# Patient Record
Sex: Female | Born: 1975 | Race: White | Hispanic: No | Marital: Married | Smoking: Never smoker
Health system: Southern US, Community
[De-identification: ages and names within clinical notes are randomized; demographics above are authoritative.]

## PROBLEM LIST (undated history)

## (undated) ENCOUNTER — Ambulatory Visit

## (undated) DIAGNOSIS — E538 Deficiency of other specified B group vitamins: Secondary | ICD-10-CM

## (undated) DIAGNOSIS — K219 Gastro-esophageal reflux disease without esophagitis: Secondary | ICD-10-CM

## (undated) DIAGNOSIS — I2585 Chronic coronary microvascular dysfunction: Secondary | ICD-10-CM

## (undated) DIAGNOSIS — G4730 Sleep apnea, unspecified: Secondary | ICD-10-CM

## (undated) DIAGNOSIS — I519 Heart disease, unspecified: Secondary | ICD-10-CM

## (undated) DIAGNOSIS — R011 Cardiac murmur, unspecified: Secondary | ICD-10-CM

## (undated) DIAGNOSIS — G43909 Migraine, unspecified, not intractable, without status migrainosus: Secondary | ICD-10-CM

## (undated) DIAGNOSIS — I509 Heart failure, unspecified: Secondary | ICD-10-CM

## (undated) DIAGNOSIS — J90 Pleural effusion, not elsewhere classified: Secondary | ICD-10-CM

## (undated) DIAGNOSIS — F32A Depression, unspecified: Secondary | ICD-10-CM

## (undated) DIAGNOSIS — G47419 Narcolepsy without cataplexy: Secondary | ICD-10-CM

## (undated) DIAGNOSIS — T7840XA Allergy, unspecified, initial encounter: Secondary | ICD-10-CM

## (undated) DIAGNOSIS — B019 Varicella without complication: Secondary | ICD-10-CM

## (undated) DIAGNOSIS — I10 Essential (primary) hypertension: Secondary | ICD-10-CM

## (undated) DIAGNOSIS — D649 Anemia, unspecified: Secondary | ICD-10-CM

## (undated) DIAGNOSIS — E079 Disorder of thyroid, unspecified: Secondary | ICD-10-CM

## (undated) DIAGNOSIS — R911 Solitary pulmonary nodule: Secondary | ICD-10-CM

## (undated) DIAGNOSIS — C801 Malignant (primary) neoplasm, unspecified: Secondary | ICD-10-CM

## (undated) DIAGNOSIS — G709 Myoneural disorder, unspecified: Secondary | ICD-10-CM

## (undated) DIAGNOSIS — Z8542 Personal history of malignant neoplasm of other parts of uterus: Secondary | ICD-10-CM

## (undated) DIAGNOSIS — I2589 Other forms of chronic ischemic heart disease: Secondary | ICD-10-CM

## (undated) DIAGNOSIS — G4733 Obstructive sleep apnea (adult) (pediatric): Secondary | ICD-10-CM

## (undated) DIAGNOSIS — F419 Anxiety disorder, unspecified: Secondary | ICD-10-CM

## (undated) DIAGNOSIS — F329 Major depressive disorder, single episode, unspecified: Secondary | ICD-10-CM

## (undated) HISTORY — DX: Pleural effusion, not elsewhere classified: J90

## (undated) HISTORY — PX: ABDOMINAL HYSTERECTOMY: SHX81

## (undated) HISTORY — DX: Solitary pulmonary nodule: R91.1

## (undated) HISTORY — DX: Migraine, unspecified, not intractable, without status migrainosus: G43.909

## (undated) HISTORY — DX: Heart disease, unspecified: I51.9

## (undated) HISTORY — DX: Chronic coronary microvascular dysfunction: I25.85

## (undated) HISTORY — DX: Depression, unspecified: F32.A

## (undated) HISTORY — DX: Major depressive disorder, single episode, unspecified: F32.9

## (undated) HISTORY — DX: Myoneural disorder, unspecified: G70.9

## (undated) HISTORY — DX: Anemia, unspecified: D64.9

## (undated) HISTORY — DX: Allergy, unspecified, initial encounter: T78.40XA

## (undated) HISTORY — DX: Essential (primary) hypertension: I10

## (undated) HISTORY — DX: Varicella without complication: B01.9

## (undated) HISTORY — DX: Heart failure, unspecified: I50.9

## (undated) HISTORY — DX: Cardiac murmur, unspecified: R01.1

## (undated) HISTORY — PX: CHOLECYSTECTOMY: SHX55

## (undated) HISTORY — DX: Obstructive sleep apnea (adult) (pediatric): G47.33

## (undated) HISTORY — DX: Other forms of chronic ischemic heart disease: I25.89

## (undated) HISTORY — DX: Narcolepsy without cataplexy: G47.419

## (undated) HISTORY — DX: Deficiency of other specified B group vitamins: E53.8

## (undated) HISTORY — DX: Malignant (primary) neoplasm, unspecified: C80.1

## (undated) HISTORY — DX: Sleep apnea, unspecified: G47.30

## (undated) HISTORY — DX: Gastro-esophageal reflux disease without esophagitis: K21.9

## (undated) HISTORY — DX: Personal history of malignant neoplasm of other parts of uterus: Z85.42

## (undated) HISTORY — DX: Disorder of thyroid, unspecified: E07.9

## (undated) HISTORY — DX: Anxiety disorder, unspecified: F41.9

---

## 2013-04-22 DIAGNOSIS — C541 Malignant neoplasm of endometrium: Secondary | ICD-10-CM

## 2013-04-22 HISTORY — DX: Malignant neoplasm of endometrium: C54.1

## 2013-04-22 HISTORY — PX: ABDOMINAL HYSTERECTOMY: SHX81

## 2015-02-01 ENCOUNTER — Ambulatory Visit (INDEPENDENT_AMBULATORY_CARE_PROVIDER_SITE_OTHER): Payer: 59 | Admitting: Family Medicine

## 2015-02-01 ENCOUNTER — Encounter: Payer: Self-pay | Admitting: Family Medicine

## 2015-02-01 VITALS — BP 122/78 | HR 76 | Ht 66.75 in | Wt 287.8 lb

## 2015-02-01 DIAGNOSIS — F329 Major depressive disorder, single episode, unspecified: Secondary | ICD-10-CM | POA: Diagnosis not present

## 2015-02-01 DIAGNOSIS — Z803 Family history of malignant neoplasm of breast: Secondary | ICD-10-CM

## 2015-02-01 DIAGNOSIS — Z9989 Dependence on other enabling machines and devices: Secondary | ICD-10-CM

## 2015-02-01 DIAGNOSIS — Z7689 Persons encountering health services in other specified circumstances: Secondary | ICD-10-CM

## 2015-02-01 DIAGNOSIS — F32A Depression, unspecified: Secondary | ICD-10-CM

## 2015-02-01 DIAGNOSIS — Z7189 Other specified counseling: Secondary | ICD-10-CM | POA: Diagnosis not present

## 2015-02-01 DIAGNOSIS — Z23 Encounter for immunization: Secondary | ICD-10-CM | POA: Diagnosis not present

## 2015-02-01 DIAGNOSIS — Z1239 Encounter for other screening for malignant neoplasm of breast: Secondary | ICD-10-CM | POA: Diagnosis not present

## 2015-02-01 DIAGNOSIS — G4733 Obstructive sleep apnea (adult) (pediatric): Secondary | ICD-10-CM | POA: Diagnosis not present

## 2015-02-01 MED ORDER — ARMODAFINIL 250 MG PO TABS: 250.0000 mg | ORAL_TABLET | Freq: Every day | ORAL | Status: DC

## 2015-02-01 NOTE — Progress Notes (Signed)
   Subjective:    Patient ID: Jennifer Mayo, female    DOB: [birth year], 39 y.o.   MRN: [id number]  HPI She is new to the practice and here to establish primary care. She moved here from [place]. She has multiple complaints. Her husband is with her today and he has concerns about her health as well.  She states she has been seeing cardiologist in [place] and has had several cardiac MRIs and a cardiac cath in past 2 years. 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 She states she is unable to walk long distances or she has chest pain and DOE, this is ongoing. Reports she has gained 75 lbs in past due to this since she cannot exercise. She was in a study in [facility] for this. States she would like to be referred to a cardiologist who knows about microvascular CAD.  History of endometrial cancer and hysterectomy. States she still has left ovary.  Reports a "spot on her lung" that was not followed up on. History of thyroid disease and she took synthroid for a while but has not needed it in past 6 months. Family history (mother) of breast cancer.  Reports sleep apnea and uses CPAP nightly. States she takes Nuvigil for either narcolepsy or OSA related daytime sleepiness. She states she does not know what her official diagnosis is.   Reviewed allergies, medications, past medical, surgical, family, and social history.    Review of Systems Pertinent positives and negatives in HPI.    Objective:   Physical Exam  Alert and oriented and in no distress. Not otherwise examined.       Assessment & Plan:  OSA on CPAP  Encounter to establish care  Depression  Screening for breast cancer - Plan: MM DIGITAL SCREENING BILATERAL  FH: breast cancer in first degree relative  Morbid obesity, unspecified obesity type ([place])  Need for prophylactic vaccination and inoculation against influenza - Plan: Flu Vaccine QUAD 36+ mos IM  Discussed that I need to receive documentation from her  previous providers in [place] before being able to continue prescribing Nuvigil. I gave her a prescription today for #10 and will not refill without documentation. Discussed that she has multiple requests to see specialists and a complicated history that I will need time to review once I receive her records. Order placed for a screening mammogram today and she will call the breast center to schedule. Flu shot given.  Depression- states she is ok with her medication for this for now. Will discuss more at next visit. Will also address weight loss at next visit.  Spent at least 45 minutes with patient and at least 50% was counseling and coordination of care.

## 2015-02-06 ENCOUNTER — Telehealth: Payer: Self-pay | Admitting: Family Medicine

## 2015-02-06 NOTE — Telephone Encounter (Signed)
Records recv'd from [facility]

## 2015-02-13 ENCOUNTER — Encounter: Payer: Self-pay | Admitting: Family Medicine

## 2015-02-13 DIAGNOSIS — E538 Deficiency of other specified B group vitamins: Secondary | ICD-10-CM | POA: Insufficient documentation

## 2015-02-13 DIAGNOSIS — G47419 Narcolepsy without cataplexy: Secondary | ICD-10-CM | POA: Insufficient documentation

## 2015-02-13 DIAGNOSIS — I519 Heart disease, unspecified: Secondary | ICD-10-CM | POA: Insufficient documentation

## 2015-02-13 DIAGNOSIS — R911 Solitary pulmonary nodule: Secondary | ICD-10-CM | POA: Insufficient documentation

## 2015-02-13 DIAGNOSIS — G4733 Obstructive sleep apnea (adult) (pediatric): Secondary | ICD-10-CM | POA: Insufficient documentation

## 2015-02-14 ENCOUNTER — Telehealth: Payer: Self-pay | Admitting: Internal Medicine

## 2015-02-14 DIAGNOSIS — G47419 Narcolepsy without cataplexy: Secondary | ICD-10-CM

## 2015-02-14 DIAGNOSIS — G4733 Obstructive sleep apnea (adult) (pediatric): Secondary | ICD-10-CM

## 2015-02-14 NOTE — Telephone Encounter (Signed)
Jennifer Mayo asked me to refer pt over to a Doctor that would control pt's narcolepsy and OSA with cpap and i called [facility] and they said they would that to send a referral over. I have put the refferal into epic and they will call patient. Called and left a message for pt to call me back. (just to let her know that we are sending her to specialist to handle her narcolepsy med and OSA.

## 2015-02-15 NOTE — Telephone Encounter (Signed)
Spoke with patient to let her know what we sent a referral over to neurology so they can control her narcolepsy and sleep apnea as she is on meds for narcolepsy. Patient states that was under control and that was not her main concern as the cardiologist appt was. Pt will call to make an appt with cardiologist Dr. Einar Gip (a friend told her about him) and once she got that going she would look into going to neurology.   I am leaving referral into workqueue for pt and if they call her then she can make that decision if she wants to be seen by them now or not but we will not handle her meds for narcolepsy.

## 2015-02-21 ENCOUNTER — Encounter: Payer: Self-pay | Admitting: Family Medicine

## 2015-03-15 ENCOUNTER — Ambulatory Visit
Admission: RE | Admit: 2015-03-15 | Discharge: 2015-03-15 | Disposition: A | Discharge status: Home / Self Care | Payer: 59 | Source: Ambulatory Visit | Attending: Family Medicine | Admitting: Family Medicine

## 2015-03-15 ENCOUNTER — Ambulatory Visit (INDEPENDENT_AMBULATORY_CARE_PROVIDER_SITE_OTHER): Payer: 59 | Admitting: Neurology

## 2015-03-15 ENCOUNTER — Telehealth: Payer: Self-pay | Admitting: Neurology

## 2015-03-15 ENCOUNTER — Encounter: Payer: Self-pay | Admitting: Neurology

## 2015-03-15 VITALS — BP 118/82 | HR 84 | Resp 20 | Ht 66.0 in | Wt 289.0 lb

## 2015-03-15 DIAGNOSIS — Z9989 Dependence on other enabling machines and devices: Secondary | ICD-10-CM

## 2015-03-15 DIAGNOSIS — Z1239 Encounter for other screening for malignant neoplasm of breast: Secondary | ICD-10-CM

## 2015-03-15 DIAGNOSIS — E662 Morbid (severe) obesity with alveolar hypoventilation: Secondary | ICD-10-CM | POA: Diagnosis not present

## 2015-03-15 DIAGNOSIS — G4719 Other hypersomnia: Secondary | ICD-10-CM

## 2015-03-15 DIAGNOSIS — I5040 Unspecified combined systolic (congestive) and diastolic (congestive) heart failure: Secondary | ICD-10-CM | POA: Diagnosis not present

## 2015-03-15 DIAGNOSIS — G4733 Obstructive sleep apnea (adult) (pediatric): Secondary | ICD-10-CM

## 2015-03-15 MED ORDER — ARMODAFINIL 250 MG PO TABS: 250.0000 mg | ORAL_TABLET | Freq: Every day | ORAL | Status: DC

## 2015-03-15 NOTE — Telephone Encounter (Signed)
Patient is calling. She was seen in our office today and was given a Rx for Armodafinil (NUVIGIL) 250 MG tablet. The patient states the pharmacy says the medication needs prior authorization.form her insurance company. Thank you.

## 2015-03-15 NOTE — Progress Notes (Signed)
SLEEP MEDICINE CLINIC   Provider:  Larey Seat, M D  Referring Provider: Girtha Rm, NP Primary Care Physician:  Bonnita Nasuti, MD  Chief Complaint  Patient presents with  . New Patient (Initial Visit)    osa on cpap, on nuvigil, rm 35, with husband    HPI:  Jennifer Mayo is a 39 y.o. female , seen here as a referral from NP [facility] for a  sleep consultation, Jennifer Mayo was diagnosed with obstructive sleep apnea about 12 years ago. She has been compliant with CPAP for over a decade.  She had been seeing a cardiologist in [place] and has had several cardiac MRIs and a cardiac cath in the past 2 years. She was diagnosed as congestive heart failure stage II microvascular coronary artery disease and stable angina. This has limited her exercise tolerance significantly and she has gained weight over the last 2 years. She is short of breath usually walking distances less than 100 yards. She has also a history of endometrial cancer followed by a hysterectomy she still has the left ovary remaining. She has a history of hypothyroidism but over the last 8 months had not needed to take Synthroid.  Her sleep evaluation 12 years ago was independent of any cardiac history since it wasn't known them. She has used her CPAP nightly but she also has taking Nuvigil for remaining daytime fatigue. She got her last supplies for CPAP from [place] but the head gear is no longer working for her. And because of this ill fitting interface she has been unable to use her CPAP as she usually would. She has an average AHI of 1.8 with a CPAP setting of 11 cm water. She has used the machine about 60% compliant which is unusual for her. I would like to prescribe her new supplies so that she can resume compliant use of CPAP. We were able to get a download from her machine here in the office today and discuss the numbers.  Sleep habits are as follows: The marital bedroom is cool, quiet and dark. The patient tries to get about  8 hours of sleep and usually goes to bed by 10 PM. She falls asleep promptly. She sleeps through the night, rarely has any nocturia. She may wake up once or twice but not because of the urge to urinate. There is also no pain or air hungriness that wakes her. She shares a bedroom with her husband who has not reported any periodic limb movements kicking thrashing acting out dreams etc. She sleeps with the head elevated on 2 pillows, prefer sleeping on her left side. There is breakthrough snoring if she resumes a supine position. She has to arise at about 6 AM but often sleeps through the alarm. She wakes up with headaches frequently these are throbbing dull headaches. There is a concern that these are hypoxemia related headaches or may be hypercapnia. She has never experienced nocturnal clusters or been woken by a headache attack.    Sleep medical history and family sleep history: diagnosed with OSA 2004, in [place]. CHF diagnosed in [place].   Social history: married, non smoker, non drinker, one cup of coffee twice a week.   Review of Systems: Out of a complete 14 system review, the patient complains of only the following symptoms, and all other reviewed systems are negative.  The patient endorsed the Epworth score at 15 points this has to be looked at in context with her CPAP use currently. She is using  Nuvigil in daytime and will need refills. She is using no other stimulant medications. Her fatigue severity score was 58 which can be related to her cardiac disease. Review of systems otherwise sleeping snoring, some memory loss, headaches, dizziness, syncope, depression, decreased level of energy, aching muscles, flushing, blurred vision, chest pain and swelling in the legs. Weight gain.    Social History   Social History  . Marital Status: Married    Spouse Name: N/A  . Number of Children: N/A  . Years of Education: N/A   Occupational History  . Not on file.   Social History Main Topics    . Smoking status: Never Smoker   . Smokeless tobacco: Not on file  . Alcohol Use: No  . Drug Use: No  . Sexual Activity: Not on file   Other Topics Concern  . Not on file   Social History Narrative   One cup of caffeine daily.    Family History  Problem Relation Age of Onset  . Cancer Mother   . Brain cancer Mother   . Seizures Mother   . Stroke Mother   . Diabetes Father     Past Medical History  Diagnosis Date  . History of endometrial cancer     had hysterectomy in August 2015  . Thyroid disease   . Depression   . Obstructive sleep apnea   . Mild diastolic dysfunction   . Anemia   . Cancer [facility])     endometrial  . Solitary pulmonary nodule on lung CT     follow up CT recommended per medical records  . Narcolepsy   . Vitamin B 12 deficiency   . Cardiac microvascular disease ([place])   . CHF (congestive heart failure) ([place])   . Pleural effusion, bilateral     Past Surgical History  Procedure Laterality Date  . Cholecystectomy    . Abdominal hysterectomy      Current Outpatient Prescriptions  Medication Sig Dispense Refill  . Armodafinil (NUVIGIL) 250 MG tablet Take 1 tablet (250 mg total) by mouth daily. 10 tablet 0  . aspirin 81 MG tablet Take 81 mg by mouth daily.    . carvedilol (COREG) 6.25 MG tablet Take 6.25 mg by mouth 2 (two) times daily with a meal.    . Cholecalciferol (VITAMIN D3) 5000 UNITS CAPS Take by mouth.    . citalopram (CELEXA) 20 MG tablet Take 40 mg by mouth daily.     . Magnesium 400 MG CAPS Take 1 tablet by mouth 2 (two) times daily.    . Probiotic Product (PROBIOTIC DAILY PO) Take 1 tablet by mouth daily.    Marland Kitchen Specialty Vitamins Products (MAGNESIUM, AMINO ACID CHELATE,) 133 MG tablet Take 1 tablet by mouth 2 (two) times daily.    Marland Kitchen spironolactone (ALDACTONE) 50 MG tablet Take 50 mg by mouth daily.    . TURMERIC PO Take 1 tablet by mouth 2 (two) times daily.    . vitamin B-12 (CYANOCOBALAMIN) 1000 MCG tablet Take 1,000 mcg by mouth  daily.     No current facility-administered medications for this visit.    Allergies as of 03/15/2015 - Review Complete 03/15/2015  Allergen Reaction Noted  . Ranexa [ranolazine]  03/15/2015  . Verapamil Other (See Comments) 02/01/2015    Vitals: There were no vitals taken for this visit. Last Weight:  Wt Readings from Last 1 Encounters:  02/01/15 287 lb 12.8 oz (130.545 kg)   [id number] is no weight on file to calculate  BMI.     Last Height:   Ht Readings from Last 1 Encounters:  02/01/15 5' 6.75" (1.695 m)    Physical exam:  General: The patient is awake, alert and appears not in acute distress. The patient is well groomed. Head: Normocephalic, atraumatic. Neck is supple. Mallampati 3,  neck circumference:17. Nasal airflow unrestricted, TMJ not evident . Retrognathia is not seen.  Cardiovascular:  Regular rate and rhythm, without distended neck veins. Respiratory: Lungs are clear to auscultation. Skin:  Without evidence of edema, or rash Trunk: BMI is elevated.    Neurologic exam : The patient is awake and alert, oriented to place and time.   Memory subjective described as intact.   Attention span & concentration ability appears normal. The described memory loss is poor fatigue related. Speech is fluent,  without dysarthria, dysphonia or aphasia.  Mood and affect are appropriate.  Cranial nerves: Pupils are equal and briskly reactive to light. Funduscopic exam without evidence of pallor or edema.  Extraocular movements  in vertical and horizontal planes intact and without nystagmus. Visual fields by finger perimetry are intact. Hearing to finger rub intact.   Facial sensation intact to fine touch.  Facial motor strength is symmetric and tongue and uvula move midline. Shoulder shrug was symmetrical.   Motor exam:   Normal tone, muscle bulk and symmetric strength in all extremities.  Sensory:  Fine touch, pinprick and vibration were tested in all extremities.  Proprioception tested in the upper extremities was normal.  Gait and station: Patient walks without assistive device and is able unassisted to climb up to the exam table. Strength within normal limits.  Stance is stable and normal.   Deep tendon reflexes: in the  upper and lower extremities are symmetric and intact. Babinski maneuver response is downgoing.  The patient was advised of the nature of the diagnosed sleep disorder , the treatment options and risks for general a health and wellness arising from not treating the condition.  I spent more than 40 minutes of face to face time with the patient. Greater than 50% of time was spent in counseling and coordination of care. We have discussed the diagnosis and differential and I answered the patient's questions.   When the patient was hospitalized it was observed that her oxygen levels at night were low in spite of using CPAP during that stay. I reviewed the patient's CPAP download which is not an accurate representation of her usual use. I would like for Mrs. Delafuente to return for a split night polysomnography. Her cardiac history was not even known by the time her last CPAP machine was issued, about 7 years ago. There has been no change in settings she is currently at 11 cm water which was the titration results of her last sleep study again 7 years ago. In addition her body mass index has increased. It may also be worthwhile to find a better fit fitting mask since many new models have been issued in the meantime. She is using the nasal whisp interface. In a hospitalization, a FFM was used, and she didn't like this.    Assessment:  After physical and neurologic examination, review of laboratory studies,  Personal review of imaging studies, reports of other /same  Imaging studies ,  Results of polysomnography/ neurophysiology testing and pre-existing records as far as provided in visit., my assessment is   1) obstructive sleep apnea diagnosed about 12  years ago currently using a CPAP machine that is about 40 years  old set at 11 cm water pressure. By the patient's residual AHI was still in good range at 1.8, her headgear and supplies have not been fitting her as well. I would like for her to be re-titrated for the simple reason that she has now a cardiac history with : A)congestive heart failure and microvascular ischemic heart disease, B) her elevated body mass index which was not present previously may require a different pressure setting for treatment.  I hope that we will find a comfortable setting for the patient and that in response to her excessive daytime sleepiness may be reduced as well.  C) hypoxemia and morning headaches are closely related.   D)The excessive daytime sleepiness can be related to her cardiac disease and may not be apnea dependent at all.  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   Plan:  Treatment plan and additional workup :  I will order a split night polysomnography, the patient has no primary pulmonary disease, but she wakes up with headaches which could be a sign of hypercapnia or hypoxemia for protracted periods at night.  Asencion Partridge Dennette Faulconer MD  03/15/2015   CC: Girtha Rm, [address] [place], [place] [id number]

## 2015-03-15 NOTE — Patient Instructions (Signed)
Armodafinil tablets What is this medicine? ARMODAFINIL (ar moe DAF i nil) is used to treat excessive sleepiness caused by certain sleep disorders. This includes narcolepsy, sleep apnea, and shift work sleep disorder. This medicine may be used for other purposes; ask your health care provider or pharmacist if you have questions. What should I tell my health care provider before I take this medicine? They need to know if you have any of these conditions: -kidney disease -liver disease -an unusual or allergic reaction to armodafinil, modafinil, medicines, foods, dyes, or preservatives -pregnant or trying to get pregnant -breast-feeding How should I use this medicine? Take this medicine by mouth with a glass of water. Follow the directions on the prescription label. Take your doses at regular intervals. Do not take your medicine more often than directed. Do not stop taking except on your doctor's advice. A special MedGuide will be given to you by the pharmacist with each prescription and refill. Be sure to read this information carefully each time. Talk to your pediatrician regarding the use of this medicine in children. While this drug may be prescribed for children as young as 42 years of age for selected conditions, precautions do apply. Overdosage: If you think you have taken too much of this medicine contact a poison control center or emergency room at once. NOTE: This medicine is only for you. Do not share this medicine with others. What if I miss a dose? If you miss a dose, take it as soon as you can. If it is almost time for your next dose, take only that dose. Do not take double or extra doses. What may interact with this medicine? Do not take this medicine with any of the following medications: -amphetamine or dextroamphetamine -dexmethylphenidate or methylphenidate -MAOIs like Carbex, Eldepryl, Marplan, Nardil, and Parnate -pemoline -procarbazine This medicine may also interact with  the following medications: -antifungal medicines like itraconazole or ketoconazole -barbiturates, like phenobarbital -birth control pills or other hormone-containing birth control devices or implants -carbamazepine -cyclosporine -diazepam -medicines for depression, anxiety, or psychotic disturbances -phenytoin -propranolol -triazolam -warfarin This list may not describe all possible interactions. Give your health care provider a list of all the medicines, herbs, non-prescription drugs, or dietary supplements you use. Also tell them if you smoke, drink alcohol, or use illegal drugs. Some items may interact with your medicine. What should I watch for while using this medicine? Visit your doctor or health care professional for regular checks on your progress. The full effect of this medicine may not be seen right away. This medicine may affect your concentration, function, or may hide signs that you are tired. You may get dizzy. Do not drive, use machinery, or do anything that needs mental alertness until you know how this drug affects you. Alcohol can make you more dizzy and may interfere with your response to this medicine or your alertness. Avoid alcoholic drinks. Birth control pills may not work properly while you are taking this medicine. Talk to your doctor about using an extra method of birth control. It is unknown if the effects of this medicine will be increased by the use of caffeine. Caffeine is found in many foods, beverages, and medications. Ask your doctor if you should limit or change your intake of caffeine-containing products while on this medicine. What side effects may I notice from receiving this medicine? Side effects that you should report to your doctor or health care professional as soon as possible: -allergic reactions like skin rash, itching or hives,  swelling of the face, lips, or tongue -breathing problems -chest pain -fast, irregular heartbeat -increased blood  pressure, particularly if you have high blood pressure -mental problems -sore throat, fever, or chills -tremors -vomiting Side effects that usually do not require medical attention (report to your doctor or health care professional if they continue or are bothersome): -headache -nausea, diarrhea, or stomach upset -nervousness -trouble sleeping This list may not describe all possible side effects. Call your doctor for medical advice about side effects. You may report side effects to FDA at 1-800-FDA-1088. Where should I keep my medicine? Keep out of the reach of children. Store at room temperature between 20 and 25 degrees C (68 and 77 degrees F). Throw away any unused medicine after the expiration date. NOTE: This sheet is a summary. It may not cover all possible information. If you have questions about this medicine, talk to your doctor, pharmacist, or health care provider.    2016, Elsevier/Gold Standard. (2013-12-28 14:57:07)

## 2015-03-15 NOTE — Telephone Encounter (Signed)
Ins has been contacted and provided with clinical info.  Request is currently under review Ref # [id number]

## 2015-03-21 NOTE — Telephone Encounter (Signed)
Patient's husband is calling to see if wife's Jennifer Mayo has been approved.  Thanks!

## 2015-03-21 NOTE — Telephone Encounter (Signed)
I called ins to follow up.  Spoke with Abigail Butts.  She said this request is still under review.    [employer] [facility]) has approved the request for coverage on Armodafinil (Nuvigil) effective until 03/14/2016 Ref # [id number]  I called the patient back to advise.  Got no answer.  Left message.

## 2015-04-11 ENCOUNTER — Ambulatory Visit (INDEPENDENT_AMBULATORY_CARE_PROVIDER_SITE_OTHER): Payer: 59 | Admitting: Neurology

## 2015-04-11 DIAGNOSIS — G4719 Other hypersomnia: Secondary | ICD-10-CM

## 2015-04-11 DIAGNOSIS — Z9989 Dependence on other enabling machines and devices: Secondary | ICD-10-CM

## 2015-04-11 DIAGNOSIS — G4733 Obstructive sleep apnea (adult) (pediatric): Secondary | ICD-10-CM

## 2015-04-11 DIAGNOSIS — E662 Morbid (severe) obesity with alveolar hypoventilation: Secondary | ICD-10-CM

## 2015-04-11 DIAGNOSIS — I5040 Unspecified combined systolic (congestive) and diastolic (congestive) heart failure: Secondary | ICD-10-CM

## 2015-04-12 NOTE — Sleep Study (Signed)
Please see the scanned sleep study interpretation located in the Procedure tab within the Chart Review section. 

## 2015-04-19 ENCOUNTER — Telehealth: Payer: Self-pay

## 2015-04-19 DIAGNOSIS — G4733 Obstructive sleep apnea (adult) (pediatric): Secondary | ICD-10-CM

## 2015-04-19 NOTE — Telephone Encounter (Signed)
Spoke to pt regarding her sleep study results. I advised pt that her sleep study revealed osa with supine accentuation and without hypoxemia. Dr. Brett Fairy recommends starting an auto PAP between 5 and 10 cm H2O with 1 cm H2O EPR. Pt has a CPAP at home, but it is very old. Pt is agreeable to starting new cpap. Will send to [facility]. I advised pt to use the cpap at least four or more hours per night. I advised her to lose weight, diet, and exercise if not contraindicated by her other physicians. I advised pt to avoid driving or operating hazardous machinery while sleepy. Pt verbalized understanding. A follow up appt was made for 3/1 at 2:30 for insurance purposes.

## 2015-06-21 ENCOUNTER — Ambulatory Visit: Payer: Self-pay | Admitting: Neurology

## 2015-06-21 ENCOUNTER — Telehealth: Payer: Self-pay

## 2015-06-21 NOTE — Telephone Encounter (Signed)
Pt did not show for their appt with Dr. Brett Fairy today due to a death in the family.

## 2015-06-22 ENCOUNTER — Encounter: Payer: Self-pay | Admitting: Neurology

## 2015-07-25 ENCOUNTER — Ambulatory Visit (INDEPENDENT_AMBULATORY_CARE_PROVIDER_SITE_OTHER): Payer: 59 | Admitting: Neurology

## 2015-07-25 ENCOUNTER — Telehealth: Payer: Self-pay

## 2015-07-25 ENCOUNTER — Encounter: Payer: Self-pay | Admitting: Neurology

## 2015-07-25 VITALS — BP 130/84 | HR 78 | Resp 20 | Ht 66.0 in | Wt 299.0 lb

## 2015-07-25 DIAGNOSIS — I5032 Chronic diastolic (congestive) heart failure: Secondary | ICD-10-CM | POA: Diagnosis not present

## 2015-07-25 DIAGNOSIS — G4733 Obstructive sleep apnea (adult) (pediatric): Secondary | ICD-10-CM | POA: Diagnosis not present

## 2015-07-25 DIAGNOSIS — G44019 Episodic cluster headache, not intractable: Secondary | ICD-10-CM | POA: Diagnosis not present

## 2015-07-25 DIAGNOSIS — Z9989 Dependence on other enabling machines and devices: Principal | ICD-10-CM

## 2015-07-25 MED ORDER — ARMODAFINIL 250 MG PO TABS: 250.0000 mg | ORAL_TABLET | Freq: Every day | ORAL | Status: DC

## 2015-07-25 NOTE — Telephone Encounter (Signed)
Pt arrived 1 hour late for her appt. She thought that the appt was at 3:30.

## 2015-07-25 NOTE — Progress Notes (Signed)
SLEEP MEDICINE CLINIC   Provider:  Larey Seat, M D  Referring Provider: Raelyn Number, MD Primary Care Physician:  Bonnita Nasuti, MD  Chief Complaint  Patient presents with  . Follow-up    cpap going well, rm 10, alone    HPI:  Jennifer Mayo is a 40 y.o. female , seen here as a referral from [facility] for a sleep consultation, Mrs.Puff was diagnosed with obstructive sleep apnea about 12 years ago. She has been compliant with CPAP for over a decade.  She had been seeing a cardiologist in [place] and has had several cardiac MRIs and a cardiac cath in the past 2 years. She was diagnosed as congestive heart failure stage II microvascular coronary artery disease and stable angina. This has limited her exercise tolerance significantly and she has gained weight over the last 2 years. She is short of breath usually walking distances less than 100 yards. She has also a history of endometrial cancer followed by a hysterectomy she still has the left ovary remaining. She has a history of hypothyroidism but over the last 8 months had not needed to take Synthroid.  Her sleep evaluation 12 years ago was independent of any cardiac history since it wasn't known them. She has used her CPAP nightly but she also has taking Nuvigil for remaining daytime fatigue. She got her last supplies for CPAP from [place] but the head gear is no longer working for her. And because of this ill fitting interface she has been unable to use her CPAP as she usually would. She has an average AHI of 1.8 with a CPAP setting of 11 cm water. She has used the machine about 60% compliant which is unusual for her. I would like to prescribe her new supplies so that she can resume compliant use of CPAP. We were able to get a download from her machine here in the office today and discuss the numbers.  Sleep habits are as follows: The marital bedroom is cool, quiet and dark. The patient tries to get about 8 hours of sleep and usually goes to  bed by 10 PM. She falls asleep promptly. She sleeps through the night, rarely has any nocturia. She may wake up once or twice but not because of the urge to urinate. There is also no pain or air hungriness that wakes her. She shares a bedroom with her husband who has not reported any periodic limb movements kicking thrashing acting out dreams etc. She sleeps with the head elevated on 2 pillows, prefer sleeping on her left side. There is breakthrough snoring if she resumes a supine position. She has to arise at about 6 AM but often sleeps through the alarm. She wakes up with headaches frequently these are throbbing dull headaches. There is a concern that these are hypoxemia related headaches or may be hypercapnia. She has never experienced nocturnal clusters or been woken by a headache attack. When the patient was hospitalized it was observed that her oxygen levels at night were low in spite of using CPAP during that stay. I reviewed the patient's CPAP download which is not an accurate representation of her usual use. I would like for [name] to return for a split night polysomnography. Her cardiac history was not even known by the time her last CPAP machine was issued, about 7 years ago. There has been no change in settings she is currently at 11 cm water which was the titration results of her last sleep  study again 7 years ago. In addition her body mass index has increased. It may also be worthwhile to find a better fit fitting mask since many new models have been issued in the meantime. She is using the nasal whisp interface. In a hospitalization, a FFM was used, and she didn't like this.    07-25-15 I see [name] here today following her sleep study from 04/11/2015. Unfortunately we couldn't meet earlier as her grandfather passed away at the time of her first possible follow-up appointment. As described above Mrs. Pop has a list of comorbidities that make her more prone to have obstructive sleep apnea.  Her sleep study showed an apnea hypopnea index of 19.0 without REM sleep. Interestingly nonsupine AHI was 44.6 which remains poorly explained. The patient was titrated beginning at 5 cm water pressure on CPAP and a pressure of 10 cm was finally explored,  under which the AHI was reduced to 0.0 . She had rebounded into REM sleep. Her sleep efficiency was now 97% and 12.8% was REM sleep.  Several pressure settings between 9, 10 and even 7 appear to be effective her oxygen nadir rose to 90% occasionally there was still breakthrough snoring noted but no arousals were caused.   I ordered an auto titrate her with the with nasal mask. The patient confirms that the mask is comfortable and we have also her first download available today she has used the machine 100% of all days and 97% of those days was over 4 hours of use, average user time is 8 hours and 18 minutes the minimum pressure is 5 cm water and maximum pressure at 10 cm water with 3 cm EPR her residual AHI is 1.5. The 95th percentile pressure is 10 cm water, the very upper limit of her window.    Sleep medical history and family sleep history: diagnosed with OSA 2004, in [place]. CHF diagnosed in [place].  Social history: married, non smoker, non drinker, one cup of coffee twice a week.   Review of Systems: Out of a complete 14 system review, the patient complains of only the following symptoms, and all other reviewed systems are negative.  The patient endorsed the Epworth score at 15 points this has to be looked at in context with her CPAP use currently. She is using Nuvigil in daytime and will need refills. She is using no other stimulant medications. Review of systems otherwise sleeping snoring, some memory loss, headaches, dizziness, syncope, depression, decreased level of energy, aching muscles, flushing, blurred vision, chest pain and swelling in the legs. Weight gain. Nasal congestion. Had woken with cluster headaches, new.  FSS 59 and Epworth  score is 12 , waking with headaches and sometimes with a  dry mouth .   Social History   Social History  . Marital Status: Married    Spouse Name: N/A  . Number of Children: N/A  . Years of Education: N/A   Occupational History  . Not on file.   Social History Main Topics  . Smoking status: Never Smoker   . Smokeless tobacco: Not on file  . Alcohol Use: No  . Drug Use: No  . Sexual Activity: Not on file   Other Topics Concern  . Not on file   Social History Narrative   One cup of caffeine daily.    Family History  Problem Relation Age of Onset  . Cancer Mother   . Brain cancer Mother   . Seizures Mother   . Stroke  Mother   . Diabetes Father     Past Medical History  Diagnosis Date  . History of endometrial cancer     had hysterectomy in August 2015  . Thyroid disease   . Depression   . Obstructive sleep apnea   . Mild diastolic dysfunction   . Anemia   . Cancer [facility])     endometrial  . Solitary pulmonary nodule on lung CT     follow up CT recommended per medical records  . Narcolepsy   . Vitamin B 12 deficiency   . Cardiac microvascular disease ([place])   . CHF (congestive heart failure) ([place])   . Pleural effusion, bilateral     Past Surgical History  Procedure Laterality Date  . Cholecystectomy    . Abdominal hysterectomy      Current Outpatient Prescriptions  Medication Sig Dispense Refill  . Armodafinil (NUVIGIL) 250 MG tablet Take 1 tablet (250 mg total) by mouth daily. 30 tablet 5  . aspirin 81 MG tablet Take 81 mg by mouth daily.    . carvedilol (COREG) 6.25 MG tablet Take 6.25 mg by mouth 2 (two) times daily with a meal.    . Cholecalciferol (VITAMIN D3) 5000 UNITS CAPS Take by mouth.    . citalopram (CELEXA) 40 MG tablet Take 40 mg by mouth daily.    . isosorbide mononitrate (IMDUR) 30 MG 24 hr tablet Take 30 mg by mouth daily.  0  . Magnesium 400 MG CAPS Take 1 tablet by mouth 2 (two) times daily.    . Probiotic Product (PROBIOTIC DAILY PO)  Take 1 tablet by mouth daily.    Marland Kitchen Specialty Vitamins Products (MAGNESIUM, AMINO ACID CHELATE,) 133 MG tablet Take 1 tablet by mouth 2 (two) times daily.    Marland Kitchen spironolactone (ALDACTONE) 50 MG tablet Take 50 mg by mouth daily.    . TURMERIC PO Take 1 tablet by mouth 2 (two) times daily.    . vitamin B-12 (CYANOCOBALAMIN) 1000 MCG tablet Take 1,000 mcg by mouth daily.     No current facility-administered medications for this visit.    Allergies as of 07/25/2015 - Review Complete 07/25/2015  Allergen Reaction Noted  . Ranexa [ranolazine]  03/15/2015  . Verapamil Other (See Comments) 02/01/2015    Vitals: BP 130/84 mmHg  Pulse 78  Resp 20  Ht 5\' 6"  (1.676 m)  Wt 299 lb (135.626 kg)  BMI 48.28 kg/m2 Last Weight:  Wt Readings from Last 1 Encounters:  07/25/15 299 lb (135.626 kg)   [id number] mass index is 48.28 kg/(m^2).     Last Height:   Ht Readings from Last 1 Encounters:  07/25/15 5\' 6"  (1.676 m)    Physical exam:  General: The patient is awake, alert and appears not in acute distress. The patient is well groomed. Head: Normocephalic, atraumatic. Neck is supple. Mallampati 3,  neck circumference:17. Nasal airflow unrestricted, TMJ not evident . Retrognathia is not seen.  Cardiovascular:  Regular rate and rhythm, without distended neck veins. Respiratory: Lungs are clear to auscultation. Skin:  Without evidence of edema, or rash Trunk: BMI is elevated.    Cranial nerves: sense of taste and smell is intact.  Pupils are equal and briskly reactive to light. Funduscopic exam without evidence of pallor or edema.  Extraocular movements  in vertical and horizontal planes intact and without nystagmus. Visual fields by finger perimetry are intact. Hearing to finger rub intact.   Facial sensation intact to fine touch.  Facial motor strength is  symmetric and tongue and uvula move midline. Shoulder shrug was symmetrical.    The patient was advised of the nature of the diagnosed sleep  disorder , the treatment options and risks for general a health and wellness arising from not treating the condition.  I spent more than  20 minutes of face to face time with the patient. Greater than 50% of time was spent in counseling and coordination of care. We have discussed the diagnosis and differential and I answered the patient's questions.     Assessment:  After physical and neurologic examination, review of laboratory studies,  Personal review of imaging studies, reports of other /same  Imaging studies ,  Results of polysomnography/ neurophysiology testing and pre-existing records as far as provided in visit., my assessment is   1) obstructive sleep apnea re-diagnosed , known for about 12 years, AHI was 19,    A)congestive heart failure and microvascular ischemic heart disease, B) her elevated body mass index which  Related better to auto PAP, i will increase the upper pressure to 12 cm water. I hope that we will find a comfortable setting for the patient and that in response to her excessive daytime sleepiness may be reduced as well.   C) hypoxemia and morning headaches are closely related. ONO on CPAP ordered.   D)The excessive daytime sleepiness can be related to her cardiac disease and may not be apnea dependent at all.  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   Asencion Partridge Melenie Minniear MD  07/25/2015   CC: Raelyn Number, Md [address] [place], [place] [address]

## 2015-07-25 NOTE — Addendum Note (Signed)
Addended by: Larey Seat on: 07/25/2015 04:56 PM   Modules accepted: Orders

## 2015-07-25 NOTE — Telephone Encounter (Signed)
Pt did not show for their appt with Dr. Dohmeier today.  

## 2015-07-28 ENCOUNTER — Telehealth: Payer: Self-pay

## 2015-07-28 NOTE — Telephone Encounter (Signed)
Jennifer Mayo, please disregard previous msg on this patient.  I found Jennifer Mayo with [employer].

## 2015-07-28 NOTE — Telephone Encounter (Signed)
Dolan Amen with [facility] is calling and asking for [employer]  They were given Dawn's number and said Jobe Igo is requesting authorization for medical supplies for this patient.  [place]'s number is [phone]

## 2015-08-24 ENCOUNTER — Telehealth: Payer: Self-pay

## 2015-08-24 NOTE — Telephone Encounter (Signed)
Received this notice from [employer]: "We received an order for ONO on this patient. We have been unable to get in touch with her. Also, her insurance has ran out and we have tried to make contact with her in order to continue to provide Cpap as it is still a rental. If you have new insurance for her please fax it to me and also if you contact her will you please have her call me in refrence to her ONO."  I called pt to discuss. No answer, left a message asking her to call me back.

## 2015-10-06 NOTE — Telephone Encounter (Signed)
Pt's husband returned Jennifer Mayo's call. 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 Operator read message to him. He said he will relay msg to pt and call [employer].

## 2015-11-21 NOTE — Telephone Encounter (Signed)
I contacted [employer]. Pt still has not called [employer] back to schedule her ONO.

## 2015-11-23 ENCOUNTER — Ambulatory Visit: Payer: 59 | Admitting: Neurology

## 2015-11-24 ENCOUNTER — Encounter: Payer: Self-pay | Admitting: Neurology

## 2016-01-24 ENCOUNTER — Other Ambulatory Visit: Payer: Self-pay | Admitting: Neurology

## 2016-01-24 DIAGNOSIS — Z9989 Dependence on other enabling machines and devices: Principal | ICD-10-CM

## 2016-01-24 DIAGNOSIS — G4733 Obstructive sleep apnea (adult) (pediatric): Secondary | ICD-10-CM

## 2016-01-24 DIAGNOSIS — G44019 Episodic cluster headache, not intractable: Secondary | ICD-10-CM

## 2016-01-24 DIAGNOSIS — I5032 Chronic diastolic (congestive) heart failure: Secondary | ICD-10-CM

## 2016-01-25 NOTE — Telephone Encounter (Signed)
RX for nuvigil faxed to [facility]. Received a receipt of confirmation.

## 2016-02-07 ENCOUNTER — Other Ambulatory Visit (HOSPITAL_COMMUNITY): Payer: Self-pay | Admitting: General Surgery

## 2016-02-13 ENCOUNTER — Ambulatory Visit (HOSPITAL_COMMUNITY)
Admission: RE | Admit: 2016-02-13 | Discharge: 2016-02-13 | Disposition: A | Discharge status: Home / Self Care | Payer: 59 | Source: Ambulatory Visit | Attending: General Surgery | Admitting: General Surgery

## 2016-02-13 ENCOUNTER — Other Ambulatory Visit (HOSPITAL_COMMUNITY): Payer: Self-pay | Admitting: General Surgery

## 2016-02-13 ENCOUNTER — Other Ambulatory Visit: Payer: Self-pay

## 2016-02-13 DIAGNOSIS — K219 Gastro-esophageal reflux disease without esophagitis: Secondary | ICD-10-CM | POA: Insufficient documentation

## 2016-02-13 DIAGNOSIS — R918 Other nonspecific abnormal finding of lung field: Secondary | ICD-10-CM | POA: Insufficient documentation

## 2016-02-13 DIAGNOSIS — K449 Diaphragmatic hernia without obstruction or gangrene: Secondary | ICD-10-CM | POA: Diagnosis not present

## 2016-02-22 ENCOUNTER — Encounter: Payer: 59 | Attending: General Surgery | Admitting: Dietician

## 2016-02-22 DIAGNOSIS — Z713 Dietary counseling and surveillance: Secondary | ICD-10-CM | POA: Insufficient documentation

## 2016-02-22 NOTE — Progress Notes (Signed)
  Pre-Op Assessment Visit:  Pre-Operative RYGB Surgery  Medical Nutrition Therapy:  Appt start time: 410   End time:  440.  Patient was seen on 02/22/2016 for Pre-Operative Nutrition Assessment. Assessment and letter of approval faxed to [facility] Surgery Bariatric Surgery Program coordinator on 02/22/2016.   Preferred Learning Style:   No preference indicated   Learning Readiness:   Ready  Handouts given during visit include:  Pre-Op Goals Bariatric Surgery Protein Shakes   During the appointment today the following Pre-Op Goals were reviewed with the patient: Maintain or lose weight as instructed by your surgeon Make healthy food choices Begin to limit portion sizes Limited concentrated sugars and fried foods Keep fat/sugar in the single digits per serving on   food labels Practice CHEWING your food  (aim for 30 chews per bite or until applesauce consistency) Practice not drinking 15 minutes before, during, and 30 minutes after each meal/snack Avoid all carbonated beverages  Avoid/limit caffeinated beverages  Avoid all sugar-sweetened beverages Consume 3 meals per day; eat every 3-5 hours Make a list of non-food related activities Aim for 64-100 ounces of FLUID daily  Aim for at least 60-80 grams of PROTEIN daily Look for a liquid protein source that contain ?15 g protein and ?5 g carbohydrate  (ex: shakes, drinks, shots)  Patient-Centered Goals: Be able to start exercise, improving chest pain  10 level confidence  Demonstrated degree of understanding via:  Teach Back  Teaching Method Utilized:  Visual Auditory Hands on  Barriers to learning/adherence to lifestyle change: none  Patient to call the [facility] to enroll in Pre-Op and Post-Op Nutrition Education when surgery date is scheduled.

## 2016-02-22 NOTE — Patient Instructions (Signed)
Follow Pre-Op Goals Try Protein Shakes Call [facility] at [phone] when surgery is scheduled to enroll in Pre-Op Class  Things to remember:  Please always be honest with us. We want to support you!  If you have any questions or concerns in between appointments, please call or email Liz, Leslie, or Laurie.  The diet after surgery will be high protein and low in carbohydrate.  Vitamins and calcium need to be taken for the rest of your life.  Feel free to include support people in any classes or appointments.   Supplement recommendations:  Before Surgery   1 Complete Multivitamin with Iron  3000 IU Vitamin D3  After Surgery   2 Chewable Multivitamins  **Best Choice - Bariatric Advantage Advanced Multi EA      3 Chewable Calcium (500 mg each, total 1200-1500 mg per day)  **Best Choice - Celebrate, Bariatric Advantage, or Wellesse  Other Options:    2 Flinstones Complete + up to 100 mg Thiamin + 2000-3000 IU Vitamin D3 + 350-500 mcg Vitamin B12 + 30-45 mg Iron (with history of deficiency)  2 Celebrate MultiComplete with 18 mg Iron (this provides 6000 IU of  Vitamin D3)  4 Celebrate Essential Multi 2 in 1 (has calcium) + 18-60 mg separate  iron  Vitamins and Calcium are available at:   [facility]   [address], , [place] [address]   [email or web address]  [email or web address]  [email or web address]   

## 2016-05-27 ENCOUNTER — Ambulatory Visit: Payer: 59

## 2016-06-03 ENCOUNTER — Encounter: Payer: 59 | Attending: General Surgery | Admitting: Dietician

## 2016-06-03 DIAGNOSIS — Z713 Dietary counseling and surveillance: Secondary | ICD-10-CM | POA: Insufficient documentation

## 2016-06-05 ENCOUNTER — Encounter: Payer: Self-pay | Admitting: Dietician

## 2016-06-05 NOTE — Progress Notes (Signed)
  Pre-Operative Nutrition Class:  Appt start time: 3010   End time:  1830.  Patient was seen on 06/03/2016 for Pre-Operative Bariatric Surgery Education at the [facility].   Surgery date: 07/01/2016 Surgery type: RYGB Start weight at [facility]: 314 lbs on 02/22/2016 Weight today: 312.2 lbs  TANITA  BODY COMP RESULTS  06/03/16   BMI (kg/m^2) 52.8   Fat Mass (lbs) 179.6   Fat Free Mass (lbs) 132.6   Total Body Water (lbs) 100   Samples given per MNT protocol. Patient educated on appropriate usage: Premier protein shake (vanilla - qty 1) Lot #: [id number] Exp: 03/2017  Bariatric Advantage Calcium Citrate chew (lemon - qty 1) Lot #: [id number] Exp: 09/2016  Renee Pain Protein Powder (unflavored - qty 1) Lot #: [id number] Exp: 09/2017  The following the learning objectives were met by the patient during this course:  Identify Pre-Op Dietary Goals and will begin 2 weeks pre-operatively  Identify appropriate sources of fluids and proteins   State protein recommendations and appropriate sources pre and post-operatively  Identify Post-Operative Dietary Goals and will follow for 2 weeks post-operatively  Identify appropriate multivitamin and calcium sources  Describe the need for physical activity post-operatively and will follow MD recommendations  State when to call healthcare provider regarding medication questions or post-operative complications  Handouts given during class include:  Pre-Op Bariatric Surgery Diet Handout  Protein Shake Handout  Post-Op Bariatric Surgery Nutrition Handout  [facility] Program Information Flyer  Support Group Information Flyer  [facility] Bariatric Supplements Price List  Follow-Up Plan: Patient will follow-up at [facility] 2 weeks post operatively for diet advancement per MD.

## 2016-06-12 ENCOUNTER — Ambulatory Visit: Payer: Self-pay | Admitting: General Surgery

## 2016-06-19 NOTE — Patient Instructions (Addendum)
Jennifer Mayo  06/19/2016   Your procedure is scheduled on: 07-01-16  Report to [facility]  Entrance take [facility]  elevators to 3rd floor to  [place] at 515 AM.  Call this number if you have problems the morning of surgery [phone]   Remember: ONLY 1 PERSON MAY GO WITH YOU TO SHORT STAY TO GET  READY MORNING OF [place].  Do not eat food or drink liquids :After Midnight.    Call Dr Kinsinger's office to verify if you have to complete a bowel prep prior to surgery.     Take these medicines the morning of surgery with A SIP OF WATER: LEVOTHYROXINE (SYNTHROID)              You may not have any metal on your body including hair pins and              piercings  Do not wear jewelry, make-up, lotions, powders or perfumes, deodorant             Do not wear nail polish.  Do not shave  48 hours prior to surgery.              Men may shave face and neck.   Do not bring valuables to the hospital. [place].  Contacts, dentures or bridgework may not be worn into surgery.  Leave suitcase in the car. After surgery it may be brought to your room.      Please read over the following fact sheets you were given: _____________________________________________________________________             [facility] - Preparing for Surgery Before surgery, you can play an important role.  Because skin is not sterile, your skin needs to be as free of germs as possible.  You can reduce the number of germs on your skin by washing with CHG (chlorahexidine gluconate) soap before surgery.  CHG is an antiseptic cleaner which kills germs and bonds with the skin to continue killing germs even after washing. Please DO NOT use if you have an allergy to CHG or antibacterial soaps.  If your skin becomes reddened/irritated stop using the CHG and inform your nurse when you arrive at Short Stay. Do not shave (including legs and underarms)  for at least 48 hours prior to the first CHG shower.  You may shave your face/neck. Please follow these instructions carefully:  1.  Shower with CHG Soap the night before surgery and the  morning of Surgery.  2.  If you choose to wash your hair, wash your hair first as usual with your  normal  shampoo.  3.  After you shampoo, rinse your hair and body thoroughly to remove the  shampoo.                           4.  Use CHG as you would any other liquid soap.  You can apply chg directly  to the skin and wash                       Gently with a scrungie or clean washcloth.  5.  Apply the CHG Soap to your body ONLY FROM THE NECK DOWN.  Do not use on face/ open                           Wound or open sores. Avoid contact with eyes, ears mouth and genitals (private parts).                       Wash face,  Genitals (private parts) with your normal soap.             6.  Wash thoroughly, paying special attention to the area where your surgery  will be performed.  7.  Thoroughly rinse your body with warm water from the neck down.  8.  DO NOT shower/wash with your normal soap after using and rinsing off  the CHG Soap.                9.  Pat yourself dry with a clean towel.            10.  Wear clean pajamas.            11.  Place clean sheets on your bed the night of your first shower and do not  sleep with pets. Day of Surgery : Do not apply any lotions/deodorants the morning of surgery.  Please wear clean clothes to the hospital/surgery center.  FAILURE TO FOLLOW THESE INSTRUCTIONS MAY RESULT IN THE CANCELLATION OF YOUR SURGERY PATIENT SIGNATURE_________________________________  NURSE SIGNATURE__________________________________  ________________________________________________________________________   Adam Phenix  An incentive spirometer is a tool that can help keep your lungs clear and active. This tool measures how well you are filling your lungs with each breath. Taking long deep  breaths may help reverse or decrease the chance of developing breathing (pulmonary) problems (especially infection) following:  A long period of time when you are unable to move or be active. BEFORE THE PROCEDURE   If the spirometer includes an indicator to show your best effort, your nurse or respiratory therapist will set it to a desired goal.  If possible, sit up straight or lean slightly forward. Try not to slouch.  Hold the incentive spirometer in an upright position. INSTRUCTIONS FOR USE  1. Sit on the edge of your bed if possible, or sit up as far as you can in bed or on a chair. 2. Hold the incentive spirometer in an upright position. 3. Breathe out normally. 4. Place the mouthpiece in your mouth and seal your lips tightly around it. 5. Breathe in slowly and as deeply as possible, raising the piston or the ball toward the top of the column. 6. Hold your breath for 3-5 seconds or for as long as possible. Allow the piston or ball to fall to the bottom of the column. 7. Remove the mouthpiece from your mouth and breathe out normally. 8. Rest for a few seconds and repeat Steps 1 through 7 at least 10 times every 1-2 hours when you are awake. Take your time and take a few normal breaths between deep breaths. 9. The spirometer may include an indicator to show your best effort. Use the indicator as a goal to work toward during each repetition. 10. After each set of 10 deep breaths, practice coughing to be sure your lungs are clear. If you have an incision (the cut made at the time of surgery), support your incision when coughing by placing a pillow or rolled up towels firmly against it. Once you are able to get out of  bed, walk around indoors and cough well. You may stop using the incentive spirometer when instructed by your caregiver.  RISKS AND COMPLICATIONS  Take your time so you do not get dizzy or light-headed.  If you are in pain, you may need to take or ask for pain medication before  doing incentive spirometry. It is harder to take a deep breath if you are having pain. AFTER USE  Rest and breathe slowly and easily.  It can be helpful to keep track of a log of your progress. Your caregiver can provide you with a simple table to help with this. If you are using the spirometer at home, follow these instructions: [place] IF:   You are having difficultly using the spirometer.  You have trouble using the spirometer as often as instructed.  Your pain medication is not giving enough relief while using the spirometer.  You develop fever of 100.5 F (38.1 C) or higher. SEEK IMMEDIATE MEDICAL CARE IF:   You cough up bloody sputum that had not been present before.  You develop fever of 102 F (38.9 C) or greater.  You develop worsening pain at or near the incision site. MAKE SURE YOU:   Understand these instructions.  Will watch your condition.  Will get help right away if you are not doing well or get worse. Document Released: 08/19/2006 Document Revised: 07/01/2011 Document Reviewed: 10/20/2006 [facility] Patient Information 2014 [place], [place].   ________________________________________________________________________

## 2016-06-19 NOTE — Progress Notes (Signed)
CARDIAC CLEARANCE DR Einar Gip 06-13-16 ON CHART LOV DR Einar Gip 10-13-15 ON CHART  EKG 02-13-16 EPIC  CHEST XRAY 02-13-16 EPIC

## 2016-06-20 ENCOUNTER — Encounter (HOSPITAL_COMMUNITY): Payer: Self-pay

## 2016-06-20 ENCOUNTER — Encounter (HOSPITAL_COMMUNITY)
Admission: RE | Admit: 2016-06-20 | Discharge: 2016-06-20 | Disposition: A | Discharge status: Home / Self Care | Payer: 59 | Source: Ambulatory Visit | Attending: General Surgery | Admitting: General Surgery

## 2016-06-20 DIAGNOSIS — Z01812 Encounter for preprocedural laboratory examination: Secondary | ICD-10-CM | POA: Insufficient documentation

## 2016-06-20 DIAGNOSIS — Z6841 Body Mass Index (BMI) 40.0 and over, adult: Secondary | ICD-10-CM | POA: Diagnosis not present

## 2016-06-20 DIAGNOSIS — E669 Obesity, unspecified: Secondary | ICD-10-CM | POA: Insufficient documentation

## 2016-06-20 LAB — COMPREHENSIVE METABOLIC PANEL
ALBUMIN: 3.8 g/dL (ref 3.5–5.0)
ALT: 44 U/L (ref 14–54)
AST: 27 U/L (ref 15–41)
Alkaline Phosphatase: 94 U/L (ref 38–126)
Anion gap: 9 (ref 5–15)
BUN: 18 mg/dL (ref 6–20)
CALCIUM: 9.2 mg/dL (ref 8.9–10.3)
CO2: 28 mmol/L (ref 22–32)
CREATININE: 0.95 mg/dL (ref 0.44–1.00)
Chloride: 101 mmol/L (ref 101–111)
GFR calc Af Amer: >60 mL/min (ref >60)
GFR calc non Af Amer: >60 mL/min (ref >60)
GLUCOSE: 87 mg/dL (ref 65–99)
Potassium: 4 mmol/L (ref 3.5–5.1)
SODIUM: 138 mmol/L (ref 135–145)
Total Bilirubin: 0.6 mg/dL (ref 0.3–1.2)
Total Protein: 7.2 g/dL (ref 6.5–8.1)

## 2016-06-20 LAB — CBC WITH DIFFERENTIAL/PLATELET
Basophils Absolute: 0 10*3/uL (ref 0.0–0.1)
Basophils Relative: 0 %
EOS ABS: 0.1 10*3/uL (ref 0.0–0.7)
Eosinophils Relative: 1 %
HCT: 40.8 % (ref 36.0–46.0)
HEMOGLOBIN: 13.2 g/dL (ref 12.0–15.0)
LYMPHS ABS: 2.5 10*3/uL (ref 0.7–4.0)
Lymphocytes Relative: 24 %
MCH: 26.3 pg (ref 26.0–34.0)
MCHC: 32.4 g/dL (ref 30.0–36.0)
MCV: 81.4 fL (ref 78.0–100.0)
Monocytes Absolute: 0.7 10*3/uL (ref 0.1–1.0)
Monocytes Relative: 7 %
NEUTROS PCT: 68 %
Neutro Abs: 7.1 10*3/uL (ref 1.7–7.7)
Platelets: 228 10*3/uL (ref 150–400)
RBC: 5.01 MIL/uL (ref 3.87–5.11)
RDW: 15.5 % (ref 11.5–15.5)
WBC: 10.4 10*3/uL (ref 4.0–10.5)

## 2016-07-01 ENCOUNTER — Encounter (HOSPITAL_COMMUNITY)
Admission: RE | Disposition: A | Discharge status: Home / Self Care | Payer: Self-pay | Source: Ambulatory Visit | Attending: General Surgery

## 2016-07-01 ENCOUNTER — Inpatient Hospital Stay (HOSPITAL_COMMUNITY): Payer: 59 | Admitting: Certified Registered Nurse Anesthetist

## 2016-07-01 ENCOUNTER — Encounter (HOSPITAL_COMMUNITY): Payer: Self-pay | Admitting: *Deleted

## 2016-07-01 ENCOUNTER — Inpatient Hospital Stay (HOSPITAL_COMMUNITY)
Admission: RE | Admit: 2016-07-01 | Discharge: 2016-07-04 | DRG: 621 | Disposition: A | Discharge status: Home / Self Care | Payer: 59 | Source: Ambulatory Visit | Attending: General Surgery | Admitting: General Surgery

## 2016-07-01 DIAGNOSIS — Z7982 Long term (current) use of aspirin: Secondary | ICD-10-CM | POA: Diagnosis not present

## 2016-07-01 DIAGNOSIS — G47419 Narcolepsy without cataplexy: Secondary | ICD-10-CM | POA: Diagnosis present

## 2016-07-01 DIAGNOSIS — Z6841 Body Mass Index (BMI) 40.0 and over, adult: Secondary | ICD-10-CM

## 2016-07-01 DIAGNOSIS — R0600 Dyspnea, unspecified: Secondary | ICD-10-CM

## 2016-07-01 DIAGNOSIS — R911 Solitary pulmonary nodule: Secondary | ICD-10-CM | POA: Diagnosis present

## 2016-07-01 DIAGNOSIS — E538 Deficiency of other specified B group vitamins: Secondary | ICD-10-CM | POA: Diagnosis present

## 2016-07-01 DIAGNOSIS — E039 Hypothyroidism, unspecified: Secondary | ICD-10-CM | POA: Diagnosis present

## 2016-07-01 DIAGNOSIS — F329 Major depressive disorder, single episode, unspecified: Secondary | ICD-10-CM | POA: Diagnosis present

## 2016-07-01 DIAGNOSIS — Z79899 Other long term (current) drug therapy: Secondary | ICD-10-CM | POA: Diagnosis not present

## 2016-07-01 DIAGNOSIS — Z808 Family history of malignant neoplasm of other organs or systems: Secondary | ICD-10-CM | POA: Diagnosis not present

## 2016-07-01 DIAGNOSIS — I509 Heart failure, unspecified: Secondary | ICD-10-CM | POA: Diagnosis present

## 2016-07-01 DIAGNOSIS — I110 Hypertensive heart disease with heart failure: Secondary | ICD-10-CM | POA: Diagnosis present

## 2016-07-01 DIAGNOSIS — R42 Dizziness and giddiness: Secondary | ICD-10-CM | POA: Diagnosis not present

## 2016-07-01 DIAGNOSIS — Z8542 Personal history of malignant neoplasm of other parts of uterus: Secondary | ICD-10-CM

## 2016-07-01 DIAGNOSIS — R110 Nausea: Secondary | ICD-10-CM | POA: Diagnosis not present

## 2016-07-01 DIAGNOSIS — Z9049 Acquired absence of other specified parts of digestive tract: Secondary | ICD-10-CM | POA: Diagnosis not present

## 2016-07-01 DIAGNOSIS — Z90710 Acquired absence of both cervix and uterus: Secondary | ICD-10-CM

## 2016-07-01 DIAGNOSIS — G4733 Obstructive sleep apnea (adult) (pediatric): Secondary | ICD-10-CM | POA: Diagnosis present

## 2016-07-01 DIAGNOSIS — K449 Diaphragmatic hernia without obstruction or gangrene: Secondary | ICD-10-CM | POA: Diagnosis present

## 2016-07-01 DIAGNOSIS — Z888 Allergy status to other drugs, medicaments and biological substances status: Secondary | ICD-10-CM

## 2016-07-01 DIAGNOSIS — K219 Gastro-esophageal reflux disease without esophagitis: Secondary | ICD-10-CM | POA: Diagnosis present

## 2016-07-01 HISTORY — PX: LAPAROSCOPIC ROUX-EN-Y GASTRIC BYPASS WITH HIATAL HERNIA REPAIR: SHX6513

## 2016-07-01 HISTORY — PX: UPPER GI ENDOSCOPY: SHX6162

## 2016-07-01 LAB — CREATININE, SERUM
Creatinine, Ser: 1.07 mg/dL — ABNORMAL HIGH (ref 0.44–1.00)
GFR calc Af Amer: >60 mL/min (ref >60)
GFR calc non Af Amer: >60 mL/min (ref >60)

## 2016-07-01 LAB — CBC
HCT: 40.8 % (ref 36.0–46.0)
Hemoglobin: 13.2 g/dL (ref 12.0–15.0)
MCH: 26.8 pg (ref 26.0–34.0)
MCHC: 32.4 g/dL (ref 30.0–36.0)
MCV: 82.8 fL (ref 78.0–100.0)
Platelets: 184 10*3/uL (ref 150–400)
RBC: 4.93 MIL/uL (ref 3.87–5.11)
RDW: 16 % — ABNORMAL HIGH (ref 11.5–15.5)
WBC: 13.1 10*3/uL — ABNORMAL HIGH (ref 4.0–10.5)

## 2016-07-01 LAB — HEMOGLOBIN AND HEMATOCRIT, BLOOD
HCT: 41.8 % (ref 36.0–46.0)
Hemoglobin: 13.4 g/dL (ref 12.0–15.0)

## 2016-07-01 SURGERY — CREATION, GASTRIC BYPASS, LAPAROSCOPIC, USING ROUX-EN-Y GASTROENTEROSTOMY, WITH HIATAL HERNIA REPAIR
Anesthesia: General | Site: Abdomen

## 2016-07-01 MED ORDER — BUPIVACAINE HCL 0.25 % IJ SOLN
INTRAMUSCULAR | Status: DC | PRN
  Administered 2016-07-01: 30 mL

## 2016-07-01 MED ORDER — PROPOFOL 10 MG/ML IV BOLUS
INTRAVENOUS | Status: AC
  Filled 2016-07-01: qty 20

## 2016-07-01 MED ORDER — ACETAMINOPHEN 500 MG PO TABS
1000.0000 mg | ORAL_TABLET | ORAL | Status: AC
  Administered 2016-07-01: 1000 mg via ORAL
  Filled 2016-07-01 (×2): qty 2

## 2016-07-01 MED ORDER — LIDOCAINE 2% (20 MG/ML) 5 ML SYRINGE
INTRAMUSCULAR | Status: DC | PRN
  Administered 2016-07-01: 1.5 mg/kg/h via INTRAVENOUS

## 2016-07-01 MED ORDER — GABAPENTIN 300 MG PO CAPS
300.0000 mg | ORAL_CAPSULE | ORAL | Status: AC
  Administered 2016-07-01: 300 mg via ORAL
  Filled 2016-07-01: qty 1

## 2016-07-01 MED ORDER — ROCURONIUM BROMIDE 50 MG/5ML IV SOSY
PREFILLED_SYRINGE | INTRAVENOUS | Status: DC | PRN
  Administered 2016-07-01: 10 mg via INTRAVENOUS
  Administered 2016-07-01: 50 mg via INTRAVENOUS
  Administered 2016-07-01 (×2): 20 mg via INTRAVENOUS

## 2016-07-01 MED ORDER — SUGAMMADEX SODIUM 200 MG/2ML IV SOLN
INTRAVENOUS | Status: DC | PRN
  Administered 2016-07-01: 300 mg via INTRAVENOUS
  Administered 2016-07-01: 200 mg via INTRAVENOUS

## 2016-07-01 MED ORDER — METOCLOPRAMIDE HCL 5 MG/ML IJ SOLN
INTRAMUSCULAR | Status: DC | PRN
  Administered 2016-07-01: 10 mg via INTRAVENOUS

## 2016-07-01 MED ORDER — SODIUM CHLORIDE 0.9 % IV SOLN
INTRAVENOUS | Status: DC
  Administered 2016-07-01 – 2016-07-04 (×4): via INTRAVENOUS

## 2016-07-01 MED ORDER — HEPARIN SODIUM (PORCINE) 5000 UNIT/ML IJ SOLN
5000.0000 [IU] | INTRAMUSCULAR | Status: AC
  Administered 2016-07-01: 5000 [IU] via SUBCUTANEOUS
  Filled 2016-07-01: qty 1

## 2016-07-01 MED ORDER — MORPHINE SULFATE (PF) 4 MG/ML IV SOLN
1.0000 mg | INTRAVENOUS | Status: DC | PRN
  Administered 2016-07-01 (×2): 3 mg via INTRAVENOUS
  Filled 2016-07-01 (×2): qty 1

## 2016-07-01 MED ORDER — CELECOXIB 200 MG PO CAPS
400.0000 mg | ORAL_CAPSULE | ORAL | Status: AC
  Administered 2016-07-01: 400 mg via ORAL
  Filled 2016-07-01: qty 2

## 2016-07-01 MED ORDER — MIDAZOLAM HCL 5 MG/5ML IJ SOLN
INTRAMUSCULAR | Status: DC | PRN
  Administered 2016-07-01 (×2): 1 mg via INTRAVENOUS

## 2016-07-01 MED ORDER — BUPIVACAINE HCL (PF) 0.25 % IJ SOLN
INTRAMUSCULAR | Status: AC
  Filled 2016-07-01: qty 30

## 2016-07-01 MED ORDER — ONDANSETRON HCL 4 MG/2ML IJ SOLN
INTRAMUSCULAR | Status: DC | PRN
  Administered 2016-07-01: 4 mg via INTRAVENOUS

## 2016-07-01 MED ORDER — SUGAMMADEX SODIUM 500 MG/5ML IV SOLN
INTRAVENOUS | Status: AC
  Filled 2016-07-01: qty 5

## 2016-07-01 MED ORDER — HYDROMORPHONE HCL 1 MG/ML IJ SOLN
INTRAMUSCULAR | Status: AC
  Filled 2016-07-01: qty 1

## 2016-07-01 MED ORDER — LIDOCAINE 2% (20 MG/ML) 5 ML SYRINGE
INTRAMUSCULAR | Status: AC
  Filled 2016-07-01: qty 15

## 2016-07-01 MED ORDER — CHLORHEXIDINE GLUCONATE 4 % EX LIQD: 60.0000 mL | Freq: Once | CUTANEOUS | Status: DC

## 2016-07-01 MED ORDER — PROMETHAZINE HCL 25 MG/ML IJ SOLN: 6.2500 mg | INTRAMUSCULAR | Status: DC | PRN

## 2016-07-01 MED ORDER — OXYCODONE HCL 5 MG/5ML PO SOLN
5.0000 mg | ORAL | Status: DC | PRN
  Administered 2016-07-01 – 2016-07-04 (×8): 5 mg via ORAL
  Filled 2016-07-01 (×8): qty 5

## 2016-07-01 MED ORDER — KETAMINE HCL 10 MG/ML IJ SOLN
INTRAMUSCULAR | Status: AC
  Filled 2016-07-01: qty 1

## 2016-07-01 MED ORDER — FENTANYL CITRATE (PF) 100 MCG/2ML IJ SOLN
25.0000 ug | INTRAMUSCULAR | Status: DC | PRN
  Administered 2016-07-01 (×2): 50 ug via INTRAVENOUS

## 2016-07-01 MED ORDER — FENTANYL CITRATE (PF) 100 MCG/2ML IJ SOLN
INTRAMUSCULAR | Status: DC | PRN
  Administered 2016-07-01: 100 ug via INTRAVENOUS
  Administered 2016-07-01 (×4): 50 ug via INTRAVENOUS

## 2016-07-01 MED ORDER — ACETAMINOPHEN 160 MG/5ML PO SOLN
325.0000 mg | ORAL | Status: DC | PRN
  Administered 2016-07-03 (×2): 650 mg via ORAL
  Filled 2016-07-01 (×3): qty 20.3

## 2016-07-01 MED ORDER — FENTANYL CITRATE (PF) 100 MCG/2ML IJ SOLN
INTRAMUSCULAR | Status: AC
  Filled 2016-07-01: qty 2

## 2016-07-01 MED ORDER — PROPOFOL 10 MG/ML IV BOLUS
INTRAVENOUS | Status: DC | PRN
  Administered 2016-07-01: 150 mg via INTRAVENOUS

## 2016-07-01 MED ORDER — CEFOTETAN DISODIUM-DEXTROSE 2-2.08 GM-% IV SOLR
2.0000 g | INTRAVENOUS | Status: AC
  Administered 2016-07-01: 2 g via INTRAVENOUS

## 2016-07-01 MED ORDER — BUPIVACAINE LIPOSOME 1.3 % IJ SUSP
INTRAMUSCULAR | Status: DC | PRN
  Administered 2016-07-01: 20 mL

## 2016-07-01 MED ORDER — 0.9 % SODIUM CHLORIDE (POUR BTL) OPTIME
TOPICAL | Status: DC | PRN
  Administered 2016-07-01: 1000 mL

## 2016-07-01 MED ORDER — DEXAMETHASONE SODIUM PHOSPHATE 10 MG/ML IJ SOLN
INTRAMUSCULAR | Status: DC | PRN
  Administered 2016-07-01: 10 mg via INTRAVENOUS

## 2016-07-01 MED ORDER — HYDROMORPHONE HCL 1 MG/ML IJ SOLN
0.2500 mg | INTRAMUSCULAR | Status: DC | PRN
  Administered 2016-07-01 (×3): 0.5 mg via INTRAVENOUS

## 2016-07-01 MED ORDER — LACTATED RINGERS IR SOLN
Status: DC | PRN
  Administered 2016-07-01: 1000 mL

## 2016-07-01 MED ORDER — PANTOPRAZOLE SODIUM 40 MG IV SOLR
40.0000 mg | Freq: Every day | INTRAVENOUS | Status: DC
  Administered 2016-07-01 – 2016-07-03 (×3): 40 mg via INTRAVENOUS
  Filled 2016-07-01 (×3): qty 40

## 2016-07-01 MED ORDER — PREMIER PROTEIN SHAKE
2.0000 [oz_av] | ORAL | Status: DC
  Administered 2016-07-03 – 2016-07-04 (×14): 2 [oz_av] via ORAL

## 2016-07-01 MED ORDER — ENOXAPARIN SODIUM 30 MG/0.3ML ~~LOC~~ SOLN
30.0000 mg | Freq: Two times a day (BID) | SUBCUTANEOUS | Status: DC
  Administered 2016-07-02 – 2016-07-04 (×5): 30 mg via SUBCUTANEOUS
  Filled 2016-07-01 (×5): qty 0.3

## 2016-07-01 MED ORDER — APREPITANT 40 MG PO CAPS
40.0000 mg | ORAL_CAPSULE | ORAL | Status: AC
  Administered 2016-07-01: 40 mg via ORAL
  Filled 2016-07-01: qty 1

## 2016-07-01 MED ORDER — SCOPOLAMINE 1 MG/3DAYS TD PT72
MEDICATED_PATCH | TRANSDERMAL | Status: AC
  Filled 2016-07-01: qty 1

## 2016-07-01 MED ORDER — BUPIVACAINE LIPOSOME 1.3 % IJ SUSP
20.0000 mL | Freq: Once | INTRAMUSCULAR | Status: DC
  Filled 2016-07-01: qty 20

## 2016-07-01 MED ORDER — DEXAMETHASONE SODIUM PHOSPHATE 10 MG/ML IJ SOLN
INTRAMUSCULAR | Status: AC
  Filled 2016-07-01: qty 1

## 2016-07-01 MED ORDER — ONDANSETRON HCL 4 MG/2ML IJ SOLN
INTRAMUSCULAR | Status: AC
  Filled 2016-07-01: qty 2

## 2016-07-01 MED ORDER — CEFOTETAN DISODIUM-DEXTROSE 2-2.08 GM-% IV SOLR
INTRAVENOUS | Status: AC
  Filled 2016-07-01: qty 50

## 2016-07-01 MED ORDER — METOCLOPRAMIDE HCL 5 MG/ML IJ SOLN
INTRAMUSCULAR | Status: AC
  Filled 2016-07-01: qty 2

## 2016-07-01 MED ORDER — LACTATED RINGERS IV SOLN
INTRAVENOUS | Status: DC | PRN
  Administered 2016-07-01 (×2): via INTRAVENOUS

## 2016-07-01 MED ORDER — ACETAMINOPHEN 325 MG PO TABS: 650.0000 mg | ORAL_TABLET | ORAL | Status: DC | PRN

## 2016-07-01 MED ORDER — EPHEDRINE SULFATE-NACL 50-0.9 MG/10ML-% IV SOSY
PREFILLED_SYRINGE | INTRAVENOUS | Status: DC | PRN
  Administered 2016-07-01: 10 mg via INTRAVENOUS
  Administered 2016-07-01: 5 mg via INTRAVENOUS
  Administered 2016-07-01: 10 mg via INTRAVENOUS
  Administered 2016-07-01: 5 mg via INTRAVENOUS

## 2016-07-01 MED ORDER — DIPHENHYDRAMINE HCL 50 MG/ML IJ SOLN
INTRAMUSCULAR | Status: AC
  Filled 2016-07-01: qty 1

## 2016-07-01 MED ORDER — DIPHENHYDRAMINE HCL 50 MG/ML IJ SOLN
INTRAMUSCULAR | Status: DC | PRN
  Administered 2016-07-01: 12.5 mg via INTRAVENOUS

## 2016-07-01 MED ORDER — ROCURONIUM BROMIDE 50 MG/5ML IV SOSY
PREFILLED_SYRINGE | INTRAVENOUS | Status: AC
  Filled 2016-07-01: qty 10

## 2016-07-01 MED ORDER — MIDAZOLAM HCL 2 MG/2ML IJ SOLN
INTRAMUSCULAR | Status: AC
  Filled 2016-07-01: qty 2

## 2016-07-01 MED ORDER — SCOPOLAMINE 1 MG/3DAYS TD PT72
1.0000 | MEDICATED_PATCH | TRANSDERMAL | Status: DC
  Administered 2016-07-01: 1.5 mg via TRANSDERMAL
  Filled 2016-07-01: qty 1

## 2016-07-01 MED ORDER — LIDOCAINE 2% (20 MG/ML) 5 ML SYRINGE
INTRAMUSCULAR | Status: DC | PRN
  Administered 2016-07-01: 50 mg via INTRAVENOUS

## 2016-07-01 MED ORDER — EPHEDRINE 5 MG/ML INJ
INTRAVENOUS | Status: AC
  Filled 2016-07-01: qty 10

## 2016-07-01 MED ORDER — ONDANSETRON HCL 4 MG/2ML IJ SOLN
4.0000 mg | INTRAMUSCULAR | Status: DC | PRN
  Administered 2016-07-01 – 2016-07-04 (×6): 4 mg via INTRAVENOUS
  Filled 2016-07-01 (×6): qty 2

## 2016-07-01 MED ORDER — LIDOCAINE 2% (20 MG/ML) 5 ML SYRINGE
INTRAMUSCULAR | Status: AC
  Filled 2016-07-01: qty 5

## 2016-07-01 MED ORDER — KETAMINE HCL 10 MG/ML IJ SOLN
INTRAMUSCULAR | Status: DC | PRN
  Administered 2016-07-01: 30 mg via INTRAVENOUS

## 2016-07-01 SURGICAL SUPPLY — 68 items
APPLIER CLIP 5 13 M/L LIGAMAX5 (MISCELLANEOUS)
APPLIER CLIP ROT 10 11.4 M/L (STAPLE)
BANDAGE ADH SHEER 1  50/CT (GAUZE/BANDAGES/DRESSINGS) IMPLANT
BENZOIN TINCTURE PRP APPL 2/3 (GAUZE/BANDAGES/DRESSINGS) ×4 IMPLANT
BLADE SURG SZ11 CARB STEEL (BLADE) ×4 IMPLANT
CABLE HIGH FREQUENCY MONO STRZ (ELECTRODE) ×4 IMPLANT
CHLORAPREP W/TINT 26ML (MISCELLANEOUS) ×8 IMPLANT
CLIP APPLIE 5 13 M/L LIGAMAX5 (MISCELLANEOUS) IMPLANT
CLIP APPLIE ROT 10 11.4 M/L (STAPLE) IMPLANT
CLOSURE WOUND 1/2 X4 (GAUZE/BANDAGES/DRESSINGS) ×1
COVER SURGICAL LIGHT HANDLE (MISCELLANEOUS) IMPLANT
DECANTER SPIKE VIAL GLASS SM (MISCELLANEOUS) IMPLANT
DEVICE PMI PUNCTURE CLOSURE (MISCELLANEOUS) ×4 IMPLANT
DEVICE SUTURE ENDOST 10MM (ENDOMECHANICALS) ×4 IMPLANT
DRAIN CHANNEL 19F RND (DRAIN) IMPLANT
DRAIN PENROSE 18X1/4 LTX STRL (WOUND CARE) ×4 IMPLANT
ELECT L-HOOK LAP 45CM DISP (ELECTROSURGICAL) ×4
ELECT PENCIL ROCKER SW 15FT (MISCELLANEOUS) ×4 IMPLANT
ELECT REM PT RETURN 9FT ADLT (ELECTROSURGICAL) ×4
ELECTRODE L-HOOK LAP 45CM DISP (ELECTROSURGICAL) ×2 IMPLANT
ELECTRODE REM PT RTRN 9FT ADLT (ELECTROSURGICAL) ×2 IMPLANT
EVACUATOR SILICONE 100CC (DRAIN) IMPLANT
GAUZE SPONGE 4X4 16PLY XRAY LF (GAUZE/BANDAGES/DRESSINGS) IMPLANT
GLOVE BIOGEL PI IND STRL 7.0 (GLOVE) ×2 IMPLANT
GLOVE BIOGEL PI INDICATOR 7.0 (GLOVE) ×2
GLOVE SURG SS PI 7.0 STRL IVOR (GLOVE) ×4 IMPLANT
GOWN STRL REUS W/ TWL LRG LVL3 (GOWN DISPOSABLE) ×2 IMPLANT
GOWN STRL REUS W/TWL LRG LVL3 (GOWN DISPOSABLE) ×2
GOWN STRL REUS W/TWL XL LVL3 (GOWN DISPOSABLE) ×12 IMPLANT
HANDLE STAPLE EGIA 4 XL (STAPLE) ×4 IMPLANT
HOVERMATT SINGLE USE (MISCELLANEOUS) ×4 IMPLANT
IRRIG SUCT STRYKERFLOW 2 WTIP (MISCELLANEOUS)
IRRIGATION SUCT STRKRFLW 2 WTP (MISCELLANEOUS) IMPLANT
KIT BASIN OR (CUSTOM PROCEDURE TRAY) ×4 IMPLANT
KIT GASTRIC LAVAGE 34FR ADT (SET/KITS/TRAYS/PACK) ×4 IMPLANT
MARKER SKIN DUAL TIP RULER LAB (MISCELLANEOUS) ×4 IMPLANT
NEEDLE SPNL 22GX3.5 QUINCKE BK (NEEDLE) ×4 IMPLANT
PACK CARDIOVASCULAR III (CUSTOM PROCEDURE TRAY) ×4 IMPLANT
RELOAD EGIA 45 MED/THCK PURPLE (STAPLE) IMPLANT
RELOAD EGIA 45 TAN VASC (STAPLE) IMPLANT
RELOAD EGIA 60 MED/THCK PURPLE (STAPLE) ×16 IMPLANT
RELOAD EGIA 60 TAN VASC (STAPLE) ×12 IMPLANT
RELOAD ENDO STITCH 2.0 (ENDOMECHANICALS) ×24
SCISSORS LAP 5X45 EPIX DISP (ENDOMECHANICALS) ×4 IMPLANT
SET IRRIG TUBING LAPAROSCOPIC (IRRIGATION / IRRIGATOR) ×4 IMPLANT
SHEARS HARMONIC ACE PLUS 45CM (MISCELLANEOUS) ×4 IMPLANT
SLEEVE XCEL OPT CAN 5 100 (ENDOMECHANICALS) ×12 IMPLANT
SOLUTION ANTI FOG 6CC (MISCELLANEOUS) ×4 IMPLANT
SPONGE LAP 18X18 X RAY DECT (DISPOSABLE) ×4 IMPLANT
STRIP CLOSURE SKIN 1/2X4 (GAUZE/BANDAGES/DRESSINGS) ×3 IMPLANT
SUT ETHIBOND 0 36 GRN (SUTURE) ×12 IMPLANT
SUT ETHILON 2 0 PS N (SUTURE) IMPLANT
SUT MNCRL AB 4-0 PS2 18 (SUTURE) ×4 IMPLANT
SUT RELOAD ENDO STITCH 2 48X1 (ENDOMECHANICALS) ×12
SUT RELOAD ENDO STITCH 2.0 (ENDOMECHANICALS) ×12
SUT VICRYL 0 TIES 12 18 (SUTURE) ×4 IMPLANT
SUTURE RELOAD END STTCH 2 48X1 (ENDOMECHANICALS) ×12 IMPLANT
SUTURE RELOAD ENDO STITCH 2.0 (ENDOMECHANICALS) ×12 IMPLANT
SYR 20CC LL (SYRINGE) ×4 IMPLANT
TOWEL OR 17X26 10 PK STRL BLUE (TOWEL DISPOSABLE) ×4 IMPLANT
TOWEL OR NON WOVEN STRL DISP B (DISPOSABLE) IMPLANT
TRAY FOLEY CATH 14FRSI W/METER (CATHETERS) ×4 IMPLANT
TRAY FOLEY CATH 16FRSI W/METER (SET/KITS/TRAYS/PACK) IMPLANT
TROCAR BLADELESS OPT 5 100 (ENDOMECHANICALS) ×4 IMPLANT
TROCAR XCEL 12X100 BLDLESS (ENDOMECHANICALS) IMPLANT
TUBE CALIBRATION LAPBAND (TUBING) ×4 IMPLANT
TUBING ENDO SMARTCAP PENTAX (MISCELLANEOUS) ×4 IMPLANT
TUBING INSUF HEATED (TUBING) ×4 IMPLANT

## 2016-07-01 NOTE — H&P (Signed)
Jennifer Mayo is an 41 y.o. female.   Chief Complaint: obesity HPI: 41 yo female with morbid obesity, narcolepsy, CHF presents for gastric bypass today.  Past Medical History:  Diagnosis Date  . Anemia   . Cancer [facility])    endometrial  . 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 CHF (congestive heart failure) ([place])   . Depression   . History of endometrial cancer    had hysterectomy in August 2015  . Mild diastolic dysfunction   . Narcolepsy   . Obstructive sleep apnea   . Pleural effusion, bilateral    3-4 years   . Solitary pulmonary nodule on lung CT    follow up CT recommended per medical records  . Thyroid disease   . Vitamin B 12 deficiency     Past Surgical History:  Procedure Laterality Date  . ABDOMINAL HYSTERECTOMY    . CHOLECYSTECTOMY      Family History  Problem Relation Age of Onset  . Cancer Mother   . Brain cancer Mother   . Seizures Mother   . Stroke Mother   . Diabetes Father    Social History:  reports that she has never smoked. She has never used smokeless tobacco. She reports that she drinks alcohol. She reports that she does not use drugs.  Allergies:  Allergies  Allergen Reactions  . Ranexa [Ranolazine] Shortness Of Breath  . Verapamil Other (See Comments)    hypotension    Medications Prior to Admission  Medication Sig Dispense Refill  . aspirin 81 MG tablet Take 81 mg by mouth daily.    Marland Kitchen levothyroxine (SYNTHROID, LEVOTHROID) 25 MCG tablet Take 25 mcg by mouth daily before breakfast.    . modafinil (PROVIGIL) 200 MG tablet Take 20 mg by mouth daily.    Marland Kitchen spironolactone (ALDACTONE) 50 MG tablet Take 50 mg by mouth daily.    . TURMERIC PO Take 1 tablet by mouth daily.     . Vilazodone HCl (VIIBRYD) 20 MG TABS Take 20 mg by mouth daily.    . vitamin B-12 (CYANOCOBALAMIN) 1000 MCG tablet Take 1,000 mcg by mouth daily.    . Vitamin D, Ergocalciferol, (DRISDOL) 50000 units CAPS capsule Take 50,000 Units by mouth See admin instructions. Takes on  Mondays and Thursdays    . ondansetron (ZOFRAN-ODT) 4 MG disintegrating tablet Take 4 mg by mouth every 6 (six) hours as needed.  0  . pantoprazole (PROTONIX) 40 MG tablet Take 40 mg by mouth daily.  3    No results found for this or any previous visit (from the past 48 hour(s)). No results found.  Review of Systems  Constitutional: Negative for chills and fever.  HENT: Negative for hearing loss.   Eyes: Negative for blurred vision and double vision.  Respiratory: Negative for cough and hemoptysis.   Cardiovascular: Negative for chest pain and palpitations.  Gastrointestinal: Negative for abdominal pain, nausea and vomiting.  Genitourinary: Negative for dysuria and urgency.  Musculoskeletal: Negative for myalgias and neck pain.  Skin: Negative for itching and rash.  Neurological: Negative for dizziness, tingling and headaches.  Endo/Heme/Allergies: Does not bruise/bleed easily.  Psychiatric/Behavioral: Negative for depression and suicidal ideas.    Blood pressure (!) 163/98, pulse 81, temperature 98.6 F (37 C), resp. rate 16, height 5\' 6"  (1.676 m), weight (!) 138.9 kg (306 lb 4 oz), SpO2 98 %. Physical Exam  Vitals reviewed. Constitutional: She is oriented to person, place, and time. She appears well-developed and well-nourished.  HENT:  Head: Normocephalic and atraumatic.  Eyes: Conjunctivae and EOM are normal. Pupils are equal, round, and reactive to light.  Neck: Normal range of motion. Neck supple.  Cardiovascular: Normal rate and regular rhythm.   Respiratory: Effort normal and breath sounds normal.  GI: Soft. Bowel sounds are normal. She exhibits no distension. There is no tenderness.  Musculoskeletal: Normal range of motion.  Neurological: She is alert and oriented to person, place, and time.  Skin: Skin is warm and dry.  Psychiatric: She has a normal mood and affect. Her behavior is normal.     Assessment/Plan 41 yo female with morbid obesity -lap RNY gastric  bypass -post op bariatric protocol  Mickeal Skinner, MD 07/01/2016, 7:05 AM

## 2016-07-01 NOTE — Transfer of Care (Signed)
Immediate Anesthesia Transfer of Care Note  Patient: Jennifer Mayo  Procedure(s) Performed: Procedure(s): LAPAROSCOPIC ROUX-EN-Y GASTRIC BYPASS WITH HIATAL HERNIA REPAIR, UPPER ENDO (N/A) UPPER GI ENDOSCOPY  Patient Location: PACU  Anesthesia Type:General  Level of Consciousness: Patient easily awoken, sedated, comfortable, cooperative, following commands, responds to stimulation.   Airway & Oxygen Therapy: Patient spontaneously breathing, ventilating well, oxygen via simple oxygen mask.  Post-op Assessment: Report given to PACU RN, vital signs reviewed and stable, moving all extremities.   Post vital signs: Reviewed and stable.  Complications: No apparent anesthesia complications  Last Vitals:  Vitals:   07/01/16 0547  BP: (!) 163/98  Pulse: 81  Resp: 16  Temp: 37 C    Last Pain:  Vitals:   07/01/16 0600  PainSc: 0-No pain      Patients Stated Pain Goal: 3 (85/90/93 1121)  Complications: No apparent anesthesia complications

## 2016-07-01 NOTE — Anesthesia Procedure Notes (Signed)
Procedure Name: Intubation Date/Time: 07/01/2016 7:35 AM Performed by: Deliah Boston Pre-anesthesia Checklist: Patient identified, Emergency Drugs available, Suction available and Patient being monitored Patient Re-evaluated:Patient Re-evaluated prior to inductionOxygen Delivery Method: Circle system utilized Preoxygenation: Pre-oxygenation with 100% oxygen Intubation Type: IV induction Ventilation: Mask ventilation without difficulty Laryngoscope Size: Mac and 3 Grade View: Grade I Tube type: Oral Tube size: 7.0 mm Number of attempts: 1 Airway Equipment and Method: Stylet and Oral airway Placement Confirmation: ETT inserted through vocal cords under direct vision,  positive ETCO2 and breath sounds checked- equal and bilateral Secured at: 22 cm Tube secured with: Tape Dental Injury: Teeth and Oropharynx as per pre-operative assessment

## 2016-07-01 NOTE — Progress Notes (Signed)
Pt unable to produce urine. In and out cath performed with 600cc yellow urine removed from bladder. Reported to oncoming shift to continue to monitor.

## 2016-07-01 NOTE — Op Note (Signed)
Preop Diagnosis: Obesity Class III  Postop Diagnosis: same  Procedure performed: laparoscopic Roux en Y gastric bypass  Assitant: Greer Pickerel  Indications:  The patient is a 41 y.o. year-old morbidly obese female who has been followed in the Bariatric Clinic as an outpatient. This patient was diagnosed with morbid obesity with a BMI of Body mass index is 49.43 kg/m. and significant co-morbidities including hypertension, chronic back problems and coronary heart disease.  The patient was counseled extensively in the Bariatric Outpatient Clinic and after a thorough explanation of the risks and benefits of surgery (including death from complications, bowel leak, infection such as peritonitis and/or sepsis, internal hernia, bleeding, need for blood transfusion, bowel obstruction, organ failure, pulmonary embolus, deep venous thrombosis, wound infection, incisional hernia, skin breakdown, and others entailed on the consent form) and after a compliant diet and exercise program, the patient was scheduled for an elective laparoscopic sleeve gastrectomy.  Description of Operation:  Following informed consent, the patient was taken to the operating room and placed on the operating table in the supine position.  She had previously received prophylactic antibiotics and subcutaneous heparin for DVT prophylaxis in the pre-op holding area.  After induction of general endotracheal anesthesia by the anesthesiologist, the patient underwent placement of sequential compression devices, Foley catheter and an oro-gastric tube.  A timeout was confirmed by the surgery and anesthesia teams.  The patient was adequately padded at all pressure points and placed on a footboard to prevent slippage from the OR table during extremes of position during surgery.  She underwent a routine sterile prep and drape of her entire abdomen.    Next, A transverse incision was made under the left subcostal area and a 27mm optical viewing trocar  was introduced into the peritoneal cavity. Pneumoperitoneum was applied with a high flow and low pressure. A laparoscope was inserted to confirm placement. A extraperitoneal block was then placed at the lateral abdominal wall using exparel diluted with marcaine . 5 additional trocars were placed: 1 35mm trocar to the left of the midline. 1 additional 4mm trocar in the left lateral area, 1 36mm trocar in the right mid abdomen, and 1 13mm trocar in the right subcostal area.   The greater omentum was flipped over the transverse colon and under the left lobe of the liver. The ligament of trietz was identified. 30cm of jejunum was measured starting from the ligament of Trietz. The mesentery was checked to ensure mobility. Next, a 26mm 2-71mm tristapler was used to divide the jejunum at this location. The harmonic scalpel was used to divide the mesentery down to the origin. A 1/2" penrose was sutured to the distal side. 100cm of jejunum was measured starting at the division. 2-0 silk was used to appose the biliary limb to the 100cm mark of jejunum in 2 places. Enterotomies were made in the biliary and common channels and a 13mm 2-3 tristapler was used to create the J-J anastomosis. A 2-0 silk was used to appose the enterotomy edges and a 634mm 2-3 tristapler was used to close the enterotomy. An anti-obstruction 2-0 silk suture was placed. Next, the mesenteric defect was closed with a 2-0 silk in running fashion.The J-J appeared patent and in neutral position.  Next, the omentum was divided using the Harmonic scalpel. The patient was placed in steep Reverse Trendelenberg position. A Nathanson retracted was placed through a subxiphoid incision and used to retract the liver.   The UGI showed a small hiatal hernia. Therefore, the pars flaccida  was incised with harmonic scalpel. The stomach was reduced but on dissection of the posterior crus there was a visible hernia with small sac. The sac was dissected free and 2 0  ethibond sutures placed in interrupted fashion. A calibration tube was passed to ensure appropriate size of the hiatus.   The fat pad over the fundus was incised to free the fundus. Next, a position along the lesser curve 6cm from GE junction was identified. The pars flaccida was entered and the fat over the lesser curve divided to enter the lesser sac. Multiple 5mm 3-29mm tristaple firings were peformed to create a 6cm pouch. The Roux limb was identified using the placed penrose and brought up to the stomach in antecolic fashion. The limb was inspected to ensure a neutral position. A 2-0 vicryl suture was then used to create a posterior layer connecting the stomach to the Roux limb jejunum in running fashion. Next cautery was used to create an enterotomy along the medial aspect of this suture line and Harmonic scalpel used to create gastotomy. A 40mm 3-27mm tristapler was then used to create a 25-71mm anastomosis. 2 2-0 vicryl sutures were used in running fashion to close the gastrotomy. Finally, a 2-0 vicryl suture was used to close an anterior layer of stomach and jejunum over the anastomosis in running fashion. The penrose was removed from the Roux limb.  The assistant then went and performed an upper endoscopy and leak test. No bubbles were seen and the pouch and limb distended appropriately. The limb and pouch were deflated, the endoscope was removed. Hemostasis was ensured. Pneumoperitoneum was evacuated, all ports were removed and all incisions closed with 4-0 monocryl suture in subcuticular fashion. Glue was put in place for dressing. The patient awoke from anesthesia and was brought to pacu in stable condition. All counts were correct with the exception of one extra staple load not being documented as opened earlier in the case.Marland Kitchen  Specimens:  None  Local Anesthesia: 50 ml Exparel: 0.5% Marcaine Mix  Post-Op Plan:       Pain Management: PO, prn      Antibiotics: Prophylactic       Anticoagulation: Prophylactic, Starting now      Post Op Studies/Consults: Not applicable      Intended Discharge: within 48h      Intended Outpatient Follow-Up: Two Week      Intended Outpatient Studies: Not Applicable      Other: Not Applicable   Jennifer Mayo

## 2016-07-01 NOTE — Anesthesia Preprocedure Evaluation (Addendum)
Anesthesia Evaluation  Patient identified by MRN, date of birth, ID band Patient awake    Reviewed: Allergy & Precautions, NPO status , Patient's Chart, lab work & pertinent test results  Airway Mallampati: III  TM Distance: >3 FB Neck ROM: Full    Dental  (+) Teeth Intact, Dental Advisory Given   Pulmonary sleep apnea and Continuous Positive Airway Pressure Ventilation ,    Pulmonary exam normal breath sounds clear to auscultation       Cardiovascular + angina +CHF  Normal cardiovascular exam Rhythm:Regular Rate:Normal     Neuro/Psych PSYCHIATRIC DISORDERS Depression negative neurological ROS     GI/Hepatic Neg liver ROS, GERD  Medicated,  Endo/Other  Hypothyroidism Morbid obesity  Renal/GU negative Renal ROS     Musculoskeletal negative musculoskeletal ROS (+)   Abdominal   Peds  Hematology negative hematology ROS (+)   Anesthesia Other Findings Day of surgery medications reviewed with the patient.  Reproductive/Obstetrics                            Anesthesia Physical Anesthesia Plan  ASA: III  Anesthesia Plan: General   Post-op Pain Management:    Induction: Intravenous  Airway Management Planned: Oral ETT  Additional Equipment:   Intra-op Plan:   Post-operative Plan: Extubation in OR  Informed Consent: I have reviewed the patients History and Physical, chart, labs and discussed the procedure including the risks, benefits and alternatives for the proposed anesthesia with the patient or authorized representative who has indicated his/her understanding and acceptance.   Dental advisory given  Plan Discussed with: CRNA  Anesthesia Plan Comments: (Risks/benefits of general anesthesia discussed with patient including risk of damage to teeth, lips, gum, and tongue, nausea/vomiting, allergic reactions to medications, and the possibility of heart attack, stroke and death.  All  patient questions answered.  Patient wishes to proceed.)        Anesthesia Quick Evaluation

## 2016-07-01 NOTE — Anesthesia Postprocedure Evaluation (Signed)
Anesthesia Post Note  Patient: Jennifer Mayo  Procedure(s) Performed: Procedure(s) (LRB): LAPAROSCOPIC ROUX-EN-Y GASTRIC BYPASS WITH HIATAL HERNIA REPAIR, UPPER ENDO (N/A) UPPER GI ENDOSCOPY  Patient location during evaluation: PACU Anesthesia Type: General Level of consciousness: awake and alert Pain management: pain level controlled Vital Signs Assessment: post-procedure vital signs reviewed and stable Respiratory status: spontaneous breathing, nonlabored ventilation, respiratory function stable and patient connected to nasal cannula oxygen Cardiovascular status: blood pressure returned to baseline and stable Postop Assessment: no signs of nausea or vomiting Anesthetic complications: no       Last Vitals:  Vitals:   07/01/16 1320 07/01/16 1423  BP:  107/78  Pulse: (!) 103 97  Resp: 16 16  Temp:  36.8 C    Last Pain:  Vitals:   07/01/16 1423  TempSrc: Oral  PainSc:                  Catalina Gravel

## 2016-07-01 NOTE — Op Note (Signed)
Jennifer Mayo [id number] 15-Jul-1975 07/01/2016  Preoperative diagnosis: morbid obesity  Postoperative diagnosis: Same   Procedure: Upper endoscopy   Surgeon: Gayland Curry M.D., FACS   Anesthesia: Gen.   Indications for procedure: 41 y.o. yo female undergoing a laparoscopic roux en y gastric bypass and an upper endoscopy was requested to evaluate the anastomosis.  Description of procedure: After we have completed the new gastrojejunostomy, I scrubbed out and obtained the Olympus endoscope. I gently placed endoscope in the patient's oropharynx and gently glided it down the esophagus without any difficulty under direct visualization. Once I was in the gastric pouch, I insufflated the pouch was air. The pouch was approximately 5cm in size. I was able to cannulate and advanced the scope through the gastrojejunostomy. Dr. Kieth Brightly had placed saline in the upper abdomen. Upon further insufflation of the gastric pouch there was no evidence of bubbles. Upon further inspection of the gastric pouch, the mucosa appeared normal. There is no evidence of any mucosal abnormality. The gastric pouch and Roux limb were decompressed. The width of the gastrojejunal anastomosis was at least 2 cm. The scope was withdrawn. The patient tolerated this portion of the procedure well. Please see Dr Amie Portland operative note for details regarding the laparoscopic roux-en-y gastric bypass.  Leighton Ruff. Redmond Pulling, MD, FACS General, Bariatric, & Minimally Invasive Surgery [facility], [place]

## 2016-07-02 ENCOUNTER — Encounter (HOSPITAL_COMMUNITY): Payer: Self-pay | Admitting: General Surgery

## 2016-07-02 LAB — COMPREHENSIVE METABOLIC PANEL
ALT: 136 U/L — ABNORMAL HIGH (ref 14–54)
ANION GAP: 5 (ref 5–15)
AST: 111 U/L — ABNORMAL HIGH (ref 15–41)
Albumin: 3.3 g/dL — ABNORMAL LOW (ref 3.5–5.0)
Alkaline Phosphatase: 66 U/L (ref 38–126)
BILIRUBIN TOTAL: 0.6 mg/dL (ref 0.3–1.2)
BUN: 10 mg/dL (ref 6–20)
CHLORIDE: 105 mmol/L (ref 101–111)
CO2: 27 mmol/L (ref 22–32)
Calcium: 8.8 mg/dL — ABNORMAL LOW (ref 8.9–10.3)
Creatinine, Ser: 0.68 mg/dL (ref 0.44–1.00)
Glucose, Bld: 105 mg/dL — ABNORMAL HIGH (ref 65–99)
POTASSIUM: 3.9 mmol/L (ref 3.5–5.1)
Sodium: 137 mmol/L (ref 135–145)
TOTAL PROTEIN: 6.3 g/dL — AB (ref 6.5–8.1)

## 2016-07-02 LAB — CBC WITH DIFFERENTIAL/PLATELET
Basophils Absolute: 0 10*3/uL (ref 0.0–0.1)
Basophils Relative: 0 %
Eosinophils Absolute: 0 10*3/uL (ref 0.0–0.7)
Eosinophils Relative: 0 %
HEMATOCRIT: 38.7 % (ref 36.0–46.0)
Hemoglobin: 12.7 g/dL (ref 12.0–15.0)
LYMPHS ABS: 1.7 10*3/uL (ref 0.7–4.0)
LYMPHS PCT: 13 %
MCH: 26.9 pg (ref 26.0–34.0)
MCHC: 32.8 g/dL (ref 30.0–36.0)
MCV: 82 fL (ref 78.0–100.0)
MONO ABS: 0.9 10*3/uL (ref 0.1–1.0)
MONOS PCT: 7 %
Neutro Abs: 10 10*3/uL — ABNORMAL HIGH (ref 1.7–7.7)
Neutrophils Relative %: 80 %
PLATELETS: 202 10*3/uL (ref 150–400)
RBC: 4.72 MIL/uL (ref 3.87–5.11)
RDW: 15.8 % — AB (ref 11.5–15.5)
WBC: 12.6 10*3/uL — ABNORMAL HIGH (ref 4.0–10.5)

## 2016-07-02 NOTE — Progress Notes (Signed)
Patient alert and oriented, Post op day 1.  Provided support and encouragement.  Encouraged pulmonary toilet, ambulation and small sips of liquids.  All questions answered.  Will continue to monitor. 

## 2016-07-02 NOTE — Progress Notes (Signed)
  Progress Note: Metabolic and Bariatric Surgery Service   Subjective: Pain controlled, some soreness with swallowing  Objective: Vital signs in last 24 hours: Temp:  [97.4 F (36.3 C)-99.3 F (37.4 C)] 98.2 F (36.8 C) (03/13 0629) Pulse Rate:  [82-103] 82 (03/13 0629) Resp:  [15-16] 16 (03/13 0629) BP: (97-150)/(60-84) 97/70 (03/13 0629) SpO2:  [91 %-100 %] 95 % (03/13 0629) Weight:  [140.5 kg (309 lb 11.2 oz)] 140.5 kg (309 lb 11.2 oz) (03/13 0629) Last BM Date: 06/30/16  Intake/Output from previous day: 03/12 0701 - 03/13 0700 In: 2000 [I.V.:2000] Out: 2170 [Urine:2150; Blood:20] Intake/Output this shift: No intake/output data recorded.  Lungs: CTAB  Cardiovascular: RRR  Abd: soft, ATTP, ND  Extremities: no edema  Neuro: AOx4  Lab Results: CBC   Recent Labs  07/01/16 1254 07/02/16 0522  WBC 13.1* 12.6*  HGB 13.2 12.7  HCT 40.8 38.7  PLT 184 202   BMET  Recent Labs  07/01/16 1254 07/02/16 0522  NA  --  137  K  --  3.9  CL  --  105  CO2  --  27  GLUCOSE  --  105*  BUN  --  10  CREATININE 1.07* 0.68  CALCIUM  --  8.8*   PT/INR No results for input(s): LABPROT, INR in the last 72 hours. ABG No results for input(s): PHART, HCO3 in the last 72 hours.  Invalid input(s): PCO2, PO2  Studies/Results:  Anti-infectives: Anti-infectives    Start     Dose/Rate Route Frequency Ordered Stop   07/01/16 0545  cefoTEtan in Dextrose 5% (CEFOTAN) IVPB 2 g     2 g Intravenous On call to O.R. 07/01/16 0545 07/01/16 0743      Medications: Scheduled Meds: . enoxaparin (LOVENOX) injection  30 mg Subcutaneous Q12H  . pantoprazole (PROTONIX) IV  40 mg Intravenous QHS  . [START ON 07/03/2016] protein supplement shake  2 oz Oral Q2H   Continuous Infusions: . sodium chloride 100 mL/hr at 07/01/16 2123   PRN Meds:.oxyCODONE **AND** acetaminophen, acetaminophen, morphine injection, ondansetron (ZOFRAN) IV  Assessment/Plan: Patient Active Problem List   Diagnosis Date Noted  . Morbid obesity due to excess calories ([place]) 07/01/2016  . Solitary pulmonary nodule on lung CT   . Narcolepsy   . Vitamin B 12 deficiency   . Mild diastolic dysfunction   . Obstructive sleep apnea    s/p Procedure(s): LAPAROSCOPIC ROUX-EN-Y GASTRIC BYPASS WITH HIATAL HERNIA REPAIR, UPPER ENDO UPPER GI ENDOSCOPY 07/01/2016 -POD 1 diet -likely will stay until tomorrow  Disposition:  LOS: 1 day  The patient will be in the hospital for normal postop protocol  Mickeal Skinner, MD [phone] [address] [facility]

## 2016-07-02 NOTE — Procedures (Signed)
BIPAP bedside and ready for use.  Patient places herself on when ready for bed.

## 2016-07-03 LAB — CBC WITH DIFFERENTIAL/PLATELET
Basophils Absolute: 0 10*3/uL (ref 0.0–0.1)
Basophils Relative: 0 %
Eosinophils Absolute: 0.1 10*3/uL (ref 0.0–0.7)
Eosinophils Relative: 1 %
HCT: 38.7 % (ref 36.0–46.0)
Hemoglobin: 12.2 g/dL (ref 12.0–15.0)
Lymphocytes Relative: 21 %
Lymphs Abs: 2.2 10*3/uL (ref 0.7–4.0)
MCH: 26.5 pg (ref 26.0–34.0)
MCHC: 31.5 g/dL (ref 30.0–36.0)
MCV: 83.9 fL (ref 78.0–100.0)
Monocytes Absolute: 0.7 10*3/uL (ref 0.1–1.0)
Monocytes Relative: 7 %
Neutro Abs: 7.2 10*3/uL (ref 1.7–7.7)
Neutrophils Relative %: 71 %
Platelets: 197 10*3/uL (ref 150–400)
RBC: 4.61 MIL/uL (ref 3.87–5.11)
RDW: 15.9 % — ABNORMAL HIGH (ref 11.5–15.5)
WBC: 10.1 10*3/uL (ref 4.0–10.5)

## 2016-07-03 MED ORDER — LIP MEDEX EX OINT
TOPICAL_OINTMENT | CUTANEOUS | Status: AC
  Administered 2016-07-03: 10:00:00
  Filled 2016-07-03: qty 7

## 2016-07-03 MED ORDER — SODIUM CHLORIDE 0.9 % IV BOLUS (SEPSIS)
1000.0000 mL | Freq: Once | INTRAVENOUS | Status: AC
  Administered 2016-07-03: 1000 mL via INTRAVENOUS

## 2016-07-03 NOTE — Progress Notes (Signed)
Patient alert and oriented, Post op day 2.  Provided support and encouragement.  Encouraged pulmonary toilet, ambulation and small sips of liquids.  All questions answered.  Will continue to monitor. 

## 2016-07-03 NOTE — Progress Notes (Addendum)
MD made aware via text of patient status post fluid bolus with improved blood pressure. Still c/o dizziness with ambulation but no shortness of breath.  Oxygen 1 liter nasal cannula  in place 92% per continuous pulse oximeter.  Incentive spirometer at bedside.  Resting in bed at this time tolerating both clear and full liquids.

## 2016-07-03 NOTE — Procedures (Signed)
BIPAP bedside and ready for use.  Patient said she will place on herself when ready for bed and does not need assistance.

## 2016-07-03 NOTE — Progress Notes (Signed)
Pt had O2 sats checked while ambulating. Patient O2 levels came down to 85 and became dizzy. Patient wheeled back to room in chair. Pat's O2 levels came back 90 once back in bed on RA. MD paged. Will continue to monitor.

## 2016-07-03 NOTE — Progress Notes (Signed)
MD made aware of patient dizziness with ambulation and decreased oxygen levels when ambulating.  Orthostatic BP provided. Orders received.

## 2016-07-03 NOTE — Discharge Instructions (Signed)

## 2016-07-03 NOTE — Discharge Summary (Addendum)
Physician Discharge Summary  Jennifer Mayo QIO:[id number] DOB: 04/27/1975 DOA: 07/01/2016  PCP: Bonnita Nasuti, MD  Admit date: 07/01/2016 Discharge date: 07/03/2016  Recommendations for Outpatient Follow-up:  1.  (include homehealth, outpatient follow-up instructions, specific recommendations for PCP to follow-up on, etc.)   Discharge Diagnoses:  Active Problems:   Morbid obesity due to excess calories [facility])   Surgical Procedure: Laparoscopic Sleeve Gastrectomy, upper endoscopy  Discharge Condition: Good Disposition: Home  Diet recommendation: Postoperative sleeve gastrectomy diet (liquids only)  Filed Weights   07/01/16 0547 07/02/16 0629 07/03/16 0147  Weight: (!) 138.9 kg (306 lb 4 oz) (!) 140.5 kg (309 lb 11.2 oz) (!) 141.2 kg (311 lb 4.6 oz)     Hospital Course:  The patient was admitted after undergoing Roux-en-Y gastric bypass. POD 0 she ambulated well. POD 1 she was started on the water diet protocol and tolerated 400 ml in the first shift. Once meeting the water amount she was advanced to bariatric protein shakes which they tolerated and were discharged home POD 3. She had a delay in discharge due to desaturation on room air requiring additional monitoring. Due to its persistence, CT scan was obtained POD 3 showing no PE or pneumonia but did show atelectasis of lower lobes and small effusion.  Treatments: surgery: Roux-en-Y gastric bypass  Discharge Instructions  Discharge Instructions    Ambulate hourly while awake    Complete by:  As directed    Call MD for:  difficulty breathing, headache or visual disturbances    Complete by:  As directed    Call MD for:  persistant dizziness or light-headedness    Complete by:  As directed    Call MD for:  persistant nausea and vomiting    Complete by:  As directed    Call MD for:  redness, tenderness, or signs of infection (pain, swelling, redness, odor or green/yellow discharge around incision site)    Complete by:  As  directed    Call MD for:  severe uncontrolled pain    Complete by:  As directed    Call MD for:  temperature >101 F    Complete by:  As directed    Diet bariatric full liquid    Complete by:  As directed    Discharge wound care:    Complete by:  As directed    Remove Bandaids tomorrow, ok to shower tomorrow. Steristrips may fall off in 1-3 weeks.   Incentive spirometry    Complete by:  As directed    Perform hourly while awake     Allergies as of 07/03/2016      Reactions   Ranexa [ranolazine] Shortness Of Breath   Verapamil Other (See Comments)   hypotension      Medication List    TAKE these medications   aspirin 81 MG tablet Take 81 mg by mouth daily.   levothyroxine 25 MCG tablet Commonly known as:  SYNTHROID, LEVOTHROID Take 25 mcg by mouth daily before breakfast.   modafinil 200 MG tablet Commonly known as:  PROVIGIL Take 20 mg by mouth daily.   ondansetron 4 MG disintegrating tablet Commonly known as:  ZOFRAN-ODT Take 4 mg by mouth every 6 (six) hours as needed.   pantoprazole 40 MG tablet Commonly known as:  PROTONIX Take 40 mg by mouth daily.   spironolactone 50 MG tablet Commonly known as:  ALDACTONE Take 50 mg by mouth daily.   TURMERIC PO Take 1 tablet by mouth daily.   VIIBRYD  20 MG Tabs Generic drug:  Vilazodone HCl Take 20 mg by mouth daily.   vitamin B-12 1000 MCG tablet Commonly known as:  CYANOCOBALAMIN Take 1,000 mcg by mouth daily.   Vitamin D (Ergocalciferol) 50000 units Caps capsule Commonly known as:  DRISDOL Take 50,000 Units by mouth See admin instructions. Takes on Mondays and Thursdays         The results of significant diagnostics from this hospitalization (including imaging, microbiology, ancillary and laboratory) are listed below for reference.    Significant Diagnostic Studies: No results found.  Labs: Basic Metabolic Panel:  Recent Labs Lab 07/01/16 1254 07/02/16 0522  NA  --  137  K  --  3.9  CL  --   105  CO2  --  27  GLUCOSE  --  105*  BUN  --  10  CREATININE 1.07* 0.68  CALCIUM  --  8.8*   Liver Function Tests:  Recent Labs Lab 07/02/16 0522  AST 111*  ALT 136*  ALKPHOS 66  BILITOT 0.6  PROT 6.3*  ALBUMIN 3.3*    CBC:  Recent Labs Lab 07/01/16 1110 07/01/16 1254 07/02/16 0522 07/03/16 0500  WBC  --  13.1* 12.6* 10.1  NEUTROABS  --   --  10.0* 7.2  HGB 13.4 13.2 12.7 12.2  HCT 41.8 40.8 38.7 38.7  MCV  --  82.8 82.0 83.9  PLT  --  184 202 197    CBG: No results for input(s): GLUCAP in the last 168 hours.  Active Problems:   Morbid obesity due to excess calories [facility])   Time coordinating discharge: 26min

## 2016-07-04 ENCOUNTER — Inpatient Hospital Stay (HOSPITAL_COMMUNITY): Payer: 59

## 2016-07-04 MED ORDER — LEVOTHYROXINE SODIUM 25 MCG PO TABS
25.0000 ug | ORAL_TABLET | Freq: Every day | ORAL | Status: DC
  Administered 2016-07-04: 25 ug via ORAL
  Filled 2016-07-04: qty 1

## 2016-07-04 MED ORDER — IOPAMIDOL (ISOVUE-370) INJECTION 76%
INTRAVENOUS | Status: AC
  Administered 2016-07-04: 100 mL
  Filled 2016-07-04: qty 100

## 2016-07-04 NOTE — Progress Notes (Signed)
Patient alert and oriented, pain is controlled. Patient is tolerating fluids, advanced to protein shake today, patient is tolerating well. Reviewed Gastric Bypass discharge instructions with patient and patient is able to articulate understanding. Provided information on BELT program, Support Group and [facility] outpatient pharmacy. All questions answered, will continue to monitor.    

## 2016-07-04 NOTE — Progress Notes (Signed)
Patient alert and oriented.  Clear and Full liquid intake without difficulty.  Still states some dizziness when ambulating, but better than yesterday.  Oxygen saturation 92% on room air per continuous pulse ox.  Pt to have CT angio.  No questions or concerns at this time. Will continue to monitor patient progress.

## 2016-07-04 NOTE — Progress Notes (Signed)
  Progress Note: Metabolic and Bariatric Surgery Service   Subjective: Lightheadedness decreased in severity and frequency, slept well, one episode of nausea when coming back from CT. Ambulating well  Objective: Vital signs in last 24 hours: Temp:  [98 F (36.7 C)-99 F (37.2 C)] 98 F (36.7 C) (03/15 0545) Pulse Rate:  [80-81] 81 (03/15 0545) Resp:  [16] 16 (03/15 0545) BP: (116-123)/(65-72) 123/66 (03/15 0545) SpO2:  [90 %-97 %] 92 % (03/15 0850) Weight:  [141.1 kg (311 lb 1.1 oz)] 141.1 kg (311 lb 1.1 oz) (03/15 0135) Last BM Date: 06/30/16  Intake/Output from previous day: 03/14 0701 - 03/15 0700 In: 3580 [P.O.:1280; I.V.:2300] Out: 5050 [Urine:5050] Intake/Output this shift: Total I/O In: 503.3 [I.V.:503.3] Out: 1400 [Urine:1400]  Lungs: CTAB  Cardiovascular: RRR  Abd: soft, ATTP, ND  Extremities: no edema  Neuro: AOx4  Lab Results: CBC   Recent Labs  07/02/16 0522 07/03/16 0500  WBC 12.6* 10.1  HGB 12.7 12.2  HCT 38.7 38.7  PLT 202 197   BMET  Recent Labs  07/01/16 1254 07/02/16 0522  NA  --  137  K  --  3.9  CL  --  105  CO2  --  27  GLUCOSE  --  105*  BUN  --  10  CREATININE 1.07* 0.68  CALCIUM  --  8.8*   PT/INR No results for input(s): LABPROT, INR in the last 72 hours. ABG No results for input(s): PHART, HCO3 in the last 72 hours.  Invalid input(s): PCO2, PO2  Studies/Results:  Anti-infectives: Anti-infectives    Start     Dose/Rate Route Frequency Ordered Stop   07/01/16 0545  cefoTEtan in Dextrose 5% (CEFOTAN) IVPB 2 g     2 g Intravenous On call to O.R. 07/01/16 0545 07/01/16 0743      Medications: Scheduled Meds: . enoxaparin (LOVENOX) injection  30 mg Subcutaneous Q12H  . levothyroxine  25 mcg Oral QAC breakfast  . pantoprazole (PROTONIX) IV  40 mg Intravenous QHS  . protein supplement shake  2 oz Oral Q2H   Continuous Infusions: . sodium chloride 100 mL/hr at 07/04/16 1102   PRN Meds:.oxyCODONE **AND**  acetaminophen, acetaminophen, morphine injection, ondansetron (ZOFRAN) IV  Assessment/Plan: Patient Active Problem List   Diagnosis Date Noted  . Morbid obesity due to excess calories ([place]) 07/01/2016  . Solitary pulmonary nodule on lung CT   . Narcolepsy   . Vitamin B 12 deficiency   . Mild diastolic dysfunction   . Obstructive sleep apnea    s/p Procedure(s): LAPAROSCOPIC ROUX-EN-Y GASTRIC BYPASS WITH HIATAL HERNIA REPAIR, UPPER ENDO UPPER GI ENDOSCOPY 07/01/2016 -CT obtained due to dyspnea and desaturations shows no PE or pneumonia -continue monitoring -likely home today  Disposition:  LOS: 3 days  The patient should be discharged from the hospital today  Mickeal Skinner, MD [phone] [facility]

## 2016-07-04 NOTE — Progress Notes (Signed)
  Progress Note: Metabolic and Bariatric Surgery Service   Subjective: Some shortness of breath  Objective: Lungs: CTAB  Cardiovascular: RRR  Abd: soft, ATTP, incisions c/d/i  Extremities: no edema  Neuro: AOx4  Lab Results: CBC   Recent Labs  07/02/16 0522 07/03/16 0500  WBC 12.6* 10.1  HGB 12.7 12.2  HCT 38.7 38.7  PLT 202 197   BMET  Recent Labs  07/01/16 1254 07/02/16 0522  NA  --  137  K  --  3.9  CL  --  105  CO2  --  27  GLUCOSE  --  105*  BUN  --  10  CREATININE 1.07* 0.68  CALCIUM  --  8.8*   PT/INR No results for input(s): LABPROT, INR in the last 72 hours. ABG No results for input(s): PHART, HCO3 in the last 72 hours.  Invalid input(s): PCO2, PO2  Studies/Results:  Anti-infectives: Anti-infectives    Start     Dose/Rate Route Frequency Ordered Stop   07/01/16 0545  cefoTEtan in Dextrose 5% (CEFOTAN) IVPB 2 g     2 g Intravenous On call to O.R. 07/01/16 0545 07/01/16 0743      Medications: Scheduled Meds: . enoxaparin (LOVENOX) injection  30 mg Subcutaneous Q12H  . levothyroxine  25 mcg Oral QAC breakfast  . pantoprazole (PROTONIX) IV  40 mg Intravenous QHS  . protein supplement shake  2 oz Oral Q2H   Continuous Infusions: . sodium chloride 100 mL/hr at 07/04/16 1102   PRN Meds:.oxyCODONE **AND** acetaminophen, acetaminophen, morphine injection, ondansetron (ZOFRAN) IV  Assessment/Plan: Patient Active Problem List   Diagnosis Date Noted  . Morbid obesity due to excess calories ([place]) 07/01/2016  . Solitary pulmonary nodule on lung CT   . Narcolepsy   . Vitamin B 12 deficiency   . Mild diastolic dysfunction   . Obstructive sleep apnea    s/p Procedure(s): LAPAROSCOPIC ROUX-EN-Y GASTRIC BYPASS WITH HIATAL HERNIA REPAIR, UPPER ENDO UPPER GI ENDOSCOPY 07/01/2016 -some desaturations will continue to monitor and attempt to wean O2 -reaching goal on oral intake  Disposition:  LOS: 3 days  The patient does not meet criteria for  discharge because She has desaturations and lightheadedness with ambulation and requires further monitoring.  Mickeal Skinner, MD [phone] [facility]

## 2016-07-05 ENCOUNTER — Inpatient Hospital Stay (HOSPITAL_COMMUNITY)
Admission: EM | Admit: 2016-07-05 | Discharge: 2016-07-08 | DRG: 315 | Disposition: A | Discharge status: Home / Self Care | Payer: 59 | Attending: General Surgery | Admitting: General Surgery

## 2016-07-05 ENCOUNTER — Encounter (HOSPITAL_COMMUNITY): Payer: Self-pay | Admitting: Emergency Medicine

## 2016-07-05 DIAGNOSIS — Z9049 Acquired absence of other specified parts of digestive tract: Secondary | ICD-10-CM

## 2016-07-05 DIAGNOSIS — Z823 Family history of stroke: Secondary | ICD-10-CM | POA: Diagnosis not present

## 2016-07-05 DIAGNOSIS — Z79899 Other long term (current) drug therapy: Secondary | ICD-10-CM | POA: Diagnosis not present

## 2016-07-05 DIAGNOSIS — R0602 Shortness of breath: Secondary | ICD-10-CM

## 2016-07-05 DIAGNOSIS — Z886 Allergy status to analgesic agent status: Secondary | ICD-10-CM | POA: Diagnosis not present

## 2016-07-05 DIAGNOSIS — Z90710 Acquired absence of both cervix and uterus: Secondary | ICD-10-CM

## 2016-07-05 DIAGNOSIS — Z833 Family history of diabetes mellitus: Secondary | ICD-10-CM

## 2016-07-05 DIAGNOSIS — J9811 Atelectasis: Secondary | ICD-10-CM | POA: Diagnosis present

## 2016-07-05 DIAGNOSIS — R9431 Abnormal electrocardiogram [ECG] [EKG]: Secondary | ICD-10-CM | POA: Diagnosis not present

## 2016-07-05 DIAGNOSIS — G4733 Obstructive sleep apnea (adult) (pediatric): Secondary | ICD-10-CM | POA: Diagnosis present

## 2016-07-05 DIAGNOSIS — E538 Deficiency of other specified B group vitamins: Secondary | ICD-10-CM | POA: Diagnosis present

## 2016-07-05 DIAGNOSIS — R55 Syncope and collapse: Secondary | ICD-10-CM | POA: Diagnosis present

## 2016-07-05 DIAGNOSIS — I950 Idiopathic hypotension: Secondary | ICD-10-CM | POA: Diagnosis not present

## 2016-07-05 DIAGNOSIS — Z8542 Personal history of malignant neoplasm of other parts of uterus: Secondary | ICD-10-CM

## 2016-07-05 DIAGNOSIS — E861 Hypovolemia: Secondary | ICD-10-CM | POA: Diagnosis present

## 2016-07-05 DIAGNOSIS — Z888 Allergy status to other drugs, medicaments and biological substances status: Secondary | ICD-10-CM

## 2016-07-05 DIAGNOSIS — R42 Dizziness and giddiness: Secondary | ICD-10-CM | POA: Diagnosis present

## 2016-07-05 DIAGNOSIS — I5032 Chronic diastolic (congestive) heart failure: Secondary | ICD-10-CM | POA: Diagnosis present

## 2016-07-05 DIAGNOSIS — G47419 Narcolepsy without cataplexy: Secondary | ICD-10-CM | POA: Diagnosis present

## 2016-07-05 DIAGNOSIS — I959 Hypotension, unspecified: Principal | ICD-10-CM | POA: Diagnosis present

## 2016-07-05 DIAGNOSIS — Z9884 Bariatric surgery status: Secondary | ICD-10-CM

## 2016-07-05 DIAGNOSIS — E860 Dehydration: Secondary | ICD-10-CM | POA: Diagnosis present

## 2016-07-05 DIAGNOSIS — I519 Heart disease, unspecified: Secondary | ICD-10-CM | POA: Diagnosis present

## 2016-07-05 LAB — COMPREHENSIVE METABOLIC PANEL
ALBUMIN: 3.2 g/dL — AB (ref 3.5–5.0)
ALK PHOS: 89 U/L (ref 38–126)
ALT: 84 U/L — AB (ref 14–54)
AST: 32 U/L (ref 15–41)
Anion gap: 5 (ref 5–15)
BUN: 22 mg/dL — ABNORMAL HIGH (ref 6–20)
CALCIUM: 9 mg/dL (ref 8.9–10.3)
CHLORIDE: 105 mmol/L (ref 101–111)
CO2: 30 mmol/L (ref 22–32)
CREATININE: 1.03 mg/dL — AB (ref 0.44–1.00)
GFR calc Af Amer: >60 mL/min (ref >60)
GFR calc non Af Amer: >60 mL/min (ref >60)
GLUCOSE: 115 mg/dL — AB (ref 65–99)
Potassium: 3.6 mmol/L (ref 3.5–5.1)
SODIUM: 140 mmol/L (ref 135–145)
Total Bilirubin: 0.5 mg/dL (ref 0.3–1.2)
Total Protein: 6.3 g/dL — ABNORMAL LOW (ref 6.5–8.1)

## 2016-07-05 LAB — URINALYSIS, ROUTINE W REFLEX MICROSCOPIC
Bilirubin Urine: NEGATIVE
Glucose, UA: NEGATIVE mg/dL
HGB URINE DIPSTICK: NEGATIVE
Ketones, ur: 5 mg/dL — AB
LEUKOCYTES UA: NEGATIVE
NITRITE: NEGATIVE
PH: 5 (ref 5.0–8.0)
PROTEIN: 30 mg/dL — AB
Specific Gravity, Urine: 1.027 (ref 1.005–1.030)

## 2016-07-05 LAB — CORTISOL: Cortisol, Plasma: 17.1 ug/dL

## 2016-07-05 LAB — CBC
HCT: 37.8 % (ref 36.0–46.0)
HEMOGLOBIN: 12.2 g/dL (ref 12.0–15.0)
MCH: 26.5 pg (ref 26.0–34.0)
MCHC: 32.3 g/dL (ref 30.0–36.0)
MCV: 82 fL (ref 78.0–100.0)
PLATELETS: 235 10*3/uL (ref 150–400)
RBC: 4.61 MIL/uL (ref 3.87–5.11)
RDW: 15.3 % (ref 11.5–15.5)
WBC: 10.9 10*3/uL — ABNORMAL HIGH (ref 4.0–10.5)

## 2016-07-05 LAB — BRAIN NATRIURETIC PEPTIDE: B Natriuretic Peptide: 61.9 pg/mL (ref 0.0–100.0)

## 2016-07-05 LAB — TSH: TSH: 2.631 u[IU]/mL (ref 0.350–4.500)

## 2016-07-05 LAB — LIPASE, BLOOD: Lipase: 21 U/L (ref 11–51)

## 2016-07-05 LAB — I-STAT CG4 LACTIC ACID, ED: Lactic Acid, Venous: 1.19 mmol/L (ref 0.5–1.9)

## 2016-07-05 MED ORDER — PANTOPRAZOLE SODIUM 40 MG PO TBEC
40.0000 mg | DELAYED_RELEASE_TABLET | Freq: Every day | ORAL | Status: DC
  Administered 2016-07-06 – 2016-07-08 (×3): 40 mg via ORAL
  Filled 2016-07-05 (×3): qty 1

## 2016-07-05 MED ORDER — SODIUM CHLORIDE 0.9 % IV SOLN
Freq: Once | INTRAVENOUS | Status: AC
  Administered 2016-07-05: 1000 mL/h via INTRAVENOUS

## 2016-07-05 MED ORDER — POLYETHYLENE GLYCOL 3350 17 G PO PACK
17.0000 g | PACK | Freq: Every day | ORAL | Status: DC
  Filled 2016-07-05 (×2): qty 1

## 2016-07-05 MED ORDER — ACETAMINOPHEN 650 MG RE SUPP: 650.0000 mg | Freq: Four times a day (QID) | RECTAL | Status: DC | PRN

## 2016-07-05 MED ORDER — FENTANYL CITRATE (PF) 100 MCG/2ML IJ SOLN: 25.0000 ug | INTRAMUSCULAR | Status: DC | PRN

## 2016-07-05 MED ORDER — VITAMIN B-12 1000 MCG PO TABS
1000.0000 ug | ORAL_TABLET | Freq: Every day | ORAL | Status: DC
  Administered 2016-07-05 – 2016-07-08 (×4): 1000 ug via ORAL
  Filled 2016-07-05 (×4): qty 1

## 2016-07-05 MED ORDER — MODAFINIL 200 MG PO TABS
200.0000 mg | ORAL_TABLET | Freq: Every day | ORAL | Status: DC
  Administered 2016-07-06 – 2016-07-08 (×3): 200 mg via ORAL
  Filled 2016-07-05 (×3): qty 1

## 2016-07-05 MED ORDER — OXYCODONE HCL 5 MG/5ML PO SOLN: 5.0000 mg | ORAL | Status: DC | PRN

## 2016-07-05 MED ORDER — ONDANSETRON 4 MG PO TBDP
4.0000 mg | ORAL_TABLET | Freq: Four times a day (QID) | ORAL | Status: DC | PRN
  Administered 2016-07-06 – 2016-07-08 (×2): 4 mg via ORAL
  Filled 2016-07-05: qty 1

## 2016-07-05 MED ORDER — ACETAMINOPHEN 325 MG PO TABS
650.0000 mg | ORAL_TABLET | Freq: Four times a day (QID) | ORAL | Status: DC | PRN
  Administered 2016-07-06 (×2): 650 mg via ORAL
  Filled 2016-07-05 (×2): qty 2

## 2016-07-05 MED ORDER — LACTATED RINGERS IV BOLUS (SEPSIS)
1000.0000 mL | Freq: Three times a day (TID) | INTRAVENOUS | Status: DC | PRN
  Administered 2016-07-05: 1000 mL via INTRAVENOUS
  Filled 2016-07-05: qty 1000

## 2016-07-05 MED ORDER — ONDANSETRON 4 MG PO TBDP
4.0000 mg | ORAL_TABLET | Freq: Four times a day (QID) | ORAL | Status: DC | PRN
  Filled 2016-07-05: qty 1

## 2016-07-05 MED ORDER — LACTATED RINGERS IV SOLN
INTRAVENOUS | Status: DC
  Administered 2016-07-06 – 2016-07-07 (×3): via INTRAVENOUS

## 2016-07-05 MED ORDER — LEVOTHYROXINE SODIUM 25 MCG PO TABS: 25.0000 ug | ORAL_TABLET | Freq: Every day | ORAL | Status: DC

## 2016-07-05 MED ORDER — SIMETHICONE 80 MG PO CHEW
40.0000 mg | CHEWABLE_TABLET | Freq: Four times a day (QID) | ORAL | Status: DC | PRN
  Filled 2016-07-05: qty 1

## 2016-07-05 MED ORDER — VILAZODONE HCL 20 MG PO TABS
20.0000 mg | ORAL_TABLET | Freq: Every day | ORAL | Status: DC
  Administered 2016-07-06 – 2016-07-07 (×2): 20 mg via ORAL
  Filled 2016-07-05 (×4): qty 1

## 2016-07-05 MED ORDER — ONDANSETRON HCL 4 MG/2ML IJ SOLN
4.0000 mg | Freq: Four times a day (QID) | INTRAMUSCULAR | Status: DC | PRN
  Administered 2016-07-06: 4 mg via INTRAVENOUS
  Filled 2016-07-05 (×2): qty 2

## 2016-07-05 MED ORDER — LIP MEDEX EX OINT
1.0000 "application " | TOPICAL_OINTMENT | Freq: Two times a day (BID) | CUTANEOUS | Status: DC
  Administered 2016-07-05 – 2016-07-07 (×4): 1 via TOPICAL
  Filled 2016-07-05: qty 7

## 2016-07-05 MED ORDER — PIPERACILLIN-TAZOBACTAM 3.375 G IVPB
3.3750 g | Freq: Once | INTRAVENOUS | Status: AC
  Administered 2016-07-05: 3.375 g via INTRAVENOUS
  Filled 2016-07-05: qty 50

## 2016-07-05 MED ORDER — ENOXAPARIN SODIUM 40 MG/0.4ML ~~LOC~~ SOLN
40.0000 mg | SUBCUTANEOUS | Status: DC
  Administered 2016-07-06 – 2016-07-07 (×3): 40 mg via SUBCUTANEOUS
  Filled 2016-07-05 (×3): qty 0.4

## 2016-07-05 MED ORDER — DIPHENHYDRAMINE HCL 50 MG/ML IJ SOLN: 12.5000 mg | Freq: Four times a day (QID) | INTRAMUSCULAR | Status: DC | PRN

## 2016-07-05 MED ORDER — METHOCARBAMOL 1000 MG/10ML IJ SOLN
1000.0000 mg | Freq: Four times a day (QID) | INTRAVENOUS | Status: DC | PRN
  Filled 2016-07-05: qty 10

## 2016-07-05 MED ORDER — BISACODYL 10 MG RE SUPP: 10.0000 mg | Freq: Two times a day (BID) | RECTAL | Status: DC | PRN

## 2016-07-05 MED ORDER — HYDRALAZINE HCL 20 MG/ML IJ SOLN: 10.0000 mg | INTRAMUSCULAR | Status: DC | PRN

## 2016-07-05 MED ORDER — MENTHOL 3 MG MT LOZG: 1.0000 | LOZENGE | OROMUCOSAL | Status: DC | PRN

## 2016-07-05 MED ORDER — SODIUM CHLORIDE 0.9 % IV SOLN
Freq: Once | INTRAVENOUS | Status: AC
  Administered 2016-07-05: 500 mL/h via INTRAVENOUS

## 2016-07-05 MED ORDER — PHENOL 1.4 % MT LIQD: 2.0000 | OROMUCOSAL | Status: DC | PRN

## 2016-07-05 MED ORDER — PROCHLORPERAZINE EDISYLATE 5 MG/ML IJ SOLN
5.0000 mg | INTRAMUSCULAR | Status: DC | PRN
  Administered 2016-07-06: 10 mg via INTRAVENOUS
  Filled 2016-07-05: qty 2

## 2016-07-05 MED ORDER — MAGIC MOUTHWASH
15.0000 mL | Freq: Four times a day (QID) | ORAL | Status: DC | PRN
  Filled 2016-07-05: qty 15

## 2016-07-05 MED ORDER — LACTATED RINGERS IV BOLUS (SEPSIS): 2000.0000 mL | Freq: Once | INTRAVENOUS | Status: DC

## 2016-07-05 MED ORDER — DIPHENHYDRAMINE HCL 12.5 MG/5ML PO ELIX: 12.5000 mg | ORAL_SOLUTION | Freq: Four times a day (QID) | ORAL | Status: DC | PRN

## 2016-07-05 NOTE — ED Notes (Signed)
Pt's o2 sats were 88 upon taking vital signs, 2 liters of oxygen via nasal cannula are now administered.

## 2016-07-05 NOTE — ED Triage Notes (Signed)
Patient comes in via [facility] patient reports that after she woke up and got up she got dizzy. Patient was discharged yesterday after having gastric bypass with hernia repair. Patient took BP on dad's cuff at home and was reading low.  Patient attempted to get into car to go to Dr office.  Patient got to car and was too weak and called EMS. Patient vitals with EMS 82/46, 60hr, 18r, CBG 126.  Patient has 22g in left hand and received 672ml NS in route as well as Zofran 4mg .

## 2016-07-05 NOTE — ED Provider Notes (Signed)
[facility] DEPT Provider Note   CSN: [id number] Arrival date & time: 07/05/16  1539     History   Chief Complaint Chief Complaint  Patient presents with  . Dizziness    HPI Jennifer Mayo is a 41 y.o. female.  She presents today via EMS for evaluation of dizziness that started today. Patient was released yesterday from the hospital and is status post Roux-en-Y bypass.  Patient states that she attempted to walk to her car to go to the surgeon's office however became too dizzy and had to call EMS. her dizziness is present when lying down, however it is worse with standing up.  She reports minor shortness of breath  when moving but chest pain/pressure. Patient reports that her abdomen is tender however that her pain has been getting better since surgery. She currently reports no nausea or vomiting. She reports compliance with her surgeons oral intake instructions. She reports that she has had a bowel movement since surgery and is not having difficulty urinating.  She reports that while admitted she had a CT with contrast for evaluation of potential PE as she was consistently having low oxygen sats. Patient has a history of sleep apnea.  The bariatric nurse coordinator was present and added that while admitted patient had one episode of hypotension that resolved with a fluid bolus.        Past Medical History:  Diagnosis Date  . Anemia   . Cancer [facility])    endometrial  . 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 CHF (congestive heart failure) ([place])   . Depression   . History of endometrial cancer    had hysterectomy in August 2015  . Mild diastolic dysfunction   . Narcolepsy   . Obstructive sleep apnea   . Pleural effusion, bilateral    3-4 years   . Solitary pulmonary nodule on lung CT    follow up CT recommended per medical records  . Thyroid disease   . Vitamin B 12 deficiency     Patient Active Problem List   Diagnosis Date Noted  . Hypotension 07/05/2016  . Morbid obesity  due to excess calories ([place]) 07/01/2016  . Solitary pulmonary nodule on lung CT   . Narcolepsy   . Vitamin B 12 deficiency   . Mild diastolic dysfunction   . Obstructive sleep apnea     Past Surgical History:  Procedure Laterality Date  . ABDOMINAL HYSTERECTOMY    . CHOLECYSTECTOMY    . 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 UPPER GI ENDOSCOPY  07/01/2016   Procedure: UPPER GI ENDOSCOPY;  Surgeon: Arta Bruce Kinsinger, MD;  Location: [facility] ORS;  Service: General;;    OB History    No data available       Home Medications    Prior to Admission medications   Medication Sig Start Date End Date Taking? Authorizing Provider  Calcium Carb-Cholecalciferol (CALCIUM 1000 + D PO) Take 1 tablet by mouth 3 (three) times daily.   Yes Historical Provider, MD  modafinil (PROVIGIL) 200 MG tablet Take 200 mg by mouth daily.  05/16/16  Yes Historical Provider, MD  ondansetron (ZOFRAN-ODT) 4 MG disintegrating tablet Take 4 mg by mouth every 6 (six) hours as needed. 06/12/16  Yes Historical Provider, MD  oxyCODONE (ROXICODONE) 5 MG/5ML solution  Take 5-10 mLs by mouth every 4 (four) hours as needed for pain. 06/14/16  Yes Historical Provider, MD  pantoprazole (PROTONIX) 40 MG tablet Take 40 mg by mouth daily. 06/12/16  Yes Historical Provider, MD  PRESCRIPTION MEDICATION Take 1 tablet by mouth 2 (two) times daily. BARIATRIC ADVANTAGE VITAMIN   Yes Historical Provider, MD  spironolactone (ALDACTONE) 50 MG tablet Take 50 mg by mouth daily.   Yes Historical Provider, MD  TURMERIC PO Take 1 tablet by mouth daily.    Yes Historical Provider, MD  Vilazodone HCl (VIIBRYD) 20 MG TABS Take 20 mg by mouth daily.   Yes Historical Provider, MD  vitamin B-12 (CYANOCOBALAMIN) 1000 MCG tablet Take 1,000  mcg by mouth daily.   Yes Historical Provider, MD  levothyroxine (SYNTHROID, LEVOTHROID) 25 MCG tablet Take 25 mcg by mouth daily before breakfast.    Historical Provider, MD    Family History Family History  Problem Relation Age of Onset  . Cancer Mother   . Brain cancer Mother   . Seizures Mother   . Stroke Mother   . Diabetes Father     Social History Social History  Substance Use Topics  . Smoking status: Never Smoker  . Smokeless tobacco: Never Used  . Alcohol use 0.0 oz/week     Comment: very seldom     Allergies   Ranexa [ranolazine]; Nsaids; and Verapamil   Review of Systems Review of Systems  Constitutional: Positive for activity change, appetite change and fatigue. Negative for chills, diaphoresis and fever.  HENT: Negative for ear pain and sore throat.   Eyes: Negative for visual disturbance.  Respiratory: Negative for cough and shortness of breath.   Cardiovascular: Negative for chest pain, palpitations and leg swelling.  Gastrointestinal: Positive for abdominal distention and abdominal pain. Negative for constipation, diarrhea, nausea and vomiting.  Genitourinary: Negative for dysuria and hematuria.  Musculoskeletal: Negative for arthralgias and back pain.  Skin: Negative for color change and rash.  Neurological: Positive for dizziness and light-headedness. Negative for seizures and syncope.  All other systems reviewed and are negative.    Physical Exam Updated Vital Signs BP (!) 75/47 (BP Location: Right Arm)   Pulse 61   Temp 97.9 F (36.6 C) (Oral)   Resp 18   SpO2 (!) 88%   Physical Exam  Constitutional: She is oriented to person, place, and time. She appears well-developed and well-nourished. She is cooperative. No distress.  Patient was alert, able to give accurate history and was not confused.    HENT:  Head: Normocephalic and atraumatic.  Eyes: Conjunctivae are normal.  Neck: Neck supple.  Cardiovascular: Normal rate, regular rhythm, S1  normal, S2 normal, normal heart sounds and intact distal pulses.   No murmur heard. Evaluation of heart sounds limited 2/2 pt body habitus.  Pulmonary/Chest: Effort normal and breath sounds normal. No accessory muscle usage or stridor. No respiratory distress.  Abdominal: Soft. Bowel sounds are normal. She exhibits no distension and no mass. There is generalized tenderness and tenderness in the right upper quadrant. There is no rigidity and no guarding.    Laparoscopic incision sites appear clean, dry and intact.    Musculoskeletal: She exhibits no edema.  Neurological: She is alert and oriented to person, place, and time.  Skin: Skin is warm and dry. She is not diaphoretic. There is pallor.  Psychiatric: She has a normal mood and affect. Her behavior is normal.  Nursing note and vitals reviewed.    ED Treatments /  Results  Labs (all labs ordered are listed, but only abnormal results are displayed) Labs Reviewed  CBC - Abnormal; Notable for the following:       Result Value   WBC 10.9 (*)    All other components within normal limits  COMPREHENSIVE METABOLIC PANEL - Abnormal; Notable for the following:    Glucose, Bld 115 (*)    BUN 22 (*)    Creatinine, Ser 1.03 (*)    Total Protein 6.3 (*)    Albumin 3.2 (*)    ALT 84 (*)    All other components within normal limits  URINALYSIS, ROUTINE W REFLEX MICROSCOPIC - Abnormal; Notable for the following:    Color, Urine AMBER (*)    APPearance HAZY (*)    Ketones, ur 5 (*)    Protein, ur 30 (*)    Bacteria, UA RARE (*)    Squamous Epithelial / LPF 0-5 (*)    All other components within normal limits  CULTURE, BLOOD (ROUTINE X 2)  CULTURE, BLOOD (ROUTINE X 2)  LIPASE, BLOOD  TSH  CORTISOL  HIV ANTIBODY (ROUTINE TESTING)  BASIC METABOLIC PANEL  CBC  I-STAT TROPOININ, ED  I-STAT CG4 LACTIC ACID, ED  I-STAT CG4 LACTIC ACID, ED    EKG  EKG Interpretation  Date/Time:  Friday July 05 2016 16:53:24 EDT Ventricular Rate:   55 PR Interval:    QRS Duration: 105 QT Interval:  447 QTC Calculation: 428 R Axis:   163 Text Interpretation:  Right and left arm electrode reversal, interpretation assumes no reversal Sinus rhythm Right axis deviation Low voltage, precordial leads No significant change since last tracing Confirmed by ISAACS MD, Lysbeth Galas [phone]) on 07/05/2016 4:59:54 PM       Radiology Ct Angio Chest Pe W Or Wo Contrast  Result Date: 07/04/2016 CLINICAL DATA:  Shortness of Breath EXAM: CT ANGIOGRAPHY CHEST WITH CONTRAST TECHNIQUE: Multidetector CT imaging of the chest was performed using the standard protocol during bolus administration of intravenous contrast. Multiplanar CT image reconstructions and MIPs were obtained to evaluate the vascular anatomy. CONTRAST:  100 mL Isovue 370. COMPARISON:  None. FINDINGS: Cardiovascular: Thoracic aorta shows no aneurysmal dilatation or dissection. The pulmonary artery demonstrates a normal branching pattern without evidence of intraluminal filling defect to suggest pulmonary embolism. Mediastinum/Nodes: No significant hilar or mediastinal adenopathy is noted. The thoracic inlet is within normal limits. Lungs/Pleura: Small left pleural effusion is noted as well as mild bibasilar atelectatic changes. No focal nodules or pneumothorax are seen. Upper Abdomen: Within normal limits. Musculoskeletal: No acute abnormality noted. Review of the MIP images confirms the above findings. IMPRESSION: No evidence of pulmonary emboli. Small left pleural effusion and bibasilar atelectatic changes. Electronically Signed   By: Inez Catalina M.D.   On: 07/04/2016 10:59    Procedures Procedures (including critical care time)  Medications Ordered in ED Medications  piperacillin-tazobactam (ZOSYN) IVPB 3.375 g (3.375 g Intravenous New Bag/Given 07/05/16 1806)  pantoprazole (PROTONIX) EC tablet 40 mg (not administered)  oxyCODONE (ROXICODONE) 5 MG/5ML solution 5-10 mg (not administered)   ondansetron (ZOFRAN-ODT) disintegrating tablet 4 mg (not administered)  levothyroxine (SYNTHROID, LEVOTHROID) tablet 25 mcg (not administered)  vitamin B-12 (CYANOCOBALAMIN) tablet 1,000 mcg (not administered)  lactated ringers bolus 2,000 mL (not administered)  lactated ringers infusion (not administered)  acetaminophen (TYLENOL) tablet 650 mg (not administered)    Or  acetaminophen (TYLENOL) suppository 650 mg (not administered)  diphenhydrAMINE (BENADRYL) 12.5 MG/5ML elixir 12.5 mg (not administered)    Or  diphenhydrAMINE (BENADRYL) injection 12.5 mg (not administered)  ondansetron (ZOFRAN-ODT) disintegrating tablet 4 mg (not administered)    Or  ondansetron (ZOFRAN) injection 4 mg (not administered)  simethicone (MYLICON) chewable tablet 40 mg (not administered)  hydrALAZINE (APRESOLINE) injection 10 mg (not administered)  enoxaparin (LOVENOX) injection 40 mg (not administered)  lip balm (CARMEX) ointment 1 application (not administered)  phenol (CHLORASEPTIC) mouth spray 2 spray (not administered)  menthol-cetylpyridinium (CEPACOL) lozenge 3 mg (not administered)  magic mouthwash (not administered)  polyethylene glycol (MIRALAX / GLYCOLAX) packet 17 g (not administered)  bisacodyl (DULCOLAX) suppository 10 mg (not administered)  lactated ringers bolus 1,000 mL (not administered)  prochlorperazine (COMPAZINE) injection 5-10 mg (not administered)  fentaNYL (SUBLIMAZE) injection 25-50 mcg (not administered)  methocarbamol (ROBAXIN) 1,000 mg in dextrose 5 % 50 mL IVPB (not administered)  0.9 %  sodium chloride infusion ( Intravenous Paused 07/05/16 1730)  0.9 %  sodium chloride infusion (1,000 mL/hr Intravenous New Bag/Given 07/05/16 1919)     Initial Impression / Assessment and Plan / ED Course  I have reviewed the triage vital signs and the nursing notes.  Pertinent labs & imaging results that were available during my care of the patient were reviewed by me and considered in my  medical decision making (see chart for details).   16:55- Patient husband in room, Lung sounds unchanged, clear to auscultation bilaterally.  Patient reports no changes in symptoms.     17:57: Recheck of patient.  Lungs clear to auscultation bilaterally.  No symptom change.  One liter bolus ordered.   MDM: Upon arrival patient is hypotensive s/p bariatric surgery.  Fluid bolus of 568ml was ordered, lung sounds reassessed and additional fluid was ordered as patient remained hypotensive.  Despite being hypotensive patient was alert, appropriate conversations, not confused with good peripheral pulses.  Patient is well perfused despite hypotension.  She is afebrile, but given recent surgery ordered a work up for potential infection.  Labs did not show obvious signs of infection.   PA-C Kirichenko spoke with Dr. Johney Maine who agreed to admit the patient .     Final Clinical Impressions(s) / ED Diagnoses   Final diagnoses:  Hypotension, unspecified hypotension type  Near syncope  Dehydration  Status post bariatric surgery    New Prescriptions New Prescriptions   No medications on file     Lorin Glass, [place] 07/05/16 1926    Duffy Bruce, MD 07/06/16 1315

## 2016-07-05 NOTE — Progress Notes (Addendum)
MD came to floor to speak with nurse. Reported pt BP. Standing orders to give LR bolus over 1 hour. MD reminded of pt CHF and pt concerns of fluid overload. Dr. Johney Maine wants to proceed. MD placed new orders and spoke with patient. Will continue to monitor and assess pt status.

## 2016-07-05 NOTE — Consult Note (Addendum)
Medical Consultation   Jennifer Mayo  TKZ:[id number]  DOB: 1976/01/26  DOA: 07/05/2016  PCP: Bonnita Nasuti, MD   Outpatient Specialists:   Requesting physician: Durwin Reges, MD  Reason for consultation: Hypotension.  History of Present Illness: Jennifer Mayo is an 41 y.o. female with a PMH of anemia, endometrial cancer, cardiac microvascular disease, mild diastolic CHF, depression, narcolepsy, obstructive sleep apnea on CPAP, thyroid disease, vitamin B12 deficiency who underwent laparoscopic Roux-en Y gastric bypass on 07/01/2016 and is coming to the ED for evaluation of hypotension and dizziness.  Per patient, will she was hospitalized she felt some dizziness after the procedure. However, she was able to go home yesterday. She states though that once at home her dizziness has worsened. She woke up this morning got up from bed and felt lightheaded. She used her father low pressure cuff at home and noticed that she had a low blood pressure reading. She subsequently got into her vehicle to go to her doctor's office, but felt very weak and had to call EMS.   When EMS arrived she had a blood pressure of 82/46 mmHg with a heart rate of 60. She received 600 mL of normal saline and Zofran 4 mg IVP in route to the hospital. She received 1500 mL of NS in the emergency department and 2000 mL of lactated Ringer on the floor. She states that she feels better, but still lightheaded. She denies fever, chills, productive cough, chest pain, dyspnea, palpitations, diaphoresis, PND, orthopnea or pitting edema of the lower extremities. She has some incisional abdominal pain and mild nausea, but denies emesis, diarrhea, melena or hematochezia. She denies dysuria, frequency or hematuria.  Review of Systems:  ROS As per HPI otherwise 10 point review of systems negative.    Past Medical History: Past Medical History:  Diagnosis Date  . Anemia   . Cancer [facility])    endometrial  . Cardiac  microvascular disease ([place])   . CHF (congestive heart failure) ([place])   . Depression   . History of endometrial cancer    had hysterectomy in August 2015  . Mild diastolic dysfunction   . Narcolepsy   . Obstructive sleep apnea   . Pleural effusion, bilateral    3-4 years   . Solitary pulmonary nodule on lung CT    follow up CT recommended per medical records  . Thyroid disease   . Vitamin B 12 deficiency     Past Surgical History: Past Surgical History:  Procedure Laterality Date  . ABDOMINAL HYSTERECTOMY    . CHOLECYSTECTOMY    . 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 UPPER GI ENDOSCOPY  07/01/2016   Procedure: UPPER GI ENDOSCOPY;  Surgeon: Arta Bruce Kinsinger, MD;  Location: [facility];  Service: General;;     Allergies:   Allergies  Allergen Reactions  . Ranexa [Ranolazine] Shortness Of Breath  . Nsaids     Gastric bypass - ulcer risk  . Verapamil Other (See Comments)    hypotension     Social History:  reports that she has never smoked. She has never used smokeless tobacco. She reports that she drinks alcohol. She reports that she does not use drugs.   Family History: Family History  Problem Relation Age of Onset  . Cancer Mother   . Brain cancer Mother   . Seizures Mother   . Stroke Mother   . Diabetes Father     Physical Exam: Vitals:   07/05/16 1614 07/05/16 1623 07/05/16 1630 07/05/16 1837  BP:  (!) 94/55 (!) 81/51 (!) 75/47  Pulse:   64 61  Resp:    18  Temp:      TempSrc:      SpO2: 91%  (!) 89% (!) 88%    Constitutional: Alert and awake, oriented x3, not in any acute distress. Eyes: PERLA, EOMI, irises appear normal, anicteric sclera,  ENMT: external ears and nose appear normal, normal hearing.             Lips appears dry,  oropharynx mucosa, tongue, posterior pharynx appear normal  Neck: neck appears normal, no masses, normal ROM, no thyromegaly, no JVD  CVS: S1-S2 clear, no murmur rubs or gallops, no LE edema, normal pedal pulses  Respiratory:  clear to auscultation bilaterally, no wheezing, rales or rhonchi. Respiratory effort normal. No accessory muscle use.  Abdomen: Incisions and dressings from laparoscopic procedures without surrounding erythema or discharge, soft, mild epigastric tenderness, no guarding or rebound, nondistended, normal bowel sounds, no hepatosplenomegaly, no hernias  Musculoskeletal: : no cyanosis, clubbing or edema noted bilaterally. Normal muscle tone. Neuro: Cranial nerves II-XII intact, strength, sensation, reflexes Psych: judgement and insight appear normal, stable mood and affect, mental status Skin: no rashes or lesions or ulcers, no induration or nodules    Data reviewed:  I have personally reviewed following labs and imaging studies Labs:  CBC:  Recent Labs Lab 07/01/16 1110 07/01/16 1254 07/02/16 0522 07/03/16 0500 07/05/16 1640  WBC  --  13.1* 12.6* 10.1 10.9*  NEUTROABS  --   --  10.0* 7.2  --   HGB 13.4 13.2 12.7 12.2 12.2  HCT 41.8 40.8 38.7 38.7 37.8  MCV  --  82.8 82.0 83.9 82.0  PLT  --  184 202 197 762    Basic Metabolic Panel:  Recent Labs Lab 07/01/16 1254 07/02/16 0522 07/05/16 1640  NA  --  137 140  K  --  3.9 3.6  CL  --  105 105  CO2  --  27 30  GLUCOSE  --  105* 115*  BUN  --  10 22*  CREATININE 1.07* 0.68 1.03*  CALCIUM  --  8.8* 9.0   GFR Estimated Creatinine Clearance: 104.4 mL/min (A) (by C-G formula based on SCr of 1.03 mg/dL (H)). Liver Function Tests:  Recent Labs Lab 07/02/16 0522 07/05/16 1640  AST 111* 32  ALT 136* 84*  ALKPHOS 66 89  BILITOT 0.6 0.5  PROT 6.3* 6.3*  ALBUMIN 3.3* 3.2*    Recent Labs Lab 07/05/16 1640  LIPASE 21   No results for input(s): AMMONIA in the last 168 hours. Coagulation profile No  results for input(s): INR, PROTIME in the last 168 hours.  Cardiac Enzymes: No results for input(s): CKTOTAL, CKMB, CKMBINDEX, TROPONINI in the last 168 hours. BNP: Invalid input(s): POCBNP CBG: No results for input(s): GLUCAP in the last 168 hours. D-Dimer No results for input(s): DDIMER in the last 72 hours. Hgb A1c No results for input(s): HGBA1C in the last 72 hours. Lipid Profile No results for input(s): CHOL, HDL, LDLCALC, TRIG, CHOLHDL, LDLDIRECT in the last 72 hours. Thyroid function studies  Recent Labs  07/05/16 1724  TSH 2.631   Anemia work up No results  for input(s): VITAMINB12, FOLATE, FERRITIN, TIBC, IRON, RETICCTPCT in the last 72 hours. Urinalysis    Component Value Date/Time   COLORURINE AMBER (A) 07/05/2016 1819   APPEARANCEUR HAZY (A) 07/05/2016 1819   LABSPEC 1.027 07/05/2016 1819   PHURINE 5.0 07/05/2016 1819   GLUCOSEU NEGATIVE 07/05/2016 1819   HGBUR NEGATIVE 07/05/2016 1819   BILIRUBINUR NEGATIVE 07/05/2016 1819   KETONESUR 5 (A) 07/05/2016 1819   PROTEINUR 30 (A) 07/05/2016 1819   NITRITE NEGATIVE 07/05/2016 1819   LEUKOCYTESUR NEGATIVE 07/05/2016 1819     Microbiology No results found for this or any previous visit (from the past 240 hour(s)).     Inpatient Medications:   Scheduled Meds: . [START ON 07/06/2016] enoxaparin (LOVENOX) injection  40 mg Subcutaneous Q24H  . [START ON 07/06/2016] levothyroxine  25 mcg Oral QAC breakfast  . lip balm  1 application Topical BID  . [START ON 07/06/2016] pantoprazole  40 mg Oral Daily  . polyethylene glycol  17 g Oral Daily  . vitamin B-12  1,000 mcg Oral Daily   Continuous Infusions: . sodium chloride    . lactated ringers    . lactated ringers    . lactated ringers    . methocarbamol (ROBAXIN)  IV    . piperacillin-tazobactam (ZOSYN)  IV 3.375 g (07/05/16 1806)     Radiological Exams on Admission: Ct Angio Chest Pe W Or Wo Contrast  Result Date: 07/04/2016 CLINICAL DATA:  Shortness of  Breath EXAM: CT ANGIOGRAPHY CHEST WITH CONTRAST TECHNIQUE: Multidetector CT imaging of the chest was performed using the standard protocol during bolus administration of intravenous contrast. Multiplanar CT image reconstructions and MIPs were obtained to evaluate the vascular anatomy. CONTRAST:  100 mL Isovue 370. COMPARISON:  None. FINDINGS: Cardiovascular: Thoracic aorta shows no aneurysmal dilatation or dissection. The pulmonary artery demonstrates a normal branching pattern without evidence of intraluminal filling defect to suggest pulmonary embolism. Mediastinum/Nodes: No significant hilar or mediastinal adenopathy is noted. The thoracic inlet is within normal limits. Lungs/Pleura: Small left pleural effusion is noted as well as mild bibasilar atelectatic changes. No focal nodules or pneumothorax are seen. Upper Abdomen: Within normal limits. Musculoskeletal: No acute abnormality noted. Review of the MIP images confirms the above findings. IMPRESSION: No evidence of pulmonary emboli. Small left pleural effusion and bibasilar atelectatic changes. Electronically Signed   By: Inez Catalina M.D.   On: 07/04/2016 10:59    Impression/Recommendations Principal Problem:   Hypotension Likely due to volume depletion and maybe narcotics induced. She is feeling better after IV hydration. Monitor intake and output. Will check echocardiogram in a.m. Consider evaluation by cardiology and/or neurology if no resolution and etiology not clear.  Active Problems:   Narcolepsy Continue Provigil 200 mg by mouth daily.    Mild diastolic dysfunction The patient at this time looks volume depleted. Her lips look dry and she may have been having difficulty with oral intake. Her BNP is normal    Obstructive sleep apnea Ordered nocturnal CPAP.    Morbid obesity due to excess calories (HCC)   S/P laparoscopic gastric bypass Continue postop care per surgery.   Thank you for this consultation.  Our [facility] hospitalist  team will follow the patient with you.   Time Spent: 55 minutes were spent during the process of this consultation including chart review, documentation and direct patient contact.  Reubin Milan M.D. [facility] 07/05/2016, 7:06 PM

## 2016-07-05 NOTE — H&P (Signed)
[address] [place], [place] [address] Phone: [phone] FAX: [phone]     Jennifer Mayo  1975-09-27 [id number]  CARE TEAM:  PCP: Bonnita Nasuti, MD  Outpatient Care Team: Patient Care Team: Raelyn Number, MD as PCP - General (Internal Medicine)  Inpatient Treatment Team: Treatment Team: Attending Provider: Mickeal Skinner, MD; Registered Nurse: Kirt Boys, RN; Physician Assistant: Lorin Glass, PA; Technician: Enis Gash, NT   This patient is a 41 y.o.female who presents today for surgical evaluation at the request of Dr Sharyn Blitz.   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  Had some lightheadedness.  Eventually discharged on postoperative day #3.  Patient felt more lightheaded and dizzy.  Thought she was going to pass out.  Came back to the emergency room with concerns.  Tolerating most liquids.  No nausea or vomiting.  A few days ago she was concerned that she was short of breath.  CT and a chest ruled out pulmonary embolism or any significant heart failure edema.  Patient recalls being told she was in heart failure.  She is worried that she may have that again.  Assessment  Jennifer Mayo  41 y.o. female       Problem List:  Principal Problem:   Hypotension Active Problems:   Narcolepsy   Mild diastolic dysfunction   Obstructive sleep apnea   Morbid obesity due to excess calories (HCC)   Dizziness and hypotension with no tachycardia or peritonitis.  History of lightheadedness and borderline hypotension in the past  Plan:  Admit  IV fluids  Medicine consult to help with questionable nonabdominal etiology of dizziness and hypotension.?  Need for telemetry.  Check BNP.  Consider echocardiogram.   Absence of any major edema from CT scan the other day argues against  significant heart failure edema.  No strong evidence of intra-abdominal catastrophe orienting repeat CT scan of abdomen.  Repeat labs and exams.  -Reconsider CT scan if abdominal pain worsens, nausea/vomiting or retching, or evidence of infection. -VTE prophylaxis- SCDs, etc -mobilize as tolerated to help recovery    Adin Hector, M.D., F.A.C.S. Gastrointestinal and Minimally Invasive Surgery [facility] [address], [place], [place] [address] [phone] Main / Paging   07/05/2016      Past Medical History:  Diagnosis Date  . Anemia   . [facility])    endometrial  . 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 CHF (congestive heart failure) ([place])   . Depression   . History of endometrial cancer    had hysterectomy in August 2015  . Mild diastolic dysfunction   . Narcolepsy   . Obstructive sleep apnea   . Pleural effusion, bilateral    3-4 years   . Solitary pulmonary nodule on lung CT    follow up CT recommended per medical records  . Thyroid disease   . Vitamin B 12 deficiency     Past Surgical History:  Procedure Laterality Date  . ABDOMINAL HYSTERECTOMY    . CHOLECYSTECTOMY    . 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 UPPER GI ENDOSCOPY  07/01/2016   Procedure:  UPPER GI ENDOSCOPY;  Surgeon: Arta Bruce Kinsinger, MD;  Location: [facility];  Service: General;;    Social History   Social History  . Marital status: Married    Spouse name: N/A  . Number of children: N/A  . Years of education: N/A   Occupational History  . Not on file.   Social History Main Topics  . Smoking status: Never Smoker  . Smokeless tobacco: Never Used  . Alcohol use 0.0 oz/week     Comment: very seldom  . Drug use: No   . Sexual activity: Not on file   Other Topics Concern  . Not on file   Social History Narrative   One cup of caffeine daily.    Family History  Problem Relation Age of Onset  . Cancer Mother   . Brain cancer Mother   . Seizures Mother   . Stroke Mother   . Diabetes Father     Current Facility-Administered Medications  Medication Dose Route Frequency Provider Last Rate Last Dose  . 0.9 %  sodium chloride infusion   Intravenous Once Lorin Glass, [place]      . acetaminophen (TYLENOL) tablet 650 mg  650 mg Oral Q6H PRN Michael Boston, MD       Or  . acetaminophen (TYLENOL) suppository 650 mg  650 mg Rectal Q6H PRN Michael Boston, MD      . bisacodyl (DULCOLAX) suppository 10 mg  10 mg Rectal Q12H PRN Michael Boston, MD      . diphenhydrAMINE (BENADRYL) 12.5 MG/5ML elixir 12.5 mg  12.5 mg Oral Q6H PRN Michael Boston, MD       Or  . diphenhydrAMINE (BENADRYL) injection 12.5 mg  12.5 mg Intravenous Q6H PRN Michael Boston, MD      . Derrill Memo ON 07/06/2016] enoxaparin (LOVENOX) injection 40 mg  40 mg Subcutaneous Q24H Michael Boston, MD      . fentaNYL (SUBLIMAZE) injection 25-50 mcg  25-50 mcg Intravenous Q1H PRN Michael Boston, MD      . hydrALAZINE (APRESOLINE) injection 10 mg  10 mg Intravenous Q2H PRN Michael Boston, MD      . lactated ringers bolus 1,000 mL  1,000 mL Intravenous Q8H PRN Michael Boston, MD      . lactated ringers bolus 2,000 mL  2,000 mL Intravenous Once Michael Boston, MD      . lactated ringers infusion   Intravenous Continuous Michael Boston, MD      . Derrill Memo ON 07/06/2016] levothyroxine (SYNTHROID, LEVOTHROID) tablet 25 mcg  25 mcg Oral QAC breakfast Michael Boston, MD      . lip balm (CARMEX) ointment 1 application  1 application Topical BID Michael Boston, MD      . magic mouthwash  15 mL Oral QID PRN Michael Boston, MD      . menthol-cetylpyridinium (CEPACOL) lozenge 3 mg  1 lozenge Oral PRN Michael Boston, MD      . methocarbamol (ROBAXIN) 1,000 mg in dextrose 5 % 50 mL IVPB  1,000 mg  Intravenous Q6H PRN Michael Boston, MD      . ondansetron (ZOFRAN-ODT) disintegrating tablet 4 mg  4 mg Oral Q6H PRN Michael Boston, MD       Or  . ondansetron [facility]) injection 4 mg  4 mg Intravenous Q6H PRN Michael Boston, MD      . ondansetron (ZOFRAN-ODT) disintegrating tablet 4 mg  4 mg Oral Q6H PRN Michael Boston, MD      . oxyCODONE (ROXICODONE) 5 MG/5ML solution  5-10 mg  5-10 mg Oral Q4H PRN Michael Boston, MD      . pantoprazole (PROTONIX) EC tablet 40 mg  40 mg Oral Daily Michael Boston, MD      . phenol [facility]) mouth spray 2 spray  2 spray Mouth/Throat PRN Michael Boston, MD      . piperacillin-tazobactam (ZOSYN) IVPB 3.375 g  3.375 g Intravenous Once Tatyana Kirichenko, PA-C 12.5 mL/hr at 07/05/16 1806 3.375 g at 07/05/16 1806  . polyethylene glycol (MIRALAX / GLYCOLAX) packet 17 g  17 g Oral Daily Michael Boston, MD      . prochlorperazine (COMPAZINE) injection 5-10 mg  5-10 mg Intravenous Q4H PRN Michael Boston, MD      . simethicone [facility]) chewable tablet 40 mg  40 mg Oral Q6H PRN Michael Boston, MD      . vitamin B-12 (CYANOCOBALAMIN) tablet 1,000 mcg  1,000 mcg Oral Daily Michael Boston, MD       Current Outpatient Prescriptions  Medication Sig Dispense Refill  . Calcium Carb-Cholecalciferol (CALCIUM 1000 + D PO) Take 1 tablet by mouth 3 (three) times daily.    . modafinil (PROVIGIL) 200 MG tablet Take 200 mg by mouth daily.     . ondansetron (ZOFRAN-ODT) 4 MG disintegrating tablet Take 4 mg by mouth every 6 (six) hours as needed.  0  . oxyCODONE (ROXICODONE) 5 MG/5ML solution Take 5-10 mLs by mouth every 4 (four) hours as needed for pain.  0  . pantoprazole (PROTONIX) 40 MG tablet Take 40 mg by mouth daily.  3  . PRESCRIPTION MEDICATION Take 1 tablet by mouth 2 (two) times daily. BARIATRIC ADVANTAGE VITAMIN    . spironolactone (ALDACTONE) 50 MG tablet Take 50 mg by mouth daily.    . TURMERIC PO Take 1 tablet by mouth daily.     . Vilazodone HCl (VIIBRYD) 20 MG TABS Take 20 mg by mouth  daily.    . vitamin B-12 (CYANOCOBALAMIN) 1000 MCG tablet Take 1,000 mcg by mouth daily.    Marland Kitchen levothyroxine (SYNTHROID, LEVOTHROID) 25 MCG tablet Take 25 mcg by mouth daily before breakfast.       Allergies  Allergen Reactions  . Ranexa [Ranolazine] Shortness Of Breath  . Nsaids     Gastric bypass - ulcer risk  . Verapamil Other (See Comments)    hypotension    ROS: Constitutional:  No fevers, chills, sweats.  Weight stable Eyes:  No vision changes, No discharge HENT:  No sore throats, nasal drainage Lymph: No neck swelling, No bruising easily Pulmonary:  No cough, productive sputum.  OSA CV: No orthopnea, PND  Patient walks 20 minutes for about 1 miles without difficulty.  No exertional chest/neck/shoulder/arm pain. GI: No personal nor family history of GI/colon cancer, inflammatory bowel disease, irritable bowel syndrome, allergy such as Celiac Sprue, dietary/dairy problems, colitis, ulcers nor gastritis.  No recent sick contacts/gastroenteritis.  No travel outside the country.  No changes in diet. Renal: No UTIs, No hematuria Genital:  No drainage, bleeding, masses Musculoskeletal: No severe joint pain.  Good ROM major joints Skin:  No sores or lesions.  No rashes Heme/Lymph:  No easy bleeding.  No swollen lymph nodes Neuro: No focal weakness/numbness.  No seizures Psych: No suicidal ideation.  No hallucinations  BP (!) 81/51   Pulse 64   Temp 97.9 F (36.6 C) (Oral)   Resp 19   SpO2 (!) 89%   Physical Exam: General: Pt awake/alert/oriented x4 in no major acute distress Eyes: PERRL, normal  EOM. Sclera nonicteric Neuro: CN II-XII intact w/o focal sensory/motor deficits. Lymph: No head/neck/groin lymphadenopathy Psych:  No delerium/psychosis/paranoia HENT: Normocephalic, Mucus membranes moist.  No thrush Neck: Supple, No tracheal deviation Chest: No pain.  Good respiratory excursion..  CTA bil. CV:  Pulses intact.  Regular rhythm Abdomen: Soft, Obese.  Nondistended.   Min TTP at incisions.  No incarcerated hernias. Gen:  No inguinal hernias.  No inguinal lymphadenopathy.   Ext:  SCDs BLE.  No significant edema.  No cyanosis Skin: No petechiae / purpurea.  No major sores Musculoskeletal: No severe joint pain.  Good ROM major joints   Results:   Labs: Results for orders placed or performed during the hospital encounter of 07/05/16 (from the past 48 hour(s))  CBC     Status: Abnormal   Collection Time: 07/05/16  4:40 PM  Result Value Ref Range   WBC 10.9 (H) 4.0 - 10.5 K/uL   RBC 4.61 3.87 - 5.11 MIL/uL   Hemoglobin 12.2 12.0 - 15.0 g/dL   HCT 37.8 36.0 - 46.0 %   MCV 82.0 78.0 - 100.0 fL   MCH 26.5 26.0 - 34.0 pg   MCHC 32.3 30.0 - 36.0 g/dL   RDW 15.3 11.5 - 15.5 %   Platelets 235 150 - 400 K/uL  Comprehensive metabolic panel     Status: Abnormal   Collection Time: 07/05/16  4:40 PM  Result Value Ref Range   Sodium 140 135 - 145 mmol/L   Potassium 3.6 3.5 - 5.1 mmol/L   Chloride 105 101 - 111 mmol/L   CO2 30 22 - 32 mmol/L   Glucose, Bld 115 (H) 65 - 99 mg/dL   BUN 22 (H) 6 - 20 mg/dL   Creatinine, Ser 1.03 (H) 0.44 - 1.00 mg/dL   Calcium 9.0 8.9 - 10.3 mg/dL   Total Protein 6.3 (L) 6.5 - 8.1 g/dL   Albumin 3.2 (L) 3.5 - 5.0 g/dL   AST 32 15 - 41 U/L   ALT 84 (H) 14 - 54 U/L   Alkaline Phosphatase 89 38 - 126 U/L   Total Bilirubin 0.5 0.3 - 1.2 mg/dL   GFR calc non Af Amer >60 >60 mL/min   GFR calc Af Amer >60 >60 mL/min    Comment: (NOTE) The eGFR has been calculated using the CKD EPI equation. This calculation has not been validated in all clinical situations. eGFR's persistently <60 mL/min signify possible Chronic Kidney Disease.    Anion gap 5 5 - 15  Lipase, blood     Status: None   Collection Time: 07/05/16  4:40 PM  Result Value Ref Range   Lipase 21 11 - 51 U/L  I-Stat CG4 Lactic Acid, ED     Status: None   Collection Time: 07/05/16  4:50 PM  Result Value Ref Range   Lactic Acid, Venous 1.19 0.5 - 1.9 mmol/L  TSH      Status: None   Collection Time: 07/05/16  5:24 PM  Result Value Ref Range   TSH 2.631 0.350 - 4.500 uIU/mL    Comment: Performed by a 3rd Generation assay with a functional sensitivity of <=0.01 uIU/mL.    Imaging / Studies: Ct Angio Chest Pe W Or Wo Contrast  Result Date: 07/04/2016 CLINICAL DATA:  Shortness of Breath EXAM: CT ANGIOGRAPHY CHEST WITH CONTRAST TECHNIQUE: Multidetector CT imaging of the chest was performed using the standard protocol during bolus administration of intravenous contrast. Multiplanar CT image reconstructions and MIPs were obtained to  evaluate the vascular anatomy. CONTRAST:  100 mL Isovue 370. COMPARISON:  None. FINDINGS: Cardiovascular: Thoracic aorta shows no aneurysmal dilatation or dissection. The pulmonary artery demonstrates a normal branching pattern without evidence of intraluminal filling defect to suggest pulmonary embolism. Mediastinum/Nodes: No significant hilar or mediastinal adenopathy is noted. The thoracic inlet is within normal limits. Lungs/Pleura: Small left pleural effusion is noted as well as mild bibasilar atelectatic changes. No focal nodules or pneumothorax are seen. Upper Abdomen: Within normal limits. Musculoskeletal: No acute abnormality noted. Review of the MIP images confirms the above findings. IMPRESSION: No evidence of pulmonary emboli. Small left pleural effusion and bibasilar atelectatic changes. Electronically Signed   By: Inez Catalina M.D.   On: 07/04/2016 10:59    Medications / Allergies: per chart  Antibiotics: Anti-infectives    Start     Dose/Rate Route Frequency Ordered Stop   07/05/16 1700  piperacillin-tazobactam (ZOSYN) IVPB 3.375 g     3.375 g 12.5 mL/hr over 240 Minutes Intravenous  Once 07/05/16 1656          Note: Portions of this report may have been transcribed using voice recognition software. Every effort was made to ensure accuracy; however, inadvertent computerized transcription errors may be present.   Any  transcriptional errors that result from this process are unintentional.    Adin Hector, M.D., F.A.C.S. Gastrointestinal and Minimally Invasive Surgery [facility] [address], [place], [place] [address] [phone] Main / Paging   07/05/2016

## 2016-07-05 NOTE — ED Provider Notes (Signed)
4:30 PM Pt is seen and examined by me as well. Pt is here with dizziness, onset several hours ago. Pt is status post gastric bypass 4 days ago. Bariatric nurse at bedside, states pt has had some dizziness and SOB while hospitalized and had to stay for an extra day due to those symptoms. Pt had CT angio chest done which was unremarkable. Pt states she otherwise is feeling well. She is on liquid diet and is tolerating it well. She states her abdominal pain is improving. She had 1 dose of oxycodone this morning and 1 dose of zofran. Otherwise no other medications. Pt had a bowel movement yeterday, none today. Pt's BP was "soft" according to the barriatric nurse, who said it droped to 80s but came up after bolus of IV fluids.   On exam, abdomen is diffusely tender, incisions appear to be healing well with no signs of infection. Pt is afebrile. She is hypotensive. Will give IV fluids, check labs, monitor.   6:21 PM Spoke with Dr. Johney Maine, will admit. Asked for medicine consult. Pt is to get 2L of IV fluids. She is on her 2nd L at this time. BP remains 18D systolic. Will page medicine. Dr. Johney Maine does not believe CT necessary unless pt having increased abdominal pain, which patient does not.   6:41 PM Spoke with Dr. Adela Ports, will consult.    Jeannett Senior, PA-C 07/05/16 2354    Duffy Bruce, MD 07/06/16 1315

## 2016-07-05 NOTE — ED Notes (Addendum)
[place], Bariatric Coordinator pager number [phone]

## 2016-07-06 ENCOUNTER — Inpatient Hospital Stay (HOSPITAL_COMMUNITY): Payer: 59

## 2016-07-06 LAB — BASIC METABOLIC PANEL
Anion gap: 8 (ref 5–15)
BUN: 18 mg/dL (ref 6–20)
CALCIUM: 8.7 mg/dL — AB (ref 8.9–10.3)
CO2: 26 mmol/L (ref 22–32)
Chloride: 103 mmol/L (ref 101–111)
Creatinine, Ser: 0.89 mg/dL (ref 0.44–1.00)
GFR calc Af Amer: >60 mL/min (ref >60)
Glucose, Bld: 84 mg/dL (ref 65–99)
POTASSIUM: 3.4 mmol/L — AB (ref 3.5–5.1)
SODIUM: 137 mmol/L (ref 135–145)

## 2016-07-06 LAB — CBC
HEMATOCRIT: 33.5 % — AB (ref 36.0–46.0)
Hemoglobin: 10.8 g/dL — ABNORMAL LOW (ref 12.0–15.0)
MCH: 25.9 pg — ABNORMAL LOW (ref 26.0–34.0)
MCHC: 32.2 g/dL (ref 30.0–36.0)
MCV: 80.3 fL (ref 78.0–100.0)
Platelets: 221 10*3/uL (ref 150–400)
RBC: 4.17 MIL/uL (ref 3.87–5.11)
RDW: 15.5 % (ref 11.5–15.5)
WBC: 9.2 10*3/uL (ref 4.0–10.5)

## 2016-07-06 LAB — BRAIN NATRIURETIC PEPTIDE: B NATRIURETIC PEPTIDE 5: 108 pg/mL — AB (ref 0.0–100.0)

## 2016-07-06 LAB — HIV ANTIBODY (ROUTINE TESTING W REFLEX): HIV Screen 4th Generation wRfx: NONREACTIVE

## 2016-07-06 MED ORDER — ACETAMINOPHEN 650 MG RE SUPP: 650.0000 mg | Freq: Four times a day (QID) | RECTAL | Status: DC | PRN

## 2016-07-06 MED ORDER — ACETAMINOPHEN 160 MG/5ML PO SOLN: 650.0000 mg | Freq: Four times a day (QID) | ORAL | Status: DC | PRN

## 2016-07-06 MED ORDER — ACETAMINOPHEN 160 MG/5ML PO SOLN
650.0000 mg | Freq: Four times a day (QID) | ORAL | Status: DC | PRN
  Administered 2016-07-06 – 2016-07-07 (×3): 650 mg via ORAL
  Filled 2016-07-06 (×3): qty 20.3

## 2016-07-06 NOTE — Progress Notes (Signed)
  Subjective: Feeling better today Dizziness much less Denies SOB or abd pain  Objective: Vital signs in last 24 hours: Temp:  [97.9 F (36.6 C)-98.2 F (36.8 C)] 98.1 F (36.7 C) (03/17 0558) Pulse Rate:  [56-67] 57 (03/17 0558) Resp:  [15-20] 16 (03/17 0558) BP: (75-95)/(36-56) 94/54 (03/17 0558) SpO2:  [87 %-97 %] 93 % (03/17 0558) Weight:  [142 kg (313 lb 0.9 oz)] 142 kg (313 lb 0.9 oz) (03/16 2100) Last BM Date: 07/04/16  Intake/Output from previous day: 03/16 0701 - 03/17 0700 In: 1100 [I.V.:1100] Out: 1100 [Urine:1100] Intake/Output this shift: No intake/output data recorded.  Exam: Awake and alert, appears comfortable Lungs clear CV RRR Abdomen soft, NT  Lab Results:   Recent Labs  07/05/16 1640 07/06/16 0513  WBC 10.9* 9.2  HGB 12.2 10.8*  HCT 37.8 33.5*  PLT 235 221   BMET  Recent Labs  07/05/16 1640 07/06/16 0513  NA 140 137  K 3.6 3.4*  CL 105 103  CO2 30 26  GLUCOSE 115* 84  BUN 22* 18  CREATININE 1.03* 0.89  CALCIUM 9.0 8.7*   PT/INR No results for input(s): LABPROT, INR in the last 72 hours. ABG No results for input(s): PHART, HCO3 in the last 72 hours.  Invalid input(s): PCO2, PO2  Studies/Results: Ct Angio Chest Pe W Or Wo Contrast  Result Date: 07/04/2016 CLINICAL DATA:  Shortness of Breath EXAM: CT ANGIOGRAPHY CHEST WITH CONTRAST TECHNIQUE: Multidetector CT imaging of the chest was performed using the standard protocol during bolus administration of intravenous contrast. Multiplanar CT image reconstructions and MIPs were obtained to evaluate the vascular anatomy. CONTRAST:  100 mL Isovue 370. COMPARISON:  None. FINDINGS: Cardiovascular: Thoracic aorta shows no aneurysmal dilatation or dissection. The pulmonary artery demonstrates a normal branching pattern without evidence of intraluminal filling defect to suggest pulmonary embolism. Mediastinum/Nodes: No significant hilar or mediastinal adenopathy is noted. The thoracic inlet is  within normal limits. Lungs/Pleura: Small left pleural effusion is noted as well as mild bibasilar atelectatic changes. No focal nodules or pneumothorax are seen. Upper Abdomen: Within normal limits. Musculoskeletal: No acute abnormality noted. Review of the MIP images confirms the above findings. IMPRESSION: No evidence of pulmonary emboli. Small left pleural effusion and bibasilar atelectatic changes. Electronically Signed   By: Inez Catalina M.D.   On: 07/04/2016 10:59    Anti-infectives: Anti-infectives    Start     Dose/Rate Route Frequency Ordered Stop   07/05/16 1700  piperacillin-tazobactam (ZOSYN) IVPB 3.375 g     3.375 g 12.5 mL/hr over 240 Minutes Intravenous  Once 07/05/16 1656 07/05/16 2206      Assessment/Plan:  Postop dizziness  Labs improved.  I suspect she had some dehydration.  She's feeling better after fluids.  Will keep until tomorrow  Jennifer Mayo A 07/06/2016

## 2016-07-07 ENCOUNTER — Inpatient Hospital Stay (HOSPITAL_COMMUNITY): Payer: 59

## 2016-07-07 DIAGNOSIS — R9431 Abnormal electrocardiogram [ECG] [EKG]: Secondary | ICD-10-CM

## 2016-07-07 LAB — ECHOCARDIOGRAM COMPLETE
Height: 66 in
Weight: 4973.58 oz

## 2016-07-07 MED ORDER — SODIUM CHLORIDE 0.9 % IV SOLN: 250.0000 mL | INTRAVENOUS | Status: DC | PRN

## 2016-07-07 MED ORDER — LACTATED RINGERS IV BOLUS (SEPSIS): 1000.0000 mL | Freq: Three times a day (TID) | INTRAVENOUS | Status: DC | PRN

## 2016-07-07 MED ORDER — PERFLUTREN LIPID MICROSPHERE
1.0000 mL | INTRAVENOUS | Status: AC | PRN
  Administered 2016-07-07: 2 mL via INTRAVENOUS
  Filled 2016-07-07: qty 10

## 2016-07-07 MED ORDER — SODIUM CHLORIDE 0.9% FLUSH
3.0000 mL | Freq: Two times a day (BID) | INTRAVENOUS | Status: DC
  Administered 2016-07-07 – 2016-07-08 (×3): 3 mL via INTRAVENOUS

## 2016-07-07 MED ORDER — SODIUM CHLORIDE 0.9% FLUSH: 3.0000 mL | INTRAVENOUS | Status: DC | PRN

## 2016-07-07 NOTE — Progress Notes (Signed)
Echocardiogram 2D Echocardiogram with contrast has been performed.  Joelene Millin 07/07/2016, 9:25 AM

## 2016-07-07 NOTE — Progress Notes (Signed)
[facility]  [address] [place], [place] [address] Phone: [phone] FAX: [phone]   Greg Eckrich [id number] [birth year]    Problem List:   Principal Problem:   Hypotension Active Problems:   Narcolepsy   Vitamin B 12 deficiency   Mild diastolic dysfunction   Obstructive sleep apnea   Morbid obesity due to excess calories (HCC)           Assessment  Improving  Plan:  -Advance diet. -Stop IV fluids and follow. -med help appreciated -f/u ECHO done this AM.  BNP WNL encouraging -f/u lytes -CPAP for OSA narcolepsy Tx -VTE prophylaxis- SCDs, etc -mobilize as tolerated to help recovery -If patient struggles off IV fluids, may benefit from outpatient infusions for the next few weeks until feeling better.  We will defer to bariatric surgeon Dr. Kieth Brightly tomorrow  D/C patient from hospital when patient meets criteria (anticipate in 1-2 day(s)):  Tolerating oral intake well No orthostasis Ambulating well Adequate pain control without IV medications Urinating  Having flatus Disposition planning in place   Adin Hector, M.D., F.A.C.S. Gastrointestinal and Minimally Invasive Surgery [facility] [address], [place], [place] [address] [phone] Main / Paging   07/07/2016  CARE TEAM:  PCP: Bonnita Nasuti, MD  Outpatient Care Team: Patient Care Team: Raelyn Number, MD as PCP - General (Internal Medicine)  Inpatient Treatment Team: Treatment Team: Attending Provider: Mickeal Skinner, MD; Technician: Abbe Amsterdam, NT; Technician: Etheleen Sia, NT  Subjective:  Feeling better.  Trying more advanced diet.  Walking a little better.  Objective:  Vital signs:  Vitals:   07/06/16 2135 07/07/16 0231 07/07/16 0509 07/07/16 1001  BP: 115/63 (!) 90/53 (!) 95/56 122/61  Pulse: 64 64 64 (!) 58  Resp: 18 15 16 18   Temp: 98.3 F (36.8 C) 97.7 F (36.5  C) 97.7 F (36.5 C) 98.3 F (36.8 C)  TempSrc: Oral Oral Oral Oral  SpO2: 94% 94% 95% 95%  Weight:   (!) 141 kg (310 lb 13.6 oz)   Height:        Last BM Date: 07/04/16  Intake/Output   Yesterday:  03/17 0701 - 03/18 0700 In: 2718.3 [P.O.:360; I.V.:2158.3; IV Piggyback:200] Out: 2900 [Urine:2900] This shift:  Total I/O In: 120 [P.O.:120] Out: 600 [Urine:600]  Bowel function:  Flatus: YES  BM:  YES  Drain: (No drain)   Physical Exam:  General: Pt awake/alert/oriented x4 in No acute distress Eyes: PERRL, normal EOM.  Sclera clear.  No icterus Neuro: CN II-XII intact w/o focal sensory/motor deficits. Lymph: No head/neck/groin lymphadenopathy Psych:  No delerium/psychosis/paranoia HENT: Normocephalic, Mucus membranes moist.  No thrush Neck: Supple, No tracheal deviation Chest: No chest wall pain w good excursion CV:  Pulses intact.  Regular rhythm MS: Normal AROM mjr joints.  No obvious deformity Abdomen: Soft.  Nondistended.  Nontender.  No evidence of peritonitis.  No incarcerated hernias. Ext:  SCDs BLE.  No mjr edema.  No cyanosis Skin: No petechiae / purpura  Results:   Labs: Results for orders placed or performed during the hospital encounter of 07/05/16 (from the past 48 hour(s))  CBC     Status: Abnormal   Collection Time: 07/05/16  4:40 PM  Result Value Ref Range   WBC 10.9 (H) 4.0 - 10.5 K/uL   RBC 4.61 3.87 - 5.11 MIL/uL   Hemoglobin 12.2 12.0 - 15.0 g/dL   HCT 37.8 36.0 -  46.0 %   MCV 82.0 78.0 - 100.0 fL   MCH 26.5 26.0 - 34.0 pg   MCHC 32.3 30.0 - 36.0 g/dL   RDW 15.3 11.5 - 15.5 %   Platelets 235 150 - 400 K/uL  Comprehensive metabolic panel     Status: Abnormal   Collection Time: 07/05/16  4:40 PM  Result Value Ref Range   Sodium 140 135 - 145 mmol/L   Potassium 3.6 3.5 - 5.1 mmol/L   Chloride 105 101 - 111 mmol/L   CO2 30 22 - 32 mmol/L   Glucose, Bld 115 (H) 65 - 99 mg/dL   BUN 22 (H) 6 - 20 mg/dL   Creatinine, Ser 1.03 (H) 0.44 -  1.00 mg/dL   Calcium 9.0 8.9 - 10.3 mg/dL   Total Protein 6.3 (L) 6.5 - 8.1 g/dL   Albumin 3.2 (L) 3.5 - 5.0 g/dL   AST 32 15 - 41 U/L   ALT 84 (H) 14 - 54 U/L   Alkaline Phosphatase 89 38 - 126 U/L   Total Bilirubin 0.5 0.3 - 1.2 mg/dL   GFR calc non Af Amer >60 >60 mL/min   GFR calc Af Amer >60 >60 mL/min    Comment: (NOTE) The eGFR has been calculated using the CKD EPI equation. This calculation has not been validated in all clinical situations. eGFR's persistently <60 mL/min signify possible Chronic Kidney Disease.    Anion gap 5 5 - 15  Lipase, blood     Status: None   Collection Time: 07/05/16  4:40 PM  Result Value Ref Range   Lipase 21 11 - 51 U/L  Brain natriuretic peptide     Status: None   Collection Time: 07/05/16  4:40 PM  Result Value Ref Range   B Natriuretic Peptide 61.9 0.0 - 100.0 pg/mL  I-Stat CG4 Lactic Acid, ED     Status: None   Collection Time: 07/05/16  4:50 PM  Result Value Ref Range   Lactic Acid, Venous 1.19 0.5 - 1.9 mmol/L  Blood culture (routine x 2)     Status: None (Preliminary result)   Collection Time: 07/05/16  5:24 PM  Result Value Ref Range   Specimen Description BLOOD LEFT HAND    Special Requests IN PEDIATRIC BOTTLE 3 CC    Culture      NO GROWTH < 24 HOURS Performed at [facility], [place] [address] [place], [place] [address]    Report Status PENDING   TSH     Status: None   Collection Time: 07/05/16  5:24 PM  Result Value Ref Range   TSH 2.631 0.350 - 4.500 uIU/mL    Comment: Performed by a 3rd Generation assay with a functional sensitivity of <=0.01 uIU/mL.  Cortisol     Status: None   Collection Time: 07/05/16  5:24 PM  Result Value Ref Range   Cortisol, Plasma 17.1 ug/dL    Comment: (NOTE) AM    6.7 - 22.6 ug/dL PM   <10.0       ug/dL Performed at [facility] [address] [place], [place] [address]   Blood culture (routine x 2)     Status: None (Preliminary result)   Collection Time: 07/05/16  6:02 PM   Result Value Ref Range   Specimen Description BLOOD RIGHT FOREARM    Special Requests IN PEDIATRIC BOTTLE 2.5 ML    Culture      NO GROWTH < 24 HOURS Performed at [facility], [place] [address], [place], [place] [address]    Report Status PENDING   Urinalysis, Routine w reflex microscopic     Status: Abnormal   Collection Time: 07/05/16  6:19 PM  Result Value Ref Range   Color, Urine AMBER (A) YELLOW    Comment: BIOCHEMICALS MAY BE AFFECTED BY COLOR   APPearance HAZY (A) CLEAR   Specific Gravity, Urine 1.027 1.005 - 1.030   pH 5.0 5.0 - 8.0   Glucose, UA NEGATIVE NEGATIVE mg/dL   Hgb urine dipstick NEGATIVE NEGATIVE   Bilirubin Urine NEGATIVE NEGATIVE   Ketones, ur 5 (A) NEGATIVE mg/dL   Protein, ur 30 (A) NEGATIVE mg/dL   Nitrite NEGATIVE NEGATIVE   Leukocytes, UA NEGATIVE NEGATIVE   RBC / HPF 0-5 0 - 5 RBC/hpf   WBC, UA 6-30 0 - 5 WBC/hpf   Bacteria, UA RARE (A) NONE SEEN   Squamous Epithelial / LPF 0-5 (A) NONE SEEN   Mucous PRESENT    Hyaline Casts, UA PRESENT   Basic metabolic panel     Status: Abnormal   Collection Time: 07/06/16  5:13 AM  Result Value Ref Range   Sodium 137 135 - 145 mmol/L   Potassium 3.4 (L) 3.5 - 5.1 mmol/L   Chloride 103 101 - 111 mmol/L   CO2 26 22 - 32 mmol/L   Glucose, Bld 84 65 - 99 mg/dL   BUN 18 6 - 20 mg/dL   Creatinine, Ser 0.89 0.44 - 1.00 mg/dL   Calcium 8.7 (L) 8.9 - 10.3 mg/dL   GFR calc non Af Amer >60 >60 mL/min   GFR calc Af Amer >60 >60 mL/min    Comment: (NOTE) The eGFR has been calculated using the CKD EPI equation. This calculation has not been validated in all clinical situations. eGFR's persistently <60 mL/min signify possible Chronic Kidney Disease.    Anion gap 8 5 - 15  CBC     Status: Abnormal   Collection Time: 07/06/16  5:13 AM  Result Value Ref Range   WBC 9.2 4.0 - 10.5 K/uL   RBC 4.17 3.87 - 5.11 MIL/uL   Hemoglobin 10.8 (L) 12.0 - 15.0 g/dL   HCT 33.5 (L) 36.0 - 46.0 %   MCV 80.3 78.0 - 100.0 fL    MCH 25.9 (L) 26.0 - 34.0 pg   MCHC 32.2 30.0 - 36.0 g/dL   RDW 15.5 11.5 - 15.5 %   Platelets 221 150 - 400 K/uL  HIV antibody (Routine Testing)     Status: None   Collection Time: 07/06/16  5:13 AM  Result Value Ref Range   HIV Screen 4th Generation wRfx Non Reactive Non Reactive    Comment: (NOTE) Performed At: [facility] [place], [place] [id number] Lindon Romp MD IO:[id number]   Brain natriuretic peptide     Status: Abnormal   Collection Time: 07/06/16  5:13 AM  Result Value Ref Range   B Natriuretic Peptide 108.0 (H) 0.0 - 100.0 pg/mL    Imaging / Studies: Dg Chest 2 View  Result Date: 07/06/2016 CLINICAL DATA:  Syncopal episode 2 days ago. EXAM: CHEST  2 VIEW COMPARISON:  02/13/2016.  Chest CTA dated 07/04/2016. FINDINGS: Borderline enlarged cardiac silhouette. Mild diffuse peribronchial thickening without significant change since 02/13/2016. Small amount of patchy density at the posterior lung bases on the lateral view. Corresponding pleural fluid and atelectasis on the recent CT. Unremarkable bones. IMPRESSION: 1. Pleural fluid and atelectasis at the  posterior lung bases on the lateral view. 2. Borderline cardiomegaly. 3. Stable mild chronic bronchitic changes. Electronically Signed   By: Claudie Revering M.D.   On: 07/06/2016 10:17    Medications / Allergies: per chart  Antibiotics: Anti-infectives    Start     Dose/Rate Route Frequency Ordered Stop   07/05/16 1700  piperacillin-tazobactam (ZOSYN) IVPB 3.375 g     3.375 g 12.5 mL/hr over 240 Minutes Intravenous  Once 07/05/16 1656 07/05/16 2206        Note: Portions of this report may have been transcribed using voice recognition software. Every effort was made to ensure accuracy; however, inadvertent computerized transcription errors may be present.   Any transcriptional errors that result from this process are unintentional.     Adin Hector, M.D., F.A.C.S. Gastrointestinal and  Minimally Invasive Surgery [facility] [address], [place], [place] [address] [phone] Main / Paging   07/07/2016

## 2016-07-08 LAB — CBC
HEMATOCRIT: 34.5 % — AB (ref 36.0–46.0)
Hemoglobin: 11.2 g/dL — ABNORMAL LOW (ref 12.0–15.0)
MCH: 26.5 pg (ref 26.0–34.0)
MCHC: 32.5 g/dL (ref 30.0–36.0)
MCV: 81.8 fL (ref 78.0–100.0)
Platelets: 244 10*3/uL (ref 150–400)
RBC: 4.22 MIL/uL (ref 3.87–5.11)
RDW: 15.7 % — ABNORMAL HIGH (ref 11.5–15.5)
WBC: 8.5 10*3/uL (ref 4.0–10.5)

## 2016-07-08 LAB — BASIC METABOLIC PANEL
ANION GAP: 9 (ref 5–15)
BUN: 17 mg/dL (ref 6–20)
CHLORIDE: 107 mmol/L (ref 101–111)
CO2: 26 mmol/L (ref 22–32)
Calcium: 9 mg/dL (ref 8.9–10.3)
Creatinine, Ser: 0.75 mg/dL (ref 0.44–1.00)
GFR calc Af Amer: >60 mL/min (ref >60)
GLUCOSE: 85 mg/dL (ref 65–99)
Potassium: 3.8 mmol/L (ref 3.5–5.1)
SODIUM: 142 mmol/L (ref 135–145)

## 2016-07-08 LAB — MAGNESIUM: Magnesium: 2.2 mg/dL (ref 1.7–2.4)

## 2016-07-08 NOTE — Discharge Summary (Signed)
Physician Discharge Summary  Patient ID: Jennifer Mayo MRN: [id number] DOB/AGE: Jun 04, 1975 41 y.o.  Admit date: 07/05/2016 Discharge date: 07/08/2016  Admission Diagnoses: Dehydration  Discharge Diagnoses:  Principal Problem:   Hypotension Active Problems:   Narcolepsy   Vitamin B 12 deficiency   Mild diastolic dysfunction   Obstructive sleep apnea   Morbid obesity due to excess calories [facility])   Discharged Condition: good  Hospital Course: Readmitted s/p RYGb on 3/12. Presented the day after discharge with dizziness and lightheadedness (on 3/16), which had complicated her immediate post-op course as well. Also found to be mildly hypotensive but no tachycardia or peritonitis. CTPE had been performed and negative prior to initial discharge. Tolerating PO without nausea or emesis.   -She was admitted and resuscitated with IVF. CXR, labs and echo below. Medicine was consulted and agreed her low BP and dizziness likely secondary to hypovolemia. Her symptoms improved with IVF.  -On the day of discharge she had been off IVF for 24h. Taking a good amount of PO with good urine output. Feels significantly better and feels ready for discharge.   Consults: medicine  Significant Diagnostic Studies:  Echo: - Left ventricle: The cavity size was normal. Wall thickness was   normal. Systolic function was normal. The estimated ejection   fraction was in the range of 60% to 65%. Wall motion was normal;   there were no regional wall motion abnormalities. Left   ventricular diastolic function parameters were normal.  CXR: FINDINGS: Borderline enlarged cardiac silhouette. Mild diffuse peribronchial thickening without significant change since 02/13/2016. Small amount of patchy density at the posterior lung bases on the lateral view. Corresponding pleural fluid and atelectasis on the recent CT. Unremarkable bones.  IMPRESSION: 1. Pleural fluid and atelectasis at the posterior lung bases on  the lateral view. 2. Borderline cardiomegaly. 3. Stable mild chronic bronchitic changes.  Labs: see EPIC chart. Within normal limits day of discharge. Blood cultures NGTD  Treatments: IV hydration  Discharge Exam: Blood pressure 113/65, pulse (!) 59, temperature 98 F (36.7 C), temperature source Oral, resp. rate 18, height 5\' 6"  (1.676 m), weight (!) 138.2 kg (304 lb 11.2 oz), SpO2 98 %. General appearance: alert and cooperative Resp: unlabored respirations GI: soft, non-tender; bowel sounds normal; no masses,  no organomegaly and steris in place Incision/Wound:c/d/I with steris  Disposition: 01-Home or Self Care  Discharge Instructions    Discharge patient    Complete by:  As directed    Discharge disposition:  01-Home or Self Care   Discharge patient date:  07/08/2016     Allergies as of 07/08/2016      Reactions   Ranexa [ranolazine] Shortness Of Breath   Nsaids    Gastric bypass - ulcer risk   Verapamil Other (See Comments)   hypotension      Medication List    TAKE these medications   CALCIUM 1000 + D PO Take 1 tablet by mouth 3 (three) times daily.   modafinil 200 MG tablet Commonly known as:  PROVIGIL Take 200 mg by mouth daily.   ondansetron 4 MG disintegrating tablet Commonly known as:  ZOFRAN-ODT Take 4 mg by mouth every 6 (six) hours as needed.   oxyCODONE 5 MG/5ML solution Commonly known as:  ROXICODONE Take 5-10 mLs by mouth every 4 (four) hours as needed for pain.   pantoprazole 40 MG tablet Commonly known as:  PROTONIX Take 40 mg by mouth daily.   PRESCRIPTION MEDICATION Take 1 tablet by mouth 2 (two)  times daily. BARIATRIC ADVANTAGE VITAMIN   spironolactone 50 MG tablet Commonly known as:  ALDACTONE Take 50 mg by mouth daily.   TURMERIC PO Take 1 tablet by mouth daily.   VIIBRYD 20 MG Tabs Generic drug:  Vilazodone HCl Take 20 mg by mouth daily.   vitamin B-12 1000 MCG tablet Commonly known as:  CYANOCOBALAMIN Take 1,000 mcg by  mouth daily.        Signed: Clovis Riley 07/08/2016, 9:10 AM

## 2016-07-09 ENCOUNTER — Encounter: Payer: Self-pay | Admitting: Internal Medicine

## 2016-07-10 LAB — CULTURE, BLOOD (ROUTINE X 2)
CULTURE: NO GROWTH
Culture: NO GROWTH

## 2016-07-11 ENCOUNTER — Telehealth (HOSPITAL_COMMUNITY): Payer: Self-pay

## 2016-07-11 NOTE — Telephone Encounter (Signed)
Made discharge phone call to patient.. Asking the following questions.    1. Do you have someone to care for you now that you are home?  Independent no more dizziness 2. Are you having pain now that is not relieved by your pain medication?  Does ntot need pain medication 3. Are you able to drink the recommended daily amount of fluids (48 ounces minimum/day) and protein (60-80 grams/day) as prescribed by the dietitian or nutritional counselor?  Patient is taking in 64 ounces of fluid and 60 grams of protein 4. Are you taking the vitamins and minerals as prescribed?  No problems with vitamins or minerals 5. Do you have the "on call" number to contact your surgeon if you have a problem or question?  yes 6. Are your incisions free of redness, swelling or drainage? (If steri strips, address that these can fall off, shower as tolerated) look good most of the steri strips have fallen off 7. Have your bowels moved since your surgery?  If not, are you passing gas?  Yes has had a bm 8. Are you up and walking 3-4 times per day?  yes 9. Were you provided your discharge medications before your surgery or before you were discharged from the hospital and are you taking them without problem?  No problems with any medications and patient had meds and vitamins at discharge

## 2016-07-16 ENCOUNTER — Encounter: Payer: 59 | Attending: General Surgery | Admitting: Skilled Nursing Facility1

## 2016-07-16 DIAGNOSIS — Z713 Dietary counseling and surveillance: Secondary | ICD-10-CM | POA: Insufficient documentation

## 2016-07-18 ENCOUNTER — Encounter: Payer: Self-pay | Admitting: Skilled Nursing Facility1

## 2016-07-18 NOTE — Progress Notes (Signed)
Bariatric Class:  Appt start time: 1530 end time:  1630.  2 Week Post-Operative Nutrition Class  Patient was seen on 07/18/2016 for Post-[facility]ative Nutrition education at [facility].   Surgery date:  Surgery type:  [facility] Weight today: 292.4 Weight change: 21.6  TANITA  BODY COMP RESULTS  07/18/2016   BMI (kg/m^2) 47.2   Fat Mass (lbs) 159   Fat Free Mass (lbs) 159   Total Body Water (lbs) 99.6   The following the learning objectives were met by the patient during this course:  Identifies Phase 3A (Soft, High Proteins) Dietary Goals and will begin from 2 weeks post-operatively to 2 months post-operatively  Identifies appropriate sources of fluids and proteins   States protein recommendations and appropriate sources post-operatively  Identifies the need for appropriate texture modifications, mastication, and bite sizes when consuming solids  Identifies appropriate multivitamin and calcium sources post-operatively  Describes the need for physical activity post-operatively and will follow MD recommendations  States when to call healthcare provider regarding medication questions or post-operative complications  Handouts given during class include:  Phase 3A: Soft, High Protein Diet Handout  Follow-Up Plan: Patient will follow-up at [facility] in 6 weeks for 2 month post-op nutrition visit for diet advancement per MD.

## 2016-10-19 ENCOUNTER — Encounter (HOSPITAL_COMMUNITY): Payer: Self-pay | Admitting: Emergency Medicine

## 2016-10-19 ENCOUNTER — Emergency Department (HOSPITAL_COMMUNITY): Payer: 59

## 2016-10-19 ENCOUNTER — Inpatient Hospital Stay (HOSPITAL_COMMUNITY)
Admission: EM | Admit: 2016-10-19 | Discharge: 2016-10-24 | DRG: 314 | Disposition: A | Discharge status: Home / Self Care | Payer: 59 | Attending: Internal Medicine | Admitting: Internal Medicine

## 2016-10-19 DIAGNOSIS — Z90710 Acquired absence of both cervix and uterus: Secondary | ICD-10-CM | POA: Diagnosis not present

## 2016-10-19 DIAGNOSIS — Z9884 Bariatric surgery status: Secondary | ICD-10-CM | POA: Diagnosis not present

## 2016-10-19 DIAGNOSIS — I951 Orthostatic hypotension: Secondary | ICD-10-CM | POA: Diagnosis not present

## 2016-10-19 DIAGNOSIS — R55 Syncope and collapse: Secondary | ICD-10-CM | POA: Diagnosis not present

## 2016-10-19 DIAGNOSIS — E861 Hypovolemia: Secondary | ICD-10-CM | POA: Diagnosis present

## 2016-10-19 DIAGNOSIS — I950 Idiopathic hypotension: Principal | ICD-10-CM

## 2016-10-19 DIAGNOSIS — I509 Heart failure, unspecified: Secondary | ICD-10-CM | POA: Diagnosis present

## 2016-10-19 DIAGNOSIS — I959 Hypotension, unspecified: Secondary | ICD-10-CM | POA: Diagnosis present

## 2016-10-19 DIAGNOSIS — J9600 Acute respiratory failure, unspecified whether with hypoxia or hypercapnia: Secondary | ICD-10-CM | POA: Diagnosis present

## 2016-10-19 DIAGNOSIS — N179 Acute kidney failure, unspecified: Secondary | ICD-10-CM | POA: Diagnosis present

## 2016-10-19 DIAGNOSIS — J9811 Atelectasis: Secondary | ICD-10-CM | POA: Diagnosis not present

## 2016-10-19 DIAGNOSIS — E876 Hypokalemia: Secondary | ICD-10-CM | POA: Diagnosis present

## 2016-10-19 DIAGNOSIS — I110 Hypertensive heart disease with heart failure: Secondary | ICD-10-CM | POA: Diagnosis present

## 2016-10-19 DIAGNOSIS — E871 Hypo-osmolality and hyponatremia: Secondary | ICD-10-CM | POA: Diagnosis present

## 2016-10-19 DIAGNOSIS — E2740 Unspecified adrenocortical insufficiency: Secondary | ICD-10-CM | POA: Diagnosis present

## 2016-10-19 DIAGNOSIS — Z452 Encounter for adjustment and management of vascular access device: Secondary | ICD-10-CM | POA: Diagnosis not present

## 2016-10-19 DIAGNOSIS — E538 Deficiency of other specified B group vitamins: Secondary | ICD-10-CM

## 2016-10-19 DIAGNOSIS — G4733 Obstructive sleep apnea (adult) (pediatric): Secondary | ICD-10-CM | POA: Diagnosis present

## 2016-10-19 DIAGNOSIS — Z8542 Personal history of malignant neoplasm of other parts of uterus: Secondary | ICD-10-CM | POA: Diagnosis not present

## 2016-10-19 DIAGNOSIS — R0602 Shortness of breath: Secondary | ICD-10-CM | POA: Diagnosis not present

## 2016-10-19 DIAGNOSIS — I340 Nonrheumatic mitral (valve) insufficiency: Secondary | ICD-10-CM | POA: Diagnosis not present

## 2016-10-19 DIAGNOSIS — R0989 Other specified symptoms and signs involving the circulatory and respiratory systems: Secondary | ICD-10-CM | POA: Diagnosis not present

## 2016-10-19 DIAGNOSIS — J811 Chronic pulmonary edema: Secondary | ICD-10-CM

## 2016-10-19 LAB — I-STAT BETA HCG BLOOD, ED (MC, WL, AP ONLY): I-stat hCG, quantitative: <5 m[IU]/mL (ref <5)

## 2016-10-19 LAB — PROCALCITONIN: Procalcitonin: <0.1 ng/mL

## 2016-10-19 LAB — TROPONIN I
Troponin I: <0.03 ng/mL (ref <0.03)
Troponin I: <0.03 ng/mL (ref <0.03)

## 2016-10-19 LAB — URINALYSIS, ROUTINE W REFLEX MICROSCOPIC
Bilirubin Urine: NEGATIVE
Glucose, UA: NEGATIVE mg/dL
Hgb urine dipstick: NEGATIVE
KETONES UR: NEGATIVE mg/dL
Leukocytes, UA: NEGATIVE
Nitrite: NEGATIVE
PROTEIN: 100 mg/dL — AB
SPECIFIC GRAVITY, URINE: 1.019 (ref 1.005–1.030)
pH: 5 (ref 5.0–8.0)

## 2016-10-19 LAB — I-STAT TROPONIN, ED: Troponin i, poc: 0 ng/mL (ref 0.00–0.08)

## 2016-10-19 LAB — T4, FREE: FREE T4: 1.09 ng/dL (ref 0.61–1.12)

## 2016-10-19 LAB — GLUCOSE, CAPILLARY
GLUCOSE-CAPILLARY: 100 mg/dL — AB (ref 65–99)
GLUCOSE-CAPILLARY: 196 mg/dL — AB (ref 65–99)
GLUCOSE-CAPILLARY: 211 mg/dL — AB (ref 65–99)
Glucose-Capillary: 120 mg/dL — ABNORMAL HIGH (ref 65–99)

## 2016-10-19 LAB — CREATININE, SERUM
CREATININE: 1.13 mg/dL — AB (ref 0.44–1.00)
GFR calc Af Amer: >60 mL/min (ref >60)
GFR, EST NON AFRICAN AMERICAN: 60 mL/min — AB (ref >60)

## 2016-10-19 LAB — BASIC METABOLIC PANEL
Anion gap: 8 (ref 5–15)
BUN: 16 mg/dL (ref 6–20)
CALCIUM: 8.9 mg/dL (ref 8.9–10.3)
CO2: 25 mmol/L (ref 22–32)
CREATININE: 1.22 mg/dL — AB (ref 0.44–1.00)
Chloride: 104 mmol/L (ref 101–111)
GFR, EST NON AFRICAN AMERICAN: 54 mL/min — AB (ref >60)
Glucose, Bld: 120 mg/dL — ABNORMAL HIGH (ref 65–99)
Potassium: 3.9 mmol/L (ref 3.5–5.1)
SODIUM: 137 mmol/L (ref 135–145)

## 2016-10-19 LAB — CBC WITH DIFFERENTIAL/PLATELET
BASOS PCT: 1 %
Basophils Absolute: 0 10*3/uL (ref 0.0–0.1)
EOS ABS: 0.1 10*3/uL (ref 0.0–0.7)
Eosinophils Relative: 1 %
HCT: 41.6 % (ref 36.0–46.0)
HEMOGLOBIN: 13.1 g/dL (ref 12.0–15.0)
Lymphocytes Relative: 23 %
Lymphs Abs: 1.9 10*3/uL (ref 0.7–4.0)
MCH: 26.4 pg (ref 26.0–34.0)
MCHC: 31.5 g/dL (ref 30.0–36.0)
MCV: 83.9 fL (ref 78.0–100.0)
MONOS PCT: 8 %
Monocytes Absolute: 0.7 10*3/uL (ref 0.1–1.0)
NEUTROS PCT: 67 %
Neutro Abs: 5.8 10*3/uL (ref 1.7–7.7)
Platelets: 214 10*3/uL (ref 150–400)
RBC: 4.96 MIL/uL (ref 3.87–5.11)
RDW: 14.7 % (ref 11.5–15.5)
WBC: 8.6 10*3/uL (ref 4.0–10.5)

## 2016-10-19 LAB — CBC
HCT: 41 % (ref 36.0–46.0)
HEMOGLOBIN: 13 g/dL (ref 12.0–15.0)
MCH: 27.1 pg (ref 26.0–34.0)
MCHC: 31.7 g/dL (ref 30.0–36.0)
MCV: 85.4 fL (ref 78.0–100.0)
PLATELETS: 217 10*3/uL (ref 150–400)
RBC: 4.8 MIL/uL (ref 3.87–5.11)
RDW: 15 % (ref 11.5–15.5)
WBC: 8.2 10*3/uL (ref 4.0–10.5)

## 2016-10-19 LAB — I-STAT CG4 LACTIC ACID, ED: LACTIC ACID, VENOUS: 0.99 mmol/L (ref 0.5–1.9)

## 2016-10-19 LAB — LACTIC ACID, PLASMA
LACTIC ACID, VENOUS: 1.3 mmol/L (ref 0.5–1.9)
Lactic Acid, Venous: 1.5 mmol/L (ref 0.5–1.9)

## 2016-10-19 LAB — MRSA PCR SCREENING: MRSA BY PCR: NEGATIVE

## 2016-10-19 LAB — CORTISOL: CORTISOL PLASMA: 4.5 ug/dL

## 2016-10-19 LAB — BRAIN NATRIURETIC PEPTIDE: B Natriuretic Peptide: 85.1 pg/mL (ref 0.0–100.0)

## 2016-10-19 MED ORDER — DOCUSATE SODIUM 50 MG/5ML PO LIQD: 100.0000 mg | Freq: Two times a day (BID) | ORAL | Status: DC | PRN

## 2016-10-19 MED ORDER — MIDODRINE HCL 5 MG PO TABS
10.0000 mg | ORAL_TABLET | Freq: Three times a day (TID) | ORAL | Status: DC
  Administered 2016-10-19 – 2016-10-24 (×14): 10 mg via ORAL
  Filled 2016-10-19 (×14): qty 2

## 2016-10-19 MED ORDER — HEPARIN SODIUM (PORCINE) 5000 UNIT/ML IJ SOLN
5000.0000 [IU] | Freq: Three times a day (TID) | INTRAMUSCULAR | Status: DC
  Administered 2016-10-19 – 2016-10-24 (×15): 5000 [IU] via SUBCUTANEOUS
  Filled 2016-10-19 (×17): qty 1

## 2016-10-19 MED ORDER — SODIUM CHLORIDE 0.9 % IV BOLUS (SEPSIS)
1000.0000 mL | Freq: Once | INTRAVENOUS | Status: AC
  Administered 2016-10-19: 1000 mL via INTRAVENOUS

## 2016-10-19 MED ORDER — ACETAMINOPHEN 325 MG PO TABS
650.0000 mg | ORAL_TABLET | Freq: Four times a day (QID) | ORAL | Status: DC | PRN
  Administered 2016-10-20 – 2016-10-21 (×3): 650 mg via ORAL
  Filled 2016-10-19 (×3): qty 2

## 2016-10-19 MED ORDER — HYDROCORTISONE NA SUCCINATE PF 100 MG IJ SOLR
50.0000 mg | Freq: Four times a day (QID) | INTRAMUSCULAR | Status: DC
  Administered 2016-10-19 – 2016-10-23 (×16): 50 mg via INTRAVENOUS
  Filled 2016-10-19: qty 2
  Filled 2016-10-19 (×4): qty 1
  Filled 2016-10-19 (×2): qty 2
  Filled 2016-10-19 (×2): qty 1
  Filled 2016-10-19: qty 2
  Filled 2016-10-19 (×6): qty 1

## 2016-10-19 MED ORDER — ORAL CARE MOUTH RINSE
15.0000 mL | Freq: Two times a day (BID) | OROMUCOSAL | Status: DC
  Administered 2016-10-19 – 2016-10-23 (×5): 15 mL via OROMUCOSAL

## 2016-10-19 MED ORDER — ACETAMINOPHEN 325 MG PO TABS: 650.0000 mg | ORAL_TABLET | ORAL | Status: DC | PRN

## 2016-10-19 MED ORDER — LISDEXAMFETAMINE DIMESYLATE 30 MG PO CAPS
50.0000 mg | ORAL_CAPSULE | Freq: Every day | ORAL | Status: DC
  Filled 2016-10-19: qty 1

## 2016-10-19 MED ORDER — VASOPRESSIN 20 UNIT/ML IV SOLN
0.0300 [IU]/min | INTRAVENOUS | Status: DC
  Administered 2016-10-19 – 2016-10-20 (×2): 0.03 [IU]/min via INTRAVENOUS
  Filled 2016-10-19 (×3): qty 2

## 2016-10-19 MED ORDER — ALBUTEROL SULFATE (2.5 MG/3ML) 0.083% IN NEBU: 2.5000 mg | INHALATION_SOLUTION | RESPIRATORY_TRACT | Status: DC | PRN

## 2016-10-19 MED ORDER — ACETAMINOPHEN 160 MG/5ML PO SOLN
650.0000 mg | Freq: Four times a day (QID) | ORAL | Status: DC | PRN
  Administered 2016-10-19: 650 mg via ORAL
  Filled 2016-10-19: qty 20.3

## 2016-10-19 MED ORDER — SODIUM CHLORIDE 0.9 % IV SOLN: 250.0000 mL | INTRAVENOUS | Status: DC | PRN

## 2016-10-19 MED ORDER — FLUDROCORTISONE ACETATE 0.1 MG PO TABS
0.1000 mg | ORAL_TABLET | Freq: Every day | ORAL | Status: DC
  Administered 2016-10-20 – 2016-10-23 (×4): 0.1 mg via ORAL
  Filled 2016-10-19 (×5): qty 1

## 2016-10-19 MED ORDER — LISDEXAMFETAMINE DIMESYLATE 20 MG PO CAPS
50.0000 mg | ORAL_CAPSULE | Freq: Every day | ORAL | Status: DC
  Administered 2016-10-20 – 2016-10-24 (×5): 50 mg via ORAL
  Filled 2016-10-19 (×5): qty 1

## 2016-10-19 MED ORDER — ONDANSETRON HCL 4 MG/2ML IJ SOLN
4.0000 mg | Freq: Four times a day (QID) | INTRAMUSCULAR | Status: DC | PRN
  Administered 2016-10-19 – 2016-10-20 (×3): 4 mg via INTRAVENOUS
  Filled 2016-10-19 (×3): qty 2

## 2016-10-19 MED ORDER — NOREPINEPHRINE BITARTRATE 1 MG/ML IV SOLN
0.0000 ug/min | INTRAVENOUS | Status: DC
  Administered 2016-10-19: 25 ug/min via INTRAVENOUS
  Administered 2016-10-19: 2 ug/min via INTRAVENOUS
  Administered 2016-10-19 – 2016-10-20 (×8): 40 ug/min via INTRAVENOUS
  Administered 2016-10-20: 30 ug/min via INTRAVENOUS
  Administered 2016-10-20 (×2): 40 ug/min via INTRAVENOUS
  Filled 2016-10-19 (×15): qty 4

## 2016-10-19 MED ORDER — FAMOTIDINE IN NACL 20-0.9 MG/50ML-% IV SOLN
20.0000 mg | Freq: Two times a day (BID) | INTRAVENOUS | Status: DC
  Administered 2016-10-19 – 2016-10-21 (×5): 20 mg via INTRAVENOUS
  Filled 2016-10-19 (×7): qty 50

## 2016-10-19 MED ORDER — LISDEXAMFETAMINE DIMESYLATE 30 MG PO CAPS: 50.0000 mg | ORAL_CAPSULE | Freq: Every day | ORAL | Status: DC

## 2016-10-19 MED ORDER — INSULIN ASPART 100 UNIT/ML ~~LOC~~ SOLN
2.0000 [IU] | SUBCUTANEOUS | Status: DC
  Administered 2016-10-19: 4 [IU] via SUBCUTANEOUS
  Administered 2016-10-19 – 2016-10-20 (×2): 6 [IU] via SUBCUTANEOUS
  Administered 2016-10-20 (×2): 4 [IU] via SUBCUTANEOUS
  Administered 2016-10-20 (×2): 6 [IU] via SUBCUTANEOUS
  Administered 2016-10-21: 2 [IU] via SUBCUTANEOUS
  Administered 2016-10-21: 4 [IU] via SUBCUTANEOUS
  Administered 2016-10-21: 2 [IU] via SUBCUTANEOUS
  Administered 2016-10-21: 4 [IU] via SUBCUTANEOUS
  Administered 2016-10-22: 2 [IU] via SUBCUTANEOUS

## 2016-10-19 MED ORDER — ACETAMINOPHEN 160 MG/5ML PO SOLN: 650.0000 mg | ORAL | Status: DC | PRN

## 2016-10-19 MED ORDER — LACTATED RINGERS IV SOLN
INTRAVENOUS | Status: DC
  Administered 2016-10-19: 12:00:00 via INTRAVENOUS

## 2016-10-19 MED ORDER — CALCIUM CARBONATE-VITAMIN D 500-200 MG-UNIT PO TABS
1.0000 | ORAL_TABLET | Freq: Three times a day (TID) | ORAL | Status: DC
  Administered 2016-10-19 – 2016-10-24 (×15): 1 via ORAL
  Filled 2016-10-19 (×16): qty 1

## 2016-10-19 MED ORDER — COSYNTROPIN 0.25 MG IJ SOLR
0.2500 mg | Freq: Once | INTRAMUSCULAR | Status: AC
Start: 2016-10-20 — End: 2016-10-20
  Administered 2016-10-20: 0.25 mg via INTRAVENOUS
  Filled 2016-10-19: qty 0.25

## 2016-10-19 MED ORDER — B COMPLEX-C PO TABS
1.0000 | ORAL_TABLET | Freq: Every day | ORAL | Status: DC
  Administered 2016-10-19 – 2016-10-24 (×6): 1 via ORAL
  Filled 2016-10-19 (×6): qty 1

## 2016-10-19 NOTE — ED Notes (Signed)
RN attempted IV 2x unsuccessfully.  

## 2016-10-19 NOTE — Progress Notes (Signed)
eLink Physician-Brief Progress Note Patient Name: Jennifer Mayo DOB: 1975/11/06 MRN: [id number]   Date of Service  10/19/2016  HPI/Events of Note  Remains hypotensive  On max levo gtt Presumed adrenal insuff  eICU Interventions  Add vaso gtt May need CVL Add fludrocortisone     Intervention Category Major Interventions: Hypotension - evaluation and management  Stevey Stapleton V. 10/19/2016, 11:37 PM

## 2016-10-19 NOTE — ED Provider Notes (Signed)
[facility] DEPT Provider Note   CSN: [id number] Arrival date & time: 10/19/16  0547     History   Chief Complaint Chief Complaint  Patient presents with  . Near Syncope  . Dizziness    HPI Jennifer Mayo Overall is a 41 y.o. female.  The history is provided by the patient and the spouse.  Near Syncope  This is a new problem. The problem occurs constantly. The problem has not changed since onset.Associated symptoms include chest pain, headaches and shortness of breath. Pertinent negatives include no abdominal pain. The symptoms are aggravated by walking. The symptoms are relieved by rest.  Dizziness  Associated symptoms: chest pain, headaches and shortness of breath   Associated symptoms: no blood in stool, no diarrhea and no vomiting   pt reports starting yesterday she was feeling lightheaded As the day progressed she began to worsen No fever No vomiting She has had brief episodes of CP that is sharp and pressure and reports mild SOB, no CP at this time however No fever/vomiting/diarhea No new meds, only taking vyvanse for now No excessive heat exposure No blood loss She has had similar episodes previously Also tonight while getting up to bathroom she felt very lightheaded EMS was called.  She had brief syncopal episode but was helped down by family and no trauma reported She reports mild HA   Past Medical History:  Diagnosis Date  . Anemia   . Cancer [facility])    endometrial  . Cardiac microvascular disease   . CHF (congestive heart failure) ([place])   . Depression   . History of endometrial cancer    had hysterectomy in August 2015  . Mild diastolic dysfunction   . Narcolepsy   . Obstructive sleep apnea   . Pleural effusion, bilateral    3-4 years   . Solitary pulmonary nodule on lung CT    follow up CT recommended per medical records  . Thyroid disease   . Vitamin B 12 deficiency     Patient Active Problem List   Diagnosis Date Noted  . Hypotension 07/05/2016  .  Morbid obesity due to excess calories ([place]) 07/01/2016  . Solitary pulmonary nodule on lung CT   . Narcolepsy   . Vitamin B 12 deficiency   . Mild diastolic dysfunction   . Obstructive sleep apnea     Past Surgical History:  Procedure Laterality Date  . ABDOMINAL HYSTERECTOMY    . CHOLECYSTECTOMY    . 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 UPPER GI ENDOSCOPY  07/01/2016   Procedure: UPPER GI ENDOSCOPY;  Surgeon: Arta Bruce Kinsinger, MD;  Location: [facility] ORS;  Service: General;;    OB History    No data available       Home Medications    Prior to Admission medications   Medication Sig Start Date End Date Taking? Authorizing Provider  Calcium Carb-Cholecalciferol (CALCIUM 1000 + D PO) Take 1 tablet by mouth 3 (three) times daily.    [provider]  modafinil (PROVIGIL) 200 MG tablet Take 200 mg by mouth daily.  05/16/16   [provider]  ondansetron (ZOFRAN-ODT) 4 MG disintegrating tablet Take 4 mg by mouth every 6 (six) hours as needed. 06/12/16   [provider]  oxyCODONE (ROXICODONE) 5 MG/5ML solution Take 5-10 mLs  by mouth every 4 (four) hours as needed for pain. 06/14/16   [provider]  pantoprazole (PROTONIX) 40 MG tablet Take 40 mg by mouth daily. 06/12/16   [provider]  PRESCRIPTION MEDICATION Take 1 tablet by mouth 2 (two) times daily. BARIATRIC ADVANTAGE VITAMIN    [provider]  spironolactone (ALDACTONE) 50 MG tablet Take 50 mg by mouth daily.    [provider]  TURMERIC PO Take 1 tablet by mouth daily.     [provider]  Vilazodone HCl (VIIBRYD) 20 MG TABS Take 20 mg by mouth daily.    [provider]  vitamin B-12 (CYANOCOBALAMIN) 1000 MCG tablet Take 1,000  mcg by mouth daily.    [provider]    Family History Family History  Problem Relation Age of Onset  . Cancer Mother   . Brain cancer Mother   . Seizures Mother   . Stroke Mother   . Diabetes Father     Social History Social History  Substance Use Topics  . Smoking status: Never Smoker  . Smokeless tobacco: Never Used  . Alcohol use 0.0 oz/week     Comment: very seldom     Allergies   Ranexa [ranolazine]; Nsaids; and Verapamil   Review of Systems Review of Systems  Constitutional: Negative for fever.  Respiratory: Positive for shortness of breath.   Cardiovascular: Positive for chest pain and near-syncope.  Gastrointestinal: Negative for abdominal pain, blood in stool, diarrhea and vomiting.  Neurological: Positive for dizziness, syncope and headaches.  All other systems reviewed and are negative.    Physical Exam Updated Vital Signs BP (!) 62/33   Pulse 64   Temp 97.6 F (36.4 C)   Resp 15   Ht 1.676 m (5\' 6" )   Wt 111.1 kg (245 lb)   SpO2 (!) 83%   BMI 39.54 kg/m   Physical Exam CONSTITUTIONAL: ill appearing HEAD: Normocephalic/atraumatic EYES: EOMI/PERRL ENMT: Mucous membranes moist NECK: supple no meningeal signs SPINE/BACK:entire spine nontender CV: S1/S2 noted, no murmurs/rubs/gallops noted LUNGS: Lungs are clear to auscultation bilaterally, no apparent distress ABDOMEN: soft, nontender, no rebound or guarding, bowel sounds noted throughout abdomen GU:no cva tenderness NEURO: Pt is awake/alert/appropriate, moves all extremitiesx4.  No facial droop.  No arm or leg drift is noted EXTREMITIES: pulses normal/equal, full ROM SKIN: warm, color normal PSYCH: no abnormalities of mood noted, alert and oriented to situation   ED Treatments / Results  Labs (all labs ordered are listed, but only abnormal results are displayed) Labs Reviewed  BASIC METABOLIC PANEL - Abnormal; Notable for the following:       Result Value   Glucose, Bld  120 (*)    Creatinine, Ser 1.22 (*)    GFR calc non Af Amer 54 (*)    All other components within normal limits  CBC WITH DIFFERENTIAL/PLATELET  URINALYSIS, ROUTINE W REFLEX MICROSCOPIC  I-STAT BETA HCG BLOOD, ED ([facility], [facility], [facility] ONLY)  I-STAT TROPOININ, ED  I-STAT CG4 LACTIC ACID, ED    EKG  EKG Interpretation  Date/Time:  Saturday October 19 2016 05:53:41 EDT Ventricular Rate:  62 PR Interval:    QRS Duration: 106 QT Interval:  464 QTC Calculation: 472 R Axis:   18 Text Interpretation:  Sinus rhythm Low voltage, precordial leads Confirmed by Ripley Fraise [phone]) on 10/19/2016 6:04:58 AM       Radiology Dg Chest Port 1 View  Result Date: 10/19/2016 CLINICAL DATA:  Dizziness over the last 2 days.  Syncopal episode. EXAM: PORTABLE CHEST 1 VIEW COMPARISON:  None. FINDINGS: The heart size and mediastinal contours are within normal limits. Both lungs are clear. The visualized skeletal structures are unremarkable. IMPRESSION: No active disease. Electronically Signed   By: Nelson Chimes M.D.   On: 10/19/2016 06:54    Procedures Procedures  CRITICAL CARE Performed by: Sharyon Cable Total critical care time: 40 minutes Critical care time was exclusive of separately billable procedures and treating other patients. Critical care was necessary to treat or prevent imminent or life-threatening deterioration. Critical care was time spent personally by me on the following activities: development of treatment plan with patient and/or surrogate as well as nursing, discussions with consultants, evaluation of patient's response to treatment, examination of patient, obtaining history from patient or surrogate, ordering and performing treatments and interventions, ordering and review of laboratory studies, ordering and review of radiographic studies, pulse oximetry and re-evaluation of patient's condition. PATIENT WITH PERSISTENT HYPOTENSION DESPITE IV FLUIDS, PT WILL REQUIRE CRITICAL CARE  EVALUATION  Medications Ordered in ED Medications  sodium chloride 0.9 % bolus 1,000 mL (not administered)  sodium chloride 0.9 % bolus 1,000 mL (not administered)  sodium chloride 0.9 % bolus 1,000 mL (1,000 mLs Intravenous New Bag/Given 10/19/16 0620)     Initial Impression / Assessment and Plan / ED Course  I have reviewed the triage vital signs and the nursing notes.  Pertinent labs & imaging results that were available during my care of the patient were reviewed by me and considered in my medical decision making (see chart for details).     6:42 AM Pt in the ED syncopal episode She is hypotensive She is not septic appearing Will give IV fluids She has had similar episodes previously Last time it occurred after having gastric bypass She reports she has had it multiple times previously 8:00 AM LABS/IMAGING UNREMARKABLE NO SIGNS OF DISTRESS SHE IS MENTATING APPROPRIATELY LOW SUSPICION FOR PE GIVEN PREVIOUS SIMILAR EPISODES SHE TELLS ME SHE HAD AT LEAST 2 EPISODES WHILE IN [place], UNCLEAR CAUSE AFTER EXTENSIVE WORKUP  WILL NEED ADMISSION I D/W DR Alva Garnet WITH CRITICAL CARE FOR ADMISSION  Final Clinical Impressions(s) / ED Diagnoses   Final diagnoses:  Idiopathic hypotension    New Prescriptions New Prescriptions   No medications on file     Ripley Fraise, MD 10/19/16 [phone]

## 2016-10-19 NOTE — ED Notes (Signed)
Pt understood dc material. NAD noted. 

## 2016-10-19 NOTE — ED Notes (Signed)
Pt. Still Aox4. Pt. Color is good and sky warm and dry.

## 2016-10-19 NOTE — ED Provider Notes (Signed)
No signs of sepsis No signs of blood loss No signs of anaphylaxis No signs of cardiogenic shock Will continue IV fluids    Ripley Fraise, MD 10/19/16 [phone]

## 2016-10-19 NOTE — ED Triage Notes (Signed)
Pt presents via EMS with c/o of dizziness for 2 days and syncope this am. pts bp have been 70s/30-40. A+Ox4

## 2016-10-19 NOTE — ED Notes (Signed)
Able to find 2nd IV site, but unable to get blood. Fluids running through line below IV site. Phlebotomy made aware.

## 2016-10-19 NOTE — ED Notes (Addendum)
Attempted report. Phlebotomy at bedside. Will start remaining bolus when other bolus complete.

## 2016-10-19 NOTE — Progress Notes (Signed)
Dr. Gladys Damme made aware of Levophed initiated and patient only has peripheral access at this time.

## 2016-10-19 NOTE — ED Notes (Signed)
Pt. Has bag of clothing and purse. Pt. Educated on valuables policy and urged to give valuables to husband to bring home.

## 2016-10-19 NOTE — Plan of Care (Signed)
Came at bedside because of low BP  However patient is completely asymptomatic, her SBP is 78 and MAP 55. She has urinated twice in last 2 hours, each time 100 cc. Her capillary refill is good. She is perfusing well. Will lower MAP goal to 50 and monitor her clinically.   I discussed with the patient and offered her putting central line. She said she would like to avoid unless its absolutely necessary. I suspect she has adrenal insufficiency considering cortisol only 4. procalcitonin and lactic acid is normal so I am not suspecting septic shock here. Echo is pending however her echo in march 2018 was good. She is only on 2 liters [place].  I will give her 1 more liter NSS bolus and give stress dos steroids and start midodrine and continue to monitor to see if we can wean her off pressors.

## 2016-10-19 NOTE — ED Notes (Signed)
Attempted report 

## 2016-10-19 NOTE — H&P (Addendum)
PULMONARY / CRITICAL CARE MEDICINE   Name: Jennifer Mayo MRN: [id number] DOB: October 12, 1975    ADMISSION DATE:  10/19/2016  CHIEF COMPLAINT:  Lightheadedness, near syncope  HISTORY OF PRESENT ILLNESS:    This is a 41 year old female with history of gastric bypass in March 2018 who presents with low blood pressure. She woke up today morning around 4 AM and try to walk up to the restroom but became weak and dizzy and lethargic. She kind of slumped down to the floor which was witnessed by her husband. She did not lose consciousness or bump her head. She experienced some associated chest pain for less than a minute in the left precordial area radiating to the left arm. She said she experienced similar episodes for years ago and 8 years ago when she was admitted in [facility] in [place]. They moved to [facility] about 2 years ago.  Patient denies any shortness of breath fever chills nausea vomiting urinary or bowel complaints. No pedal edema. No sinus congestion. No urinary urgency frequency or diarrhea. She does feel cold all the time.  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 Patient also had similar episode a day after being discharged after her gastric bypass surgery in March 2018. At that time she had workup including CT chest which was negative for PE. Next  She has lost 60-70 pounds since her gastric bypass.  He has sleep apnea and is compliant with her CPAP. She is on medication for narcolepsy also  She has history of endometrial cancer and underwent hysterectomy.  PAST MEDICAL HISTORY :  She  has a past medical history of Anemia; Cancer ([place]); Cardiac microvascular disease; CHF (congestive heart failure) ([place]); Depression; History of endometrial cancer; Mild diastolic  dysfunction; Narcolepsy; Obstructive sleep apnea; Pleural effusion, bilateral; Solitary pulmonary nodule on lung CT; Thyroid disease; and Vitamin B 12 deficiency.  PAST SURGICAL HISTORY: She  has a past surgical history that includes Cholecystectomy; Abdominal hysterectomy; Laparoscopic roux-en-y gastric bypass with hiatal hernia repair (N/A, 07/01/2016); and Upper gi endoscopy (07/01/2016).  Allergies  Allergen Reactions  . Ranexa [Ranolazine] Shortness Of Breath  . Nsaids     Gastric bypass - ulcer risk  . Verapamil Other (See Comments)    hypotension    No current facility-administered medications on file prior to encounter.    Current Outpatient Prescriptions on File Prior to Encounter  Medication Sig  . Calcium Carb-Cholecalciferol (CALCIUM 1000 + D PO) Take 1 tablet by mouth 3 (three) times daily.  Marland Kitchen PRESCRIPTION MEDICATION Take 1 tablet by mouth 2 (two) times daily. BARIATRIC ADVANTAGE VITAMIN    FAMILY HISTORY:  Her indicated that her mother is alive. She indicated that the status of her father is unknown.    SOCIAL HISTORY: She  reports that she has never smoked. She has never used smokeless tobacco. She reports that she drinks alcohol. She reports that she does not use drugs.  REVIEW OF SYSTEMS:   Lightheadedness and dizziness which resolves when lying flat. No other complaints.   VITAL SIGNS: BP (!) 61/33   Pulse 62   Temp 97.6 F (36.4 C)   Resp (!) 23   Ht 5\' 6"  (1.676 m)   Wt 111.1 kg (245 lb)   SpO2 93%   BMI 39.54 kg/m  HEMODYNAMICS:  blood pressure in the 16X systolic on arrival, after fluid boluses now it's into the high 70s with a mean of 59  INTAKE / OUTPUT: I/O last 3 completed shifts: In: 850 [I.V.:850] Out: -   PHYSICAL EXAMINATION: General:  Alert awake oriented 3. No apparent distress. Neuro:  No neurological deficit. Following commands. Cranial nerves intact HEENT:  Atraumatic normocephalic per UMI Cardiovascular:  S1-S2 positive. No  murmurs rubs or gallops Lungs:  Clear to auscultation bilaterally Abdomen:  Benign. Musculoskeletal:  No cyanosis clubbing edema Skin:  No rash or any other findings  LABS:  BMET  Recent Labs Lab 10/19/16 0619  NA 137  K 3.9  CL 104  CO2 25  BUN 16  CREATININE 1.22*  GLUCOSE 120*    Electrolytes  Recent Labs Lab 10/19/16 0619  CALCIUM 8.9    CBC  Recent Labs Lab 10/19/16 0619  WBC 8.6  HGB 13.1  HCT 41.6  PLT 214    Coag's No results for input(s): APTT, INR in the last 168 hours.  Sepsis Markers  Recent Labs Lab 10/19/16 0631  LATICACIDVEN 0.99    ABG No results for input(s): PHART, PCO2ART, PO2ART in the last 168 hours.  Liver Enzymes No results for input(s): AST, ALT, ALKPHOS, BILITOT, ALBUMIN in the last 168 hours.  Cardiac Enzymes No results for input(s): TROPONINI, PROBNP in the last 168 hours.  Glucose No results for input(s): GLUCAP in the last 168 hours.  Imaging Dg Chest Port 1 View  Result Date: 10/19/2016 CLINICAL DATA:  Dizziness over the last 2 days.  Syncopal episode. EXAM: PORTABLE CHEST 1 VIEW COMPARISON:  None. FINDINGS: The heart size and mediastinal contours are within normal limits. Both lungs are clear. The visualized skeletal structures are unremarkable. IMPRESSION: No active disease. Electronically Signed   By: Nelson Chimes M.D.   On: 10/19/2016 06:54     STUDIES:  Echocardiogram ordered Chest x-ray in the ER was normal  CULTURES: Blood sputum and urine cultures ordered  ANTIBIOTICS: Shall hold off on empiric antibiotics unless lactic acid or procalcitonin comes back elevated   LINES/TUBES: None at this moment   DISCUSSION: This is a 41 year old female with recent history of gastric bypass 3 months ago coming with symptomatic hypotension and near syncope. Patient has had this similar episode 4 and 8 years ago. So far her workup has been unrevealing except for "microvascular heart disease" based on stress  MRI  I suspect she is vascularly depleted considering her elevated creatinine. I would start with giving fluids to see if that helps with improvement in her blood pressure and symptoms which are only present when she is standing. After adequate fluid replacement and ruling out sepsis, if she continues to stay hypotensive, she may need further workup including neuroimaging and further cardio workup, possibly tilt table test  ASSESSMENT / PLAN:  PULMONARY A: OSA P:   Continuous pulse oximetry on room air. Provide supplemental oxygen if necessary Use home CPAP at night and with naps  CARDIOVASCULAR A:  Near syncope P:  Suspect secondary to hypovolemia and orthostasis. Continue IV fluids. Check echocardiogram. Also check cortisol stimulation test and TSH T4. ? Autonomic dysregulation  Cortisol level on the lower side, costim test was ordered but for some reason not done, I will start her on empiric stress dose steroids.  RENAL A:   Acute kidney injury P:   Likely from hypovolemia. IV fluids. Repeat blood work in morning  GASTROINTESTINAL A:   Status  post gastric bypass P:   Multivitamin supplementation  HEMATOLOGIC A:   DVT prophylaxis P:  Heparin subcutaneous  INFECTIOUS A:   Check blood sputum and urine cultures P:   In past patient was found to have urosepsis. Since she doesn't have any signs of urinary frequency or urgency, I will hold off on an antibiotic unless lactic acid or procalcitonin comes elevated. Her initial set of lactic acid was negative  ENDOCRINE  ICU glycemic scale  NEUROLOGIC A:   Patient has no symptoms when laying flat. P:   Continue to monitor. May need neuroimaging once hypovolemia is treated and sepsis is ruled out.   FAMILY  - Updates: Discussed with patient and her husband area  STAFF NOTE: I, Sharia Reeve, MD have personally reviewed patient's available data, including medical history, events of note, physical examination and test  results as part of my evaluation.  The patient is critically ill with multiple organ systems failure and requires high complexity decision making for assessment and support, frequent evaluation and titration of therapies, application of advanced monitoring technologies and extensive interpretation of multiple databases.   Critical Care Time devoted to patient care services described in this note is 40  Minutes. This time reflects time of care of this signee: Sharia Reeve, MD. This critical care time does not reflect procedure time, or teaching time or supervisory time of PA/NP/Med student/Med Resident etc but could involve care discussion time.   Pulmonary and [place] Pager: [phone]  10/19/2016, 9:07 AM

## 2016-10-19 NOTE — ED Notes (Signed)
Pt. In trendelenburg

## 2016-10-20 ENCOUNTER — Inpatient Hospital Stay (HOSPITAL_COMMUNITY): Payer: 59

## 2016-10-20 DIAGNOSIS — I951 Orthostatic hypotension: Secondary | ICD-10-CM

## 2016-10-20 DIAGNOSIS — I340 Nonrheumatic mitral (valve) insufficiency: Secondary | ICD-10-CM

## 2016-10-20 DIAGNOSIS — Z452 Encounter for adjustment and management of vascular access device: Secondary | ICD-10-CM

## 2016-10-20 DIAGNOSIS — J9600 Acute respiratory failure, unspecified whether with hypoxia or hypercapnia: Secondary | ICD-10-CM

## 2016-10-20 LAB — ECHOCARDIOGRAM COMPLETE
HEIGHTINCHES: 66 in
Weight: 4162.28 oz

## 2016-10-20 LAB — BASIC METABOLIC PANEL
Anion gap: 7 (ref 5–15)
BUN: 16 mg/dL (ref 6–20)
CO2: 19 mmol/L — ABNORMAL LOW (ref 22–32)
CREATININE: 1.35 mg/dL — AB (ref 0.44–1.00)
Calcium: 8.5 mg/dL — ABNORMAL LOW (ref 8.9–10.3)
Chloride: 107 mmol/L (ref 101–111)
GFR, EST AFRICAN AMERICAN: 56 mL/min — AB (ref >60)
GFR, EST NON AFRICAN AMERICAN: 48 mL/min — AB (ref >60)
Glucose, Bld: 237 mg/dL — ABNORMAL HIGH (ref 65–99)
POTASSIUM: 4 mmol/L (ref 3.5–5.1)
SODIUM: 133 mmol/L — AB (ref 135–145)

## 2016-10-20 LAB — PHOSPHORUS: Phosphorus: 2.6 mg/dL (ref 2.5–4.6)

## 2016-10-20 LAB — ACTH STIMULATION, 3 TIME POINTS
Cortisol, 30 Min: 17.9 ug/dL
Cortisol, 60 Min: 16.3 ug/dL
Cortisol, Base: 17.3 ug/dL

## 2016-10-20 LAB — CBC
HCT: 39.2 % (ref 36.0–46.0)
Hemoglobin: 12.7 g/dL (ref 12.0–15.0)
MCH: 27.1 pg (ref 26.0–34.0)
MCHC: 32.4 g/dL (ref 30.0–36.0)
MCV: 83.8 fL (ref 78.0–100.0)
PLATELETS: 289 10*3/uL (ref 150–400)
RBC: 4.68 MIL/uL (ref 3.87–5.11)
RDW: 14 % (ref 11.5–15.5)
WBC: 12.3 10*3/uL — ABNORMAL HIGH (ref 4.0–10.5)

## 2016-10-20 LAB — URINE CULTURE

## 2016-10-20 LAB — GLUCOSE, CAPILLARY
GLUCOSE-CAPILLARY: 168 mg/dL — AB (ref 65–99)
GLUCOSE-CAPILLARY: 171 mg/dL — AB (ref 65–99)
GLUCOSE-CAPILLARY: 203 mg/dL — AB (ref 65–99)
GLUCOSE-CAPILLARY: 206 mg/dL — AB (ref 65–99)
Glucose-Capillary: 225 mg/dL — ABNORMAL HIGH (ref 65–99)
Glucose-Capillary: 171 mg/dL — ABNORMAL HIGH (ref 65–99)

## 2016-10-20 LAB — MAGNESIUM: MAGNESIUM: 1.9 mg/dL (ref 1.7–2.4)

## 2016-10-20 MED ORDER — SODIUM CHLORIDE 0.9% FLUSH
10.0000 mL | Freq: Two times a day (BID) | INTRAVENOUS | Status: DC
  Administered 2016-10-20 – 2016-10-22 (×4): 10 mL

## 2016-10-20 MED ORDER — PREMIER PROTEIN SHAKE
2.0000 [oz_av] | Freq: Two times a day (BID) | ORAL | Status: DC
  Administered 2016-10-20 – 2016-10-24 (×7): 2 [oz_av] via ORAL
  Filled 2016-10-20 (×13): qty 325.31

## 2016-10-20 MED ORDER — DEXTROSE 5 % IV SOLN
1.0000 g | INTRAVENOUS | Status: DC
  Administered 2016-10-20 – 2016-10-22 (×3): 1 g via INTRAVENOUS
  Filled 2016-10-20 (×3): qty 10

## 2016-10-20 MED ORDER — SODIUM CHLORIDE 0.9% FLUSH
10.0000 mL | INTRAVENOUS | Status: DC | PRN
  Administered 2016-10-20: 10 mL
  Filled 2016-10-20: qty 40

## 2016-10-20 MED ORDER — NOREPINEPHRINE BITARTRATE 1 MG/ML IV SOLN
0.0000 ug/min | INTRAVENOUS | Status: DC
  Administered 2016-10-21: 38 ug/min via INTRAVENOUS
  Administered 2016-10-21: 36 ug/min via INTRAVENOUS
  Filled 2016-10-20 (×2): qty 16

## 2016-10-20 MED ORDER — PERFLUTREN LIPID MICROSPHERE
1.0000 mL | INTRAVENOUS | Status: AC | PRN
  Administered 2016-10-20: 2 mL via INTRAVENOUS
  Filled 2016-10-20: qty 10

## 2016-10-20 MED ORDER — ATROPINE SULFATE 1 MG/10ML IJ SOSY
PREFILLED_SYRINGE | INTRAMUSCULAR | Status: AC
  Filled 2016-10-20: qty 10

## 2016-10-20 MED ORDER — SODIUM CHLORIDE 0.9 % IV SOLN
250.0000 mL | INTRAVENOUS | Status: DC | PRN
  Administered 2016-10-21: 250 mL via INTRAVENOUS

## 2016-10-20 MED ORDER — CHLORHEXIDINE GLUCONATE CLOTH 2 % EX PADS
6.0000 | MEDICATED_PAD | Freq: Every day | CUTANEOUS | Status: DC
  Administered 2016-10-20 – 2016-10-24 (×4): 6 via TOPICAL

## 2016-10-20 MED ORDER — LIDOCAINE HCL (PF) 1 % IJ SOLN
INTRAMUSCULAR | Status: AC
  Administered 2016-10-20: 5 mL
  Filled 2016-10-20: qty 10

## 2016-10-20 MED ORDER — LACTATED RINGERS IV BOLUS (SEPSIS)
1000.0000 mL | Freq: Once | INTRAVENOUS | Status: AC
  Administered 2016-10-20: 1000 mL via INTRAVENOUS

## 2016-10-20 NOTE — H&P (Signed)
PULMONARY / CRITICAL CARE MEDICINE   Name: Jennifer Mayo MRN: [id number] DOB: September 24, 1975    ADMISSION DATE:  10/19/2016  CHIEF COMPLAINT:  Lightheadedness, near syncope  HISTORY OF PRESENT ILLNESS:    This is a 41 year old female with history of gastric bypass in March 2018 who presents with low blood pressure. She woke up today morning around 4 AM and try to walk up to the restroom but became weak and dizzy and lethargic. She kind of slumped down to the floor which was witnessed by her husband. She did not lose consciousness or bump her head. She experienced some associated chest pain for less than a minute in the left precordial area radiating to the left arm. She said she experienced similar episodes for years ago and 8 years ago when she was admitted in [facility] in [place]. They moved to [facility] about 2 years ago.  Patient denies any shortness of breath fever chills nausea vomiting urinary or bowel complaints. No pedal edema. No sinus congestion. No urinary urgency frequency or diarrhea. She does feel cold all the time.  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 Patient also had similar episode a day after being discharged after her gastric bypass surgery in March 2018. At that time she had workup including CT chest which was negative for PE. Next  She has lost 60-70 pounds since her gastric bypass.  He has sleep apnea and is compliant with her CPAP. She is on medication for narcolepsy also  She has history of endometrial cancer and underwent hysterectomy.  Subjective: awake on levo 40 , vaso  VITAL SIGNS: BP (!) 81/38   Pulse 63   Temp 98.3 F (36.8 C) (Oral)   Resp (!) 22   Ht 5\' 6"  (1.676 m)   Wt 118 kg (260 lb 2.3 oz)   SpO2 96%   BMI 41.99 kg/m    HEMODYNAMICS:  blood pressure in the 40J systolic on arrival, after fluid boluses now it's into the high 70s with a mean of 59  INTAKE / OUTPUT: I/O last 3 completed shifts: In: 7203.8 [P.O.:118; I.V.:6035.8; IV Piggyback:1050] Out: 395 [Urine:395]  PHYSICAL EXAMINATION: General: fully awake,a lert Neuro: nonfocal, perrl HEENT: jvd down PULM: clear, no crackles CV:  s1 s2 RRR no r/g GI: soft, bs wnl, no r no g Extremities:  No rash, no joint swelling   LABS:  BMET  Recent Labs Lab 10/19/16 0619 10/19/16 0942 10/20/16 0203  NA 137  --  133*  K 3.9  --  4.0  CL 104  --  107  CO2 25  --  19*  BUN 16  --  16  CREATININE 1.22* 1.13* 1.35*  GLUCOSE 120*  --  237*    Electrolytes  Recent Labs Lab 10/19/16 0619 10/20/16 0203  CALCIUM 8.9 8.5*  MG  --  1.9  PHOS  --  2.6    CBC  Recent Labs Lab 10/19/16 0619 10/19/16 0942 10/20/16 0203  WBC 8.6 8.2 12.3*  HGB 13.1 13.0 12.7  HCT 41.6 41.0 39.2  PLT 214 217 289    Coag's No results for input(s): APTT, INR in the last 168 hours.  Sepsis Markers  Recent Labs Lab 10/19/16 0631 10/19/16 0942 10/19/16 0947 10/19/16 1312  LATICACIDVEN 0.99  --  1.5 1.3  PROCALCITON  --  <0.10  --   --     ABG No results for input(s): PHART, PCO2ART, PO2ART in the last 168 hours.  Liver Enzymes No results for input(s): AST, ALT, ALKPHOS, BILITOT, ALBUMIN in the last 168 hours.  Cardiac Enzymes  Recent Labs Lab 10/19/16 0942 10/19/16 1403 10/19/16 2022  TROPONINI <0.03 <0.03 <0.03    Glucose  Recent Labs Lab 10/19/16 1131 10/19/16 1557 10/19/16 2009 10/19/16 2309 10/20/16 0328 10/20/16 0758  GLUCAP 100* 120* 196* 211* 225* 203*    Imaging No results found.   STUDIES:  Echocardiogram 7/1>>> Chest x-ray in the ER was normal  CULTURES: Blood sputum 6/30>>> Urine 6/30>>>  ANTIBIOTICS:   LINES/TUBES: None at this moment   DISCUSSION: This is a 41 year old female with recent  history of gastric bypass 3 months ago coming with symptomatic hypotension and near syncope. Patient has had this similar episode 4 and 8 years ago. So far her workup has been unrevealing except for "microvascular heart disease" based on stress MRI   ASSESSMENT / PLAN:  PULMONARY A: OSA P:   Nocturnal cpap pcxr for pulm edema risk in am   CARDIOVASCULAR A:  Near syncope Hypovolemia Slight rel AI ( does NOT explain this degree fo shock) P:  Place cvp Place a line Flornef, steroids Pos balance Make echo stat Keep vaso, levo to map goal Get echo,m if hyperdynamic would consider ang 2 May also apply noninvasive monitoring Empiric ABX for now  RENAL A:   Acute kidney injury, slight ATN P:   cvp Pos balance Re add saline  GASTROINTESTINAL A:   Status post gastric bypass P:   Multivitamin supplementation Diet may need to hold  HEMATOLOGIC A:   DVT prophylaxis P:  Heparin subcutaneous  INFECTIOUS A:   R/o infection P:   Consider empiric bacteremic coverage as on high dose pressors Echo now Pct repeat  ENDOCRINE  ICU glycemic scale Rel AI  -mild, steroids, flronef  NEUROLOGIC A:   Patient has no symptoms when laying flat. P:   Continue to monitor May need tilt table as outpt  FAMILY  - Updates: Discussed with patient   Ccm time 35 min   Lavon Paganini. Titus Mould, MD, [place] Pgr: [facility]

## 2016-10-20 NOTE — Progress Notes (Signed)
  Echocardiogram 2D Echocardiogram has been performed.  Jennette Dubin 10/20/2016, 11:14 AM

## 2016-10-20 NOTE — Procedures (Signed)
Central Venous Catheter Insertion Procedure Note Jennifer Mayo [id number] Mar 20, 1976  Procedure: Insertion of Central Venous Catheter Indications: Drug and/or fluid administration  Procedure Details Consent: Risks of procedure as well as the alternatives and risks of each were explained to the (patient/caregiver).  Consent for procedure obtained. Time Out: Verified patient identification, verified procedure, site/side was marked, verified correct patient position, special equipment/implants available, medications/allergies/relevent history reviewed, required imaging and test results available.  Performed  Ultrasound imaging was used for left IJ central venous line placement  Maximum sterile technique was used including antiseptics, cap, gloves, gown, hand hygiene, mask and sheet. Skin prep: Chlorhexidine; local anesthetic administered A antimicrobial bonded/coated triple lumen catheter was placed in the left internal jugular vein using the Seldinger technique.  Evaluation Blood flow good Complications: No apparent complications Patient did tolerate procedure well. Chest X-ray ordered to verify placement.  CXR: pending.  Jennifer Mayo 10/20/2016, 8:30 AM

## 2016-10-21 ENCOUNTER — Inpatient Hospital Stay (HOSPITAL_COMMUNITY): Payer: 59

## 2016-10-21 ENCOUNTER — Encounter (HOSPITAL_COMMUNITY): Payer: 59

## 2016-10-21 DIAGNOSIS — R0989 Other specified symptoms and signs involving the circulatory and respiratory systems: Secondary | ICD-10-CM

## 2016-10-21 DIAGNOSIS — R0602 Shortness of breath: Secondary | ICD-10-CM

## 2016-10-21 LAB — GLUCOSE, CAPILLARY
GLUCOSE-CAPILLARY: 152 mg/dL — AB (ref 65–99)
GLUCOSE-CAPILLARY: 142 mg/dL — AB (ref 65–99)
GLUCOSE-CAPILLARY: 115 mg/dL — AB (ref 65–99)
Glucose-Capillary: 129 mg/dL — ABNORMAL HIGH (ref 65–99)
Glucose-Capillary: 112 mg/dL — ABNORMAL HIGH (ref 65–99)

## 2016-10-21 LAB — COMPREHENSIVE METABOLIC PANEL
ALT: 66 U/L — AB (ref 14–54)
AST: 44 U/L — AB (ref 15–41)
Albumin: 3 g/dL — ABNORMAL LOW (ref 3.5–5.0)
Alkaline Phosphatase: 104 U/L (ref 38–126)
Anion gap: 3 — ABNORMAL LOW (ref 5–15)
BILIRUBIN TOTAL: 0.4 mg/dL (ref 0.3–1.2)
BUN: 18 mg/dL (ref 6–20)
CHLORIDE: 102 mmol/L (ref 101–111)
CO2: 24 mmol/L (ref 22–32)
CREATININE: 0.77 mg/dL (ref 0.44–1.00)
Calcium: 8.4 mg/dL — ABNORMAL LOW (ref 8.9–10.3)
GFR calc Af Amer: >60 mL/min (ref >60)
Glucose, Bld: 161 mg/dL — ABNORMAL HIGH (ref 65–99)
Potassium: 3.6 mmol/L (ref 3.5–5.1)
Sodium: 129 mmol/L — ABNORMAL LOW (ref 135–145)
TOTAL PROTEIN: 5.5 g/dL — AB (ref 6.5–8.1)

## 2016-10-21 LAB — CBC WITH DIFFERENTIAL/PLATELET
Basophils Absolute: 0 10*3/uL (ref 0.0–0.1)
Basophils Relative: 0 %
EOS ABS: 0 10*3/uL (ref 0.0–0.7)
EOS PCT: 0 %
HCT: 36.2 % (ref 36.0–46.0)
Hemoglobin: 11.7 g/dL — ABNORMAL LOW (ref 12.0–15.0)
LYMPHS ABS: 1.9 10*3/uL (ref 0.7–4.0)
Lymphocytes Relative: 14 %
MCH: 26.4 pg (ref 26.0–34.0)
MCHC: 32.3 g/dL (ref 30.0–36.0)
MCV: 81.5 fL (ref 78.0–100.0)
Monocytes Absolute: 1 10*3/uL (ref 0.1–1.0)
Monocytes Relative: 7 %
Neutro Abs: 10.9 10*3/uL — ABNORMAL HIGH (ref 1.7–7.7)
Neutrophils Relative %: 79 %
PLATELETS: 223 10*3/uL (ref 150–400)
RBC: 4.44 MIL/uL (ref 3.87–5.11)
RDW: 14 % (ref 11.5–15.5)
WBC: 13.7 10*3/uL — AB (ref 4.0–10.5)

## 2016-10-21 LAB — URINALYSIS, ROUTINE W REFLEX MICROSCOPIC
BILIRUBIN URINE: NEGATIVE
Glucose, UA: NEGATIVE mg/dL
HGB URINE DIPSTICK: NEGATIVE
KETONES UR: NEGATIVE mg/dL
Leukocytes, UA: NEGATIVE
Nitrite: NEGATIVE
PROTEIN: NEGATIVE mg/dL
Specific Gravity, Urine: >1.03 — ABNORMAL HIGH (ref 1.005–1.030)
pH: 6 (ref 5.0–8.0)

## 2016-10-21 LAB — SODIUM, URINE, RANDOM: SODIUM UR: 38 mmol/L

## 2016-10-21 LAB — OSMOLALITY, URINE: Osmolality, Ur: 843 mOsm/kg (ref 300–900)

## 2016-10-21 LAB — BRAIN NATRIURETIC PEPTIDE: B NATRIURETIC PEPTIDE 5: 672.9 pg/mL — AB (ref 0.0–100.0)

## 2016-10-21 LAB — PROCALCITONIN: Procalcitonin: <0.1 ng/mL

## 2016-10-21 MED ORDER — SODIUM CHLORIDE 0.9 % IV SOLN: 250.0000 mL | INTRAVENOUS | Status: DC | PRN

## 2016-10-21 NOTE — Consult Note (Signed)
CARDIOLOGY CONSULT NOTE  Patient ID: Jennifer Mayo MRN: [id number] DOB/AGE: 1975/06/01 41 y.o.  Admit date: 10/19/2016 Referring Physician  Nadara Mode, MD Primary Physician:  Raelyn Number, MD Reason for Consultation  Hypotension  HPI: Jennifer Mayo  is a 41 y.o. female. 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  Previously done well, no bowel changes, no fever, no headache or visual disturbances, episode occurred without any remonitor his symptoms that was preceding a day or 2 before.  She was in her normal routine health.  She has had similar episode 4 years ago and was severely hypotensive and etiology could not be determined.  She is now being worked up for hypoadrenalism, sepsis.  She had one more episode of chest pain this morning but states that this is a routine angina and was very mild.  She is presently on vasopressin and also Levophed to maintain blood pressure.  Past Medical History:  Diagnosis Date  . Anemia   . Cancer [facility])    endometrial  . Cardiac microvascular disease   . CHF (congestive heart failure) ([place])   . Depression   . History of endometrial cancer    had hysterectomy in August 2015  . Mild diastolic dysfunction   . Narcolepsy   . Obstructive sleep apnea   . Pleural effusion, bilateral    3-4 years   . Solitary pulmonary nodule on lung CT    follow up CT recommended per medical records  . Thyroid disease   . Vitamin B 12  deficiency      Past Surgical History:  Procedure Laterality Date  . ABDOMINAL HYSTERECTOMY    . CHOLECYSTECTOMY    . 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 UPPER GI ENDOSCOPY  07/01/2016   Procedure: UPPER GI ENDOSCOPY;  Surgeon: Arta Bruce Kinsinger, MD;  Location: [facility];  Service: General;;     Family History  Problem Relation Age of Onset  . Cancer Mother   . Brain cancer Mother   . Seizures Mother   . Stroke Mother   . Diabetes Father      Social History: Social History   Social History  . Marital status: Married    Spouse name: N/A  . Number of children: N/A  . Years of education: N/A   Occupational History  . Not on file.   Social History Main Topics  . Smoking status: Never Smoker  . Smokeless tobacco: Never Used  . Alcohol use 0.0 oz/week     Comment: very seldom  . Drug use: No  . Sexual activity: Not on file   Other Topics Concern  . Not on file   Social History Narrative   One cup of caffeine daily.     Prescriptions Prior to  Admission  Medication Sig Dispense Refill Last Dose  . Calcium Carb-Cholecalciferol (CALCIUM 1000 + D PO) Take 1 tablet by mouth 3 (three) times daily.   10/18/2016 at Unknown time  . lisdexamfetamine (VYVANSE) 50 MG capsule Take 50 mg by mouth daily.   10/18/2016 at Unknown time  . PRESCRIPTION MEDICATION Take 1 tablet by mouth 2 (two) times daily. BARIATRIC ADVANTAGE VITAMIN   10/18/2016 at Unknown time     Review of Systems - General ROS: positive for  - fatigue, weight loss and intetional Hematological and Lymphatic ROS: negative for - bleeding problems, jaundice, night sweats, pallor or swollen lymph nodes Endocrine ROS: negative for - breast changes, galactorrhea, hot flashes, palpitations or  polydipsia/polyuria Respiratory ROS: positive for - shortness of breath and mild and stable Cardiovascular ROS: negative for - edema, orthopnea, paroxysmal nocturnal dyspnea or rapid heart rate Gastrointestinal ROS: no abdominal pain, change in bowel habits, or black or bloody stools Genito-Urinary ROS: no dysuria, trouble voiding, or hematuria Neurological ROS: no TIA or stroke symptoms    Physical Exam: Blood pressure 106/60, pulse 74, temperature 98.3 F (36.8 C), temperature source Oral, resp. rate (!) 21, height 5\' 6"  (1.676 m), weight 124 kg (273 lb 5.9 oz), SpO2 92 %. Body mass index is 44.12 kg/m.   General appearance: alert, cooperative, appears stated age, no distress and morbidly obese Lungs: clear to auscultation bilaterally Chest wall: no tenderness Heart: S1, S2 normal, no S3 or S4 and systolic murmur: systolic ejection 2/6, crescendo at 2nd right intercostal space Abdomen: soft, non-tender; bowel sounds normal; no masses,  no organomegaly and obese Extremities: extremities normal, atraumatic, no cyanosis or edema Pulses: 2+ and symmetric Right arm BP 110/70 and left arm 139/76.  Prominent right subclavian bruit heard,conducted to the left subclavianinfraclavicular region also but faint. Neurologic: Grossly normal  Labs:  Lab Results  Component Value Date   WBC 13.7 (H) 10/21/2016   HGB 11.7 (L) 10/21/2016   HCT 36.2 10/21/2016   MCV 81.5 10/21/2016   PLT 223 10/21/2016    Recent Labs Lab 10/21/16 0413  NA 129*  K 3.6  CL 102  CO2 24  BUN 18  CREATININE 0.77  CALCIUM 8.4*  PROT 5.5*  BILITOT 0.4  ALKPHOS 104  ALT 66*  AST 44*  GLUCOSE 161*    Lipid Panel  No results found for: CHOL, TRIG, HDL, CHOLHDL, VLDL, LDLCALC  BNP (last 3 results)  Recent Labs  07/05/16 1640 07/06/16 0513 10/19/16 0946  BNP 61.9 108.0* 85.1    HEMOGLOBIN A1C No results found for: HGBA1C, MPG  Cardiac Panel (last 3 results)  Recent Labs  10/19/16 0942  10/19/16 1403 10/19/16 2022  TROPONINI <0.03 <0.03 <0.03    Lab Results  Component Value Date   TROPONINI <0.03 10/19/2016     TSH  Recent Labs  07/05/16 1724  TSH 2.631    Radiology: Dg Chest Port 1 View  Result Date: 10/21/2016 CLINICAL DATA:  Respiratory failure. EXAM: PORTABLE CHEST 1 VIEW COMPARISON:  10/20/2016. FINDINGS: Left IJ line in stable position. Heart size normal. Mild bibasilar subsegmental atelectasis. Small left pleural effusion cannot be excluded. No pneumothorax. IMPRESSION: 1. Left IJ line stable position. 2. Mild bibasilar atelectasis. Small left pleural effusion cannot be excluded. Electronically Signed   By: Marcello Moores  Register   On: 10/21/2016 06:21   Dg Chest Port 1 View  Result Date: 10/20/2016 CLINICAL DATA:  Central line placement EXAM: PORTABLE CHEST 1 VIEW COMPARISON:  Earlier same  day FINDINGS: Left internal jugular central line has its tip either in the SVC at the azygos level or within the azygos vein itself. No pneumothorax. Minimal left base atelectasis. IMPRESSION: Left internal jugular central line tip either in the SVC at the azygos level or in the azygos vein. No pneumothorax. Electronically Signed   By: Nelson Chimes M.D.   On: 10/20/2016 10:50   Dg Chest Port 1 View  Result Date: 10/20/2016 CLINICAL DATA:  Shortness of breath.  Morbid obesity. EXAM: PORTABLE CHEST 1 VIEW COMPARISON:  10/19/2016 FINDINGS: The heart size and mediastinal contours are within normal limits. Low lung volumes are noted, however both lungs are clear. No evidence of pneumothorax or pleural effusion. IMPRESSION: Low lung volumes.  No active disease. Electronically Signed   By: Earle Gell M.D.   On: 10/20/2016 10:21    Scheduled Meds: . B-complex with vitamin C  1 tablet Oral Daily  . calcium-vitamin D  1 tablet Oral TID  . Chlorhexidine Gluconate Cloth  6 each Topical Daily  . fludrocortisone  0.1 mg Oral Daily  . heparin  5,000 Units Subcutaneous Q8H  . hydrocortisone  sod succinate (SOLU-CORTEF) inj  50 mg Intravenous Q6H  . insulin aspart  2-6 Units Subcutaneous Q4H  . lisdexamfetamine  50 mg Oral Daily  . mouth rinse  15 mL Mouth Rinse BID  . midodrine  10 mg Oral TID WC  . protein supplement shake  2 oz Oral BID BM  . sodium chloride flush  10-40 mL Intracatheter Q12H   Continuous Infusions: . sodium chloride 250 mL (10/21/16 0942)  . cefTRIAXone (ROCEPHIN)  IV 1 g (10/21/16 0943)  . famotidine (PEPCID) IV Stopped (10/20/16 2240)  . norepinephrine (LEVOPHED) Adult infusion 28 mcg/min (10/21/16 0943)  . vasopressin (PITRESSIN) infusion - *FOR SHOCK* 0.03 Units/min (10/21/16 0400)   PRN Meds:.sodium chloride, acetaminophen **OR** acetaminophen (TYLENOL) oral liquid 160 mg/5 mL, albuterol, docusate, ondansetron (ZOFRAN) IV, sodium chloride flush  CARDIAC STUDIES:  EKG: normal EKG, normal sinus rhythm, unchanged from previous tracings.  Echo: 10/20/2016: Hyperdynamic LVEF, 65-70%.  Normal diastolic parameters.  Mildly thickened mitral valve, mild MR.  No significant valvular of normality otherwise.  ASSESSMENT AND PLAN:  1.  Syncope/near syncope with severe hypotension, etiology unexplained. 2. Morbid obesity with obstructive sleep apnea, status post gastric surgery in March 2018. 3.  History of angina pectoris with normal coronary arteries, microvascular angina pectoris. 4.  History of narcolepsy 5.  Hypertension, presently hypotensive.  Recommendation: She is extremely complex with regard to her presentation, I am not sure about the etiology.  There is no obvious sepsis, although initial plasma cortisol level was markedly reduced at 4.5 at presentation.  Hence her presentation may be more consistent with acute adrenal insufficiency, we could consider ACTH stimulation test, however patient is also on steroids and makes evaluation very complex and difficult.  Continue steroids for now, would recommend weaning off of pressors rapidly as long as  patient is asymptomatic and mean arterial pressure is greater than 50-60 mmHg with adequate urine of it.  Urine output has dropped since being on pressors.  She also has hyperdynamic left ventricle and hence challenging with rapid wean may be appropriate.  I'll also start her on fludrocortisone although patient is on high-dose steroids presently.  Continue midodrine.  Patient presented similarly 4 years ago and no etiology could be found and symptoms spontaneously resolved.  Adrian Prows, MD 10/21/2016, 9:44 AM [facility] Pager: [phone] Office: [phone] If  no answer Cell [phone]

## 2016-10-21 NOTE — Progress Notes (Signed)
VASCULAR LAB PRELIMINARY  PRELIMINARY  PRELIMINARY  PRELIMINARY  Bilateral lower extremity venous duplex completed.    Preliminary report:  There is no DVT or SVT noted in the bilateral lower extremities.   Lavonne Cass, RVT 10/21/2016, 6:48 PM

## 2016-10-21 NOTE — Progress Notes (Signed)
PULMONARY / CRITICAL CARE MEDICINE   Name: Jennifer Mayo MRN: [id number] DOB: 1975/10/09    ADMISSION DATE:  10/19/2016  CHIEF COMPLAINT:  Lightheadedness, near syncope  HISTORY OF PRESENT ILLNESS:    This is a 41 year old female with history of gastric bypass in March 2018 who presents with low blood pressure. She woke up today morning around 4 AM and try to walk up to the restroom but became weak and dizzy and lethargic. She kind of slumped down to the floor which was witnessed by her husband. She did not lose consciousness or bump her head. She experienced some associated chest pain for less than a minute in the left precordial area radiating to the left arm. She said she experienced similar episodes for years ago and 8 years ago when she was admitted in [facility] in [place]. They moved to [facility] about 2 years ago.  Patient denies any shortness of breath fever chills nausea vomiting urinary or bowel complaints. No pedal edema. No sinus congestion. No urinary urgency frequency or diarrhea. She does feel cold all the time.  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 Patient also had similar episode a day after being discharged after her gastric bypass surgery in March 2018. At that time she had workup including CT chest which was negative for PE. Next  She has lost 60-70 pounds since her gastric bypass.  He has sleep apnea and is compliant with her CPAP. She is on medication for narcolepsy also  She has history of endometrial cancer and underwent hysterectomy.  Subjective:  Some difference in cuff pressures Levo 35 mic still on  cvp 12-17  VITAL SIGNS: BP 106/60 (BP Location: Right Arm)   Pulse 74   Temp 98.3 F (36.8 C) (Oral)   Resp (!) 21   Ht 5\' 6"  (1.676  m)   Wt 124 kg (273 lb 5.9 oz)   SpO2 92%   BMI 44.12 kg/m   HEMODYNAMICS: CVP:  [8 mmHg-17 mmHg] 17 mmHgblood pressure in the 90W systolic on arrival, after fluid boluses now it's into the high 70s with a mean of 59  INTAKE / OUTPUT: I/O last 3 completed shifts: In: 4097 [I.V.:5544; IV Piggyback:1200] Out: 250 [Urine:250]  PHYSICAL EXAMINATION: General: awake, alert, oriented Neuro: nonfocal, Alert HEENT: obese neck PULM: no crackles CV:  s1 s2 RRR distant GI: soft, bs wnl, no r Extremities: edema sligth    LABS:  BMET  Recent Labs Lab 10/19/16 0619 10/19/16 0942 10/20/16 0203 10/21/16 0413  NA 137  --  133* 129*  K 3.9  --  4.0 3.6  CL 104  --  107 102  CO2 25  --  19* 24  BUN 16  --  16 18  CREATININE 1.22* 1.13* 1.35* 0.77  GLUCOSE 120*  --  237* 161*    Electrolytes  Recent Labs Lab 10/19/16 0619 10/20/16 0203 10/21/16 0413  CALCIUM 8.9 8.5* 8.4*  MG  --  1.9  --   PHOS  --  2.6  --     CBC  Recent Labs Lab 10/19/16 0942 10/20/16 0203 10/21/16 0413  WBC 8.2 12.3* 13.7*  HGB 13.0 12.7 11.7*  HCT 41.0 39.2 36.2  PLT 217 289 223    Coag's No results for input(s):  APTT, INR in the last 168 hours.  Sepsis Markers  Recent Labs Lab 10/19/16 0631 10/19/16 0942 10/19/16 0947 10/19/16 1312  LATICACIDVEN 0.99  --  1.5 1.3  PROCALCITON  --  <0.10  --   --     ABG No results for input(s): PHART, PCO2ART, PO2ART in the last 168 hours.  Liver Enzymes  Recent Labs Lab 10/21/16 0413  AST 44*  ALT 66*  ALKPHOS 104  BILITOT 0.4  ALBUMIN 3.0*    Cardiac Enzymes  Recent Labs Lab 10/19/16 0942 10/19/16 1403 10/19/16 2022  TROPONINI <0.03 <0.03 <0.03    Glucose  Recent Labs Lab 10/20/16 0758 10/20/16 1128 10/20/16 1609 10/20/16 2000 10/20/16 2339 10/21/16 0734  GLUCAP 203* 206* 171* 171* 168* 142*    Imaging Dg Chest Port 1 View  Result Date: 10/21/2016 CLINICAL DATA:  Respiratory failure. EXAM: PORTABLE CHEST 1  VIEW COMPARISON:  10/20/2016. FINDINGS: Left IJ line in stable position. Heart size normal. Mild bibasilar subsegmental atelectasis. Small left pleural effusion cannot be excluded. No pneumothorax. IMPRESSION: 1. Left IJ line stable position. 2. Mild bibasilar atelectasis. Small left pleural effusion cannot be excluded. Electronically Signed   By: Marcello Moores  Register   On: 10/21/2016 06:21     STUDIES:  Echocardiogram 7/1>>> 65-70% Chest x-ray in the ER was normal  CULTURES: Blood sputum 6/30>>> Urine 6/30>>>  ANTIBIOTICS: Ceftriaxone 7/1>>>  LINES/TUBES: 7/1 left IJ>>>  DISCUSSION: This is a 42 year old female with recent history of gastric bypass 3 months ago coming with symptomatic hypotension and near syncope. Patient has had this similar episode 4 and 8 years ago. So far her workup has been unrevealing except for "microvascular heart disease" based on stress MRI   ASSESSMENT / PLAN:  PULMONARY A: OSA P:   Nocturnal cpap cvp up, lower fluids, pos 9 liters for 48 hours  CARDIOVASCULAR A:  Near syncope Hypovolemia Slight rel AI ( does NOT explain this degree fo shock) Cardiac microvascular disease Mild difference in cuff arms P:  cvp 17, kvo Echo hyperdynamic goes with microvascular heart disease dx Consider carotid  US, concern bruit and un equal cuff Goal sys 85, map 55 with good mental status Cards evaluation done this am  She did not want aline, holding off  RENAL A:   Acute kidney injury, slight ATN improved hyponatremia P:   cvp adaquate kvo Bladder scan in afternoon Assess UA, urine am osm bnp  GASTROINTESTINAL A:   Status post gastric bypass P:   Multivitamin supplementation Diet  HEMATOLOGIC A:   DVT prophylaxis P:  Heparin subcutaneous No ambulation as of yet  INFECTIOUS A:   R/o infection P:   Repeat pct for NPV Empiric ctx with this kinda of pressor need for now  ENDOCRINE  ICU glycemic scale Rel AI presumed as was provided   -mild, steroids keep  - maintain florinef  NEUROLOGIC A:   Patient has no symptoms when laying flat. P:   Continue to monitor May need tilt table as outpt  FAMILY  - Updates: Discussed with patient   Ccm time 30 min   Lavon Paganini. Titus Mould, MD, [place] Pgr: [facility]

## 2016-10-21 NOTE — Progress Notes (Signed)
VASCULAR LAB PRELIMINARY  PRELIMINARY  PRELIMINARY  PRELIMINARY  Right carotid duplex completed.    Preliminary report:  No hemodynamically significant right ICA stenosis.  Right vertebral artery flow is antegrade.  Unable to insonate left ICA secondary to central line.  Adelard Sanon, RVT 10/21/2016, 6:44 PM

## 2016-10-22 DIAGNOSIS — J9811 Atelectasis: Secondary | ICD-10-CM

## 2016-10-22 LAB — CBC WITH DIFFERENTIAL/PLATELET
BASOS ABS: 0 10*3/uL (ref 0.0–0.1)
Basophils Relative: 0 %
Eosinophils Absolute: 0 10*3/uL (ref 0.0–0.7)
Eosinophils Relative: 0 %
HEMATOCRIT: 34.2 % — AB (ref 36.0–46.0)
Hemoglobin: 11.1 g/dL — ABNORMAL LOW (ref 12.0–15.0)
LYMPHS ABS: 1.4 10*3/uL (ref 0.7–4.0)
LYMPHS PCT: 15 %
MCH: 26.9 pg (ref 26.0–34.0)
MCHC: 32.5 g/dL (ref 30.0–36.0)
MCV: 82.8 fL (ref 78.0–100.0)
MONO ABS: 0.8 10*3/uL (ref 0.1–1.0)
Monocytes Relative: 8 %
NEUTROS ABS: 7.1 10*3/uL (ref 1.7–7.7)
Neutrophils Relative %: 77 %
Platelets: 165 10*3/uL (ref 150–400)
RBC: 4.13 MIL/uL (ref 3.87–5.11)
RDW: 14.7 % (ref 11.5–15.5)
WBC: 9.3 10*3/uL (ref 4.0–10.5)

## 2016-10-22 LAB — SODIUM, URINE, RANDOM: Sodium, Ur: 49 mmol/L

## 2016-10-22 LAB — PROCALCITONIN: Procalcitonin: <0.1 ng/mL

## 2016-10-22 LAB — COMPREHENSIVE METABOLIC PANEL
ALBUMIN: 2.7 g/dL — AB (ref 3.5–5.0)
ALT: 59 U/L — ABNORMAL HIGH (ref 14–54)
ANION GAP: 6 (ref 5–15)
AST: 32 U/L (ref 15–41)
Alkaline Phosphatase: 100 U/L (ref 38–126)
BILIRUBIN TOTAL: 0.3 mg/dL (ref 0.3–1.2)
BUN: 12 mg/dL (ref 6–20)
CHLORIDE: 106 mmol/L (ref 101–111)
CO2: 27 mmol/L (ref 22–32)
Calcium: 8.8 mg/dL — ABNORMAL LOW (ref 8.9–10.3)
Creatinine, Ser: 0.71 mg/dL (ref 0.44–1.00)
GFR calc Af Amer: >60 mL/min (ref >60)
GFR calc non Af Amer: >60 mL/min (ref >60)
GLUCOSE: 107 mg/dL — AB (ref 65–99)
POTASSIUM: 3.6 mmol/L (ref 3.5–5.1)
SODIUM: 139 mmol/L (ref 135–145)
TOTAL PROTEIN: 4.9 g/dL — AB (ref 6.5–8.1)

## 2016-10-22 LAB — VAS US CAROTID
RCCADSYS: -98 cm/s
RIGHT ECA DIAS: -16 cm/s
RIGHT VERTEBRAL DIAS: 11 cm/s
Right CCA prox dias: 13 cm/s
Right CCA prox sys: 126 cm/s

## 2016-10-22 LAB — GLUCOSE, CAPILLARY
GLUCOSE-CAPILLARY: 104 mg/dL — AB (ref 65–99)
GLUCOSE-CAPILLARY: 112 mg/dL — AB (ref 65–99)
Glucose-Capillary: 140 mg/dL — ABNORMAL HIGH (ref 65–99)
Glucose-Capillary: 88 mg/dL (ref 65–99)
Glucose-Capillary: 99 mg/dL (ref 65–99)
Glucose-Capillary: 99 mg/dL (ref 65–99)

## 2016-10-22 MED ORDER — BARIATRIC MULTIVITAMINS/IRON PO CAPS
1.0000 | ORAL_CAPSULE | Freq: Two times a day (BID) | ORAL | Status: DC
  Filled 2016-10-22 (×2): qty 1

## 2016-10-22 MED ORDER — POTASSIUM CHLORIDE CRYS ER 20 MEQ PO TBCR
20.0000 meq | EXTENDED_RELEASE_TABLET | ORAL | Status: AC
  Administered 2016-10-22 (×2): 20 meq via ORAL
  Filled 2016-10-22 (×2): qty 1

## 2016-10-22 MED ORDER — PANTOPRAZOLE SODIUM 40 MG PO TBEC
40.0000 mg | DELAYED_RELEASE_TABLET | Freq: Every day | ORAL | Status: DC
  Administered 2016-10-22 – 2016-10-24 (×3): 40 mg via ORAL
  Filled 2016-10-22 (×3): qty 1

## 2016-10-22 MED ORDER — ONDANSETRON 4 MG PO TBDP
4.0000 mg | ORAL_TABLET | Freq: Four times a day (QID) | ORAL | Status: DC | PRN
  Filled 2016-10-22: qty 1

## 2016-10-22 MED ORDER — BARIATRIC MULTIVITAMINS/IRON PO CAPS
1.0000 | ORAL_CAPSULE | Freq: Two times a day (BID) | ORAL | Status: DC
  Administered 2016-10-22 – 2016-10-24 (×4): 1 via ORAL
  Filled 2016-10-22 (×3): qty 1

## 2016-10-22 NOTE — Progress Notes (Signed)
RT instructed pt on use of incentive spirometer.  Pt able to reach 1000 mL using IS.

## 2016-10-22 NOTE — Progress Notes (Signed)
Patient trasfered from 78M to 5W31 via bed; alert and oriented x 4; no complaints of pain; IV saline locked in LAC. Orient patient to room and unit; instructed how to use the call bell and  fall risk precautions. Will continue to monitor the patient.

## 2016-10-22 NOTE — Progress Notes (Signed)
PULMONARY / CRITICAL CARE MEDICINE   Name: Jennifer Mayo MRN: [id number] DOB: [birth year]    ADMISSION DATE:  10/19/2016  CHIEF COMPLAINT:  hypotension  HISTORY OF PRESENT ILLNESS:   41yo F hx gastric bypass presenting with hypotension and some brief chest pain radiating to the L arm.  No associated symptoms. Hx cardiac cath 8 years ago, which was normal.  Similar episode s/p gastric bypass and had had workup with negative CT for PE.   SUBJECTIVE:  Overnight events: off pressors, in chair  VITAL SIGNS: BP (!) 76/43   Pulse 69   Temp 98 F (36.7 C)   Resp 18   Ht 5\' 6"  (1.676 m)   Wt 263 lb 10.7 oz (119.6 kg)   SpO2 95%   BMI 42.56 kg/m   HEMODYNAMICS: CVP:  [9 mmHg-17 mmHg] 9 mmHg  VENTILATOR SETTINGS:    INTAKE / OUTPUT: I/O last 3 completed shifts: In: 2515.9 [I.V.:2365.9; IV Piggyback:150] Out: 9509 [Urine:3515]  PHYSICAL EXAMINATION: General:  Pleasant 41yo F, awake and alert Neuro:  No FND HEENT:  JVD down Cardiovascular:  RRR, s1/s2 present, no mrg Lungs:  Clear lungs Abdomen:  Soft, NTND Musculoskeletal:  No gross deformities Ext: no edema  LABS:  BMET  Recent Labs Lab 10/20/16 0203 10/21/16 0413 10/22/16 0350  NA 133* 129* 139  K 4.0 3.6 3.6  CL 107 102 106  CO2 19* 24 27  BUN 16 18 12   CREATININE 1.35* 0.77 0.71  GLUCOSE 237* 161* 107*    Electrolytes  Recent Labs Lab 10/20/16 0203 10/21/16 0413 10/22/16 0350  CALCIUM 8.5* 8.4* 8.8*  MG 1.9  --   --   PHOS 2.6  --   --     CBC  Recent Labs Lab 10/20/16 0203 10/21/16 0413 10/22/16 0350  WBC 12.3* 13.7* 9.3  HGB 12.7 11.7* 11.1*  HCT 39.2 36.2 34.2*  PLT 289 223 165    Coag's No results for input(s): APTT, INR in the last 168 hours.  Sepsis Markers  Recent Labs Lab 10/19/16 0631 10/19/16 0942 10/19/16 0947 10/19/16 1312 10/21/16 0951  LATICACIDVEN 0.99  --  1.5 1.3  --   PROCALCITON  --  <0.10  --   --  <0.10    ABG No results for input(s): PHART, PCO2ART,  PO2ART in the last 168 hours.  Liver Enzymes  Recent Labs Lab 10/21/16 0413 10/22/16 0350  AST 44* 32  ALT 66* 59*  ALKPHOS 104 100  BILITOT 0.4 0.3  ALBUMIN 3.0* 2.7*    Cardiac Enzymes  Recent Labs Lab 10/19/16 0942 10/19/16 1403 10/19/16 2022  TROPONINI <0.03 <0.03 <0.03    Glucose  Recent Labs Lab 10/21/16 0734 10/21/16 1210 10/21/16 1600 10/21/16 1953 10/22/16 0048 10/22/16 0351  GLUCAP 142* 129* 112* 115* 99 104*    Imaging Dg Chest Port 1 View  Result Date: 10/21/2016 CLINICAL DATA:  Pulmonary edema EXAM: PORTABLE CHEST 1 VIEW COMPARISON:  Earlier today FINDINGS: Borderline cardiomegaly. Increased more focal opacity at the medial right base. Mild medial left basilar streaky opacity. No Kerley lines, effusion, or pneumothorax. Left IJ catheter with tip directed into the azygos. IMPRESSION: 1. Progressive opacification at the medial right base which could be pneumonia or atelectasis 2. Negative for pulmonary edema. 3. Left IJ catheter with tip directed into the azygos. Electronically Signed   By: Monte Fantasia M.D.   On: 10/21/2016 12:58     STUDIES:  Echo 7/1 showing EF 65-70%, mild LVH R carotid  duplex: no hemodynamically significant ICA stenosis L carotid duplex: unable to perform 2/2 CVC LE doppler: no DVT or SVT   CULTURES: Bcx 6/30: NGTD x2 days Ucx: multiple species Sputum: collected  ANTIBIOTICS: Ceftriaxone 7/1>>  SIGNIFICANT EVENTS: Started on ceftriaxone 7/1 Stopped pressors 7/2  LINES/TUBES: CVC 7/1: LIJ PIV: 6/30:  left antecubital Urethral cath: 7/2  DISCUSSION: 40yo F with hx gastric bypass 3 mo ago admitted for symptomatic hypotension now off pressors. Seen by Cardiology yesterday who recommended rapid wean off pressors if MAP >50-60.  MAP 54 this AM and asymptomatic  ASSESSMENT / PLAN:  PULMONARY A: OSA P:   Nocturnal cpap. Satting 95% on 5LNC  CARDIOVASCULAR A:  Near syncope, hypovolemia, microvascular angina  pectoris, hx of hypertension, currently hypotensive P:  Continue off pressors if MAP >50-60 and asymptomatic. Continue midodrine and florinef   RENAL A:  AKI, improved. Creatine to 0.7 this AM. UOP appropriate. UA with elevated spec grav, otherwise WNL. BMP WNL.   P:   KVO, monitor output, AM BMP.   GASTROINTESTINAL A:   S/p gastric bypass P:   GI ppx with pepcid, diet  HEMATOLOGIC A:   DVT ppx P:  Subcutaneous heparin  INFECTIOUS A:   Currently on ceftriaxone. No obvious signs/symptoms of infection at this time P:   Stop ceftriaxone and follow up cultures  ENDOCRINE A:   ICU glycemic scale, presumed adrenal insufficiency.  P:   Continue steroids and florinef, monitor CBGs  NEUROLOGIC A:   Asymptomatic while lying flat P:   Continue to monitor Consider tilt table as outpatient   FAMILY  - Updates: Discussed with patient  Pulmonary and [place] Pager: [phone]  10/22/2016, 7:21 AM   STAFF NOTE: I, Merrie Roof, MD FACP have personally reviewed patient's available data, including medical history, events of note, physical examination and test results as part of my evaluation. I have discussed with resident/NP and other care providers such as pharmacist, RN and RRT. In addition, I personally evaluated patient and elicited key findings of: awake, in chair, no distress, ronchi rt base mild coarse, line clean, jvd up, pcxr which I reviewed revealed ATX rt base, she has come off pressors well, would repeat pcxr in am on IS, ambulate ASAP ( caution), IS should be hourly when awake, PCT neg, no fevers ever during stay, would dc abx and follow above pcxr and clinical status, she is off all pressors with excellent BP, we accepting lower map goals and sys goals, echo noted, cards following, keep tele, she has iatrogenic urine output, would urine na, dc foley, dc line, diet advanced good, LF t wnl, consider sending her out on florinef  to home, I updated pt in full, ist not clear what starts this hypovolemic stat , she is very sensitive to [employer]. Titus Mould, MD, [place] Pgr: [facility] 10/22/2016 10:13 AM

## 2016-10-22 NOTE — Progress Notes (Signed)
[facility] ADULT ICU REPLACEMENT PROTOCOL FOR AM LAB REPLACEMENT ONLY  The patient does apply for the [facility] Adult ICU Electrolyte Replacment Protocol based on the criteria listed below:   1. Is GFR >/= 40 ml/min? Yes.    Patient's GFR today is >60 2. Is urine output >/= 0.5 ml/kg/hr for the last 6 hours? Yes.   Patient's UOP is 0.5 ml/kg/hr 3. Is BUN < 60 mg/dL? Yes.    Patient's BUN today is 12 4. Abnormal electrolyte  K 3.6 5. Ordered repletion with: per protocol 6. If a panic level lab has been reported, has the CCM MD in charge been notified? Yes.  .   Physician:  Philbert Riser 10/22/2016 4:57 AM

## 2016-10-23 ENCOUNTER — Inpatient Hospital Stay (HOSPITAL_COMMUNITY): Payer: 59

## 2016-10-23 DIAGNOSIS — E2740 Unspecified adrenocortical insufficiency: Secondary | ICD-10-CM

## 2016-10-23 DIAGNOSIS — I959 Hypotension, unspecified: Secondary | ICD-10-CM

## 2016-10-23 HISTORY — DX: Unspecified adrenocortical insufficiency: E27.40

## 2016-10-23 LAB — BASIC METABOLIC PANEL
ANION GAP: 8 (ref 5–15)
BUN: 11 mg/dL (ref 6–20)
CHLORIDE: 103 mmol/L (ref 101–111)
CO2: 29 mmol/L (ref 22–32)
Calcium: 8.8 mg/dL — ABNORMAL LOW (ref 8.9–10.3)
Creatinine, Ser: 0.71 mg/dL (ref 0.44–1.00)
GFR calc Af Amer: >60 mL/min (ref >60)
GFR calc non Af Amer: >60 mL/min (ref >60)
GLUCOSE: 101 mg/dL — AB (ref 65–99)
POTASSIUM: 3.3 mmol/L — AB (ref 3.5–5.1)
Sodium: 140 mmol/L (ref 135–145)

## 2016-10-23 LAB — GLUCOSE, CAPILLARY
GLUCOSE-CAPILLARY: 103 mg/dL — AB (ref 65–99)
GLUCOSE-CAPILLARY: 92 mg/dL (ref 65–99)
GLUCOSE-CAPILLARY: 152 mg/dL — AB (ref 65–99)
Glucose-Capillary: 102 mg/dL — ABNORMAL HIGH (ref 65–99)
Glucose-Capillary: 99 mg/dL (ref 65–99)
Glucose-Capillary: 99 mg/dL (ref 65–99)

## 2016-10-23 LAB — PROCALCITONIN

## 2016-10-23 MED ORDER — POTASSIUM CHLORIDE CRYS ER 20 MEQ PO TBCR
20.0000 meq | EXTENDED_RELEASE_TABLET | Freq: Once | ORAL | Status: AC
  Administered 2016-10-23: 20 meq via ORAL
  Filled 2016-10-23: qty 1

## 2016-10-23 MED ORDER — POTASSIUM CHLORIDE 10 MEQ/100ML IV SOLN: 10.0000 meq | INTRAVENOUS | Status: DC

## 2016-10-23 MED ORDER — HYDROCORTISONE 10 MG PO TABS
10.0000 mg | ORAL_TABLET | Freq: Every evening | ORAL | Status: DC
  Administered 2016-10-23: 10 mg via ORAL
  Filled 2016-10-23: qty 1

## 2016-10-23 MED ORDER — HYDROCORTISONE 5 MG PO TABS
15.0000 mg | ORAL_TABLET | Freq: Every day | ORAL | Status: DC
  Administered 2016-10-24: 15 mg via ORAL
  Filled 2016-10-23: qty 1

## 2016-10-23 NOTE — Progress Notes (Signed)
Patient ID: Jennifer Mayo, female   DOB: [birth year], 41 y.o.   MRN: [id number]                                                                PROGRESS NOTE                                                                                                                                                                                                             Patient Demographics:    Jennifer Mayo, is a 41 y.o. female, DOB - April 08, 1976, TIR:[id number]  Admit date - 10/19/2016   Admitting Physician Chesley Mires, MD  Outpatient Primary MD for the patient is Raelyn Number, MD  LOS - 4  Outpatient Specialists:     Chief Complaint  Patient presents with  . Near Syncope  . Dizziness       Brief Narrative  41yo F hx gastric bypass presenting with hypotension and some brief chest pain radiating to the L arm.  No associated symptoms. Hx cardiac cath 8 years ago, which was normal.  Similar episode s/p gastric bypass and had had workup with negative CT for PE.    STUDIES:  Echo 7/1 showing EF 65-70%, mild LVH R carotid duplex: no hemodynamically significant ICA stenosis L carotid duplex: unable to perform 2/2 CVC LE doppler: no DVT or SVT   CULTURES: Bcx 6/30: NGTD x2 days Ucx: multiple species Sputum: collected  ANTIBIOTICS: Ceftriaxone 7/1>>  SIGNIFICANT EVENTS: Started on ceftriaxone 7/1 Stopped pressors 7/2  LINES/TUBES: CVC 7/1: LIJ PIV: 6/30:  left antecubital Urethral cath: 7/2  Subjective:    [name] today feels better. No dizziness.  No syncope.  No headache, No chest pain, No abdominal pain - No Nausea, No new weakness tingling or numbness, No Cough - SOB.    Assessment  & Plan :    Principal Problem:   Hypotension Active Problems:   Vitamin B 12 deficiency   Obstructive sleep apnea   Morbid obesity due to excess calories (HCC)   Near syncope   Acute respiratory failure (HCC)   Encounter for central line placement   Atelectasis   Near syncope, hypovolemia,  microvascular angina pectoris, hx of hypertension, currently hypotensive Continue off pressors if MAP >50-60 and asymptomatic. Continue midodrine and florinef  Adrenal  insufficiency D/c solumedrol Start hydrocortisone 15mg  po qam and 10mg  po qpm  Hypokalemia replete  OSA Nocturnal cpap. Satting 95% on 5LNC  AKI, improved. Creatine to 0.7 this AM. UOP appropriate. UA with elevated spec grav, otherwise WNL. BMP WNL.    S/p gastric bypass GI ppx with pepcid, diet  Currently on ceftriaxone. No obvious signs/symptoms of infection at this time Off ceftriaxone, cultures negative  ICU glycemic scale, presumed adrenal insufficiency.  conntinue steroids and florinef, monitor CBGs   Asymptomatic while lying flat Continue to monitor Consider tilt table as outpatient    Code Status : FULL CODE  Family Communication  : w patient  Disposition Plan  : home  Barriers For Discharge :   Consults  :  Cardiology, pccm  Procedures  :  ACTH stim test 7/1  DVT Prophylaxis  :  Heparin - SCDs   Lab Results  Component Value Date   PLT 165 10/22/2016      Anti-infectives    Start     Dose/Rate Route Frequency Ordered Stop   10/20/16 0900  cefTRIAXone (ROCEPHIN) 1 g in dextrose 5 % 50 mL IVPB  Status:  Discontinued     1 g 100 mL/hr over 30 Minutes Intravenous Every 24 hours 10/20/16 0837 10/22/16 1017        Objective:   Vitals:   10/22/16 1600 10/22/16 1642 10/22/16 2140 10/23/16 0535  BP:  108/69 110/62 117/67  Pulse: 91 76 66 (!) 54  Resp: 20  18 18   Temp:  97.8 F (36.6 C) 98.5 F (36.9 C) 98.2 F (36.8 C)  TempSrc:  Oral  Oral  SpO2: 99% 95% 96% 92%  Weight:  113.9 kg (251 lb 3.2 oz)  112.5 kg (248 lb 0.3 oz)  Height:  5\' 6"  (1.676 m)      Wt Readings from Last 3 Encounters:  10/23/16 112.5 kg (248 lb 0.3 oz)  07/18/16 132.6 kg (292 lb 6.4 oz)  07/08/16 (!) 138.2 kg (304 lb 11.2 oz)     Intake/Output Summary (Last 24 hours) at 10/23/16 1233 Last  data filed at 10/23/16 0549  Gross per 24 hour  Intake              340 ml  Output             1375 ml  Net            -1035 ml     Physical Exam  Awake Alert, Oriented X 3, No new F.N deficits, Normal affect [place].AT,PERRAL Supple Neck,No JVD, No cervical lymphadenopathy appriciated.  Symmetrical Chest wall movement, Good air movement bilaterally, CTAB RRR,No Gallops,Rubs or new Murmurs, No Parasternal Heave +ve B.Sounds, Abd Soft, No tenderness, No organomegaly appriciated, No rebound - guarding or rigidity. No Cyanosis, Clubbing or edema, No new Rash or bruise      Data Review:    CBC  Recent Labs Lab 10/19/16 0619 10/19/16 0942 10/20/16 0203 10/21/16 0413 10/22/16 0350  WBC 8.6 8.2 12.3* 13.7* 9.3  HGB 13.1 13.0 12.7 11.7* 11.1*  HCT 41.6 41.0 39.2 36.2 34.2*  PLT 214 217 289 223 165  MCV 83.9 85.4 83.8 81.5 82.8  MCH 26.4 27.1 27.1 26.4 26.9  MCHC 31.5 31.7 32.4 32.3 32.5  RDW 14.7 15.0 14.0 14.0 14.7  LYMPHSABS 1.9  --   --  1.9 1.4  MONOABS 0.7  --   --  1.0 0.8  EOSABS 0.1  --   --  0.0  0.0  BASOSABS 0.0  --   --  0.0 0.0    Chemistries   Recent Labs Lab 10/19/16 0619 10/19/16 0942 10/20/16 0203 10/21/16 0413 10/22/16 0350 10/23/16 0557  NA 137  --  133* 129* 139 140  K 3.9  --  4.0 3.6 3.6 3.3*  CL 104  --  107 102 106 103  CO2 25  --  19* 24 27 29   GLUCOSE 120*  --  237* 161* 107* 101*  BUN 16  --  16 18 12 11   CREATININE 1.22* 1.13* 1.35* 0.77 0.71 0.71  CALCIUM 8.9  --  8.5* 8.4* 8.8* 8.8*  MG  --   --  1.9  --   --   --   AST  --   --   --  44* 32  --   ALT  --   --   --  66* 59*  --   ALKPHOS  --   --   --  104 100  --   BILITOT  --   --   --  0.4 0.3  --    ------------------------------------------------------------------------------------------------------------------ No results for input(s): CHOL, HDL, LDLCALC, TRIG, CHOLHDL, LDLDIRECT in the last 72 hours.  No results found for:  HGBA1C ------------------------------------------------------------------------------------------------------------------ No results for input(s): TSH, T4TOTAL, T3FREE, THYROIDAB in the last 72 hours.  Invalid input(s): FREET3 ------------------------------------------------------------------------------------------------------------------ No results for input(s): VITAMINB12, FOLATE, FERRITIN, TIBC, IRON, RETICCTPCT in the last 72 hours.  Coagulation profile No results for input(s): INR, PROTIME in the last 168 hours.  No results for input(s): DDIMER in the last 72 hours.  Cardiac Enzymes  Recent Labs Lab 10/19/16 0942 10/19/16 1403 10/19/16 2022  TROPONINI <0.03 <0.03 <0.03   ------------------------------------------------------------------------------------------------------------------    Component Value Date/Time   BNP 672.9 (H) 10/21/2016 1325    Inpatient Medications  Scheduled Meds: . BARIATRIC MULTIVITAMINS/IRON  1 tablet Oral BID  . B-complex with vitamin C  1 tablet Oral Daily  . calcium-vitamin D  1 tablet Oral TID  . Chlorhexidine Gluconate Cloth  6 each Topical Daily  . fludrocortisone  0.1 mg Oral Daily  . heparin  5,000 Units Subcutaneous Q8H  . hydrocortisone sod succinate (SOLU-CORTEF) inj  50 mg Intravenous Q6H  . insulin aspart  2-6 Units Subcutaneous Q4H  . lisdexamfetamine  50 mg Oral Daily  . mouth rinse  15 mL Mouth Rinse BID  . midodrine  10 mg Oral TID WC  . pantoprazole  40 mg Oral Daily  . protein supplement shake  2 oz Oral BID BM  . sodium chloride flush  10-40 mL Intracatheter Q12H   Continuous Infusions: . sodium chloride 250 mL (10/22/16 0000)  . potassium chloride     PRN Meds:.sodium chloride, acetaminophen **OR** acetaminophen (TYLENOL) oral liquid 160 mg/5 mL, albuterol, docusate, ondansetron, sodium chloride flush  Micro Results Recent Results (from the past 240 hour(s))  Urine culture     Status: Abnormal   Collection Time:  10/19/16  8:03 AM  Result Value Ref Range Status   Specimen Description URINE, CLEAN CATCH  Final   Special Requests NONE  Final   Culture MULTIPLE SPECIES PRESENT, SUGGEST RECOLLECTION (A)  Final   Report Status 10/20/2016 FINAL  Final  Culture, blood (routine x 2)     Status: None (Preliminary result)   Collection Time: 10/19/16  9:45 AM  Result Value Ref Range Status   Specimen Description BLOOD RIGHT ANTECUBITAL  Final   Special Requests   Final  BOTTLES DRAWN AEROBIC AND ANAEROBIC Blood Culture adequate volume   Culture NO GROWTH 4 DAYS  Final   Report Status PENDING  Incomplete  Culture, blood (routine x 2)     Status: None (Preliminary result)   Collection Time: 10/19/16  9:55 AM  Result Value Ref Range Status   Specimen Description BLOOD RIGHT HAND  Final   Special Requests   Final    BOTTLES DRAWN AEROBIC AND ANAEROBIC Blood Culture adequate volume   Culture NO GROWTH 4 DAYS  Final   Report Status PENDING  Incomplete  MRSA PCR Screening     Status: None   Collection Time: 10/19/16 10:53 AM  Result Value Ref Range Status   MRSA by PCR NEGATIVE NEGATIVE Final    Comment:        The GeneXpert MRSA Assay (FDA approved for NASAL specimens only), is one component of a comprehensive MRSA colonization surveillance program. It is not intended to diagnose MRSA infection nor to guide or monitor treatment for MRSA infections.     Radiology Reports Dg Chest 2 View  Result Date: 10/23/2016 CLINICAL DATA:  Shortness of breath. Possibly due to fluid overload. EXAM: CHEST  2 VIEW COMPARISON:  10/21/2016 FINDINGS: Improved aeration at the medial right lung base. There are hazy densities along the posterior lower chest. Upper lungs are clear. Heart and mediastinum are within normal limits. The trachea is midline. Negative for a pneumothorax. IMPRESSION: Improved aeration at the right lung base but there are mild densities along the posterior lower chest. Findings could represent  dependent edema or small amount of airspace disease. Electronically Signed   By: Markus Daft M.D.   On: 10/23/2016 09:19   Dg Chest Port 1 View  Result Date: 10/21/2016 CLINICAL DATA:  Pulmonary edema EXAM: PORTABLE CHEST 1 VIEW COMPARISON:  Earlier today FINDINGS: Borderline cardiomegaly. Increased more focal opacity at the medial right base. Mild medial left basilar streaky opacity. No Kerley lines, effusion, or pneumothorax. Left IJ catheter with tip directed into the azygos. IMPRESSION: 1. Progressive opacification at the medial right base which could be pneumonia or atelectasis 2. Negative for pulmonary edema. 3. Left IJ catheter with tip directed into the azygos. Electronically Signed   By: Monte Fantasia M.D.   On: 10/21/2016 12:58   Dg Chest Port 1 View  Result Date: 10/21/2016 CLINICAL DATA:  Respiratory failure. EXAM: PORTABLE CHEST 1 VIEW COMPARISON:  10/20/2016. FINDINGS: Left IJ line in stable position. Heart size normal. Mild bibasilar subsegmental atelectasis. Small left pleural effusion cannot be excluded. No pneumothorax. IMPRESSION: 1. Left IJ line stable position. 2. Mild bibasilar atelectasis. Small left pleural effusion cannot be excluded. Electronically Signed   By: Marcello Moores  Register   On: 10/21/2016 06:21   Dg Chest Port 1 View  Result Date: 10/20/2016 CLINICAL DATA:  Central line placement EXAM: PORTABLE CHEST 1 VIEW COMPARISON:  Earlier same day FINDINGS: Left internal jugular central line has its tip either in the SVC at the azygos level or within the azygos vein itself. No pneumothorax. Minimal left base atelectasis. IMPRESSION: Left internal jugular central line tip either in the SVC at the azygos level or in the azygos vein. No pneumothorax. Electronically Signed   By: Nelson Chimes M.D.   On: 10/20/2016 10:50   Dg Chest Port 1 View  Result Date: 10/20/2016 CLINICAL DATA:  Shortness of breath.  Morbid obesity. EXAM: PORTABLE CHEST 1 VIEW COMPARISON:  10/19/2016 FINDINGS: The  heart size and mediastinal contours are within  normal limits. Low lung volumes are noted, however both lungs are clear. No evidence of pneumothorax or pleural effusion. IMPRESSION: Low lung volumes.  No active disease. Electronically Signed   By: Earle Gell M.D.   On: 10/20/2016 10:21   Dg Chest Port 1 View  Result Date: 10/19/2016 CLINICAL DATA:  Dizziness over the last 2 days.  Syncopal episode. EXAM: PORTABLE CHEST 1 VIEW COMPARISON:  None. FINDINGS: The heart size and mediastinal contours are within normal limits. Both lungs are clear. The visualized skeletal structures are unremarkable. IMPRESSION: No active disease. Electronically Signed   By: Nelson Chimes M.D.   On: 10/19/2016 06:54    Time Spent in minutes  30   Jani Gravel M.D on 10/23/2016 at 12:33 PM  Between 7am to 7pm - Pager - [phone]  After 7pm go to [email or web address] - password [facility]  [facility] -  Office  [phone]

## 2016-10-23 NOTE — Progress Notes (Signed)
Pt. has home CPAP unit in room with order placed for use, made aware to call if help needed, RT to monitor.

## 2016-10-24 LAB — GLUCOSE, CAPILLARY
GLUCOSE-CAPILLARY: 96 mg/dL (ref 65–99)
Glucose-Capillary: 78 mg/dL (ref 65–99)
Glucose-Capillary: 88 mg/dL (ref 65–99)

## 2016-10-24 LAB — CULTURE, BLOOD (ROUTINE X 2)
CULTURE: NO GROWTH
Culture: NO GROWTH
SPECIAL REQUESTS: ADEQUATE
Special Requests: ADEQUATE

## 2016-10-24 LAB — BASIC METABOLIC PANEL
Anion gap: 9 (ref 5–15)
BUN: 13 mg/dL (ref 6–20)
CALCIUM: 9 mg/dL (ref 8.9–10.3)
CO2: 30 mmol/L (ref 22–32)
CREATININE: 0.69 mg/dL (ref 0.44–1.00)
Chloride: 103 mmol/L (ref 101–111)
GFR calc Af Amer: >60 mL/min (ref >60)
GFR calc non Af Amer: >60 mL/min (ref >60)
GLUCOSE: 84 mg/dL (ref 65–99)
Potassium: 2.8 mmol/L — ABNORMAL LOW (ref 3.5–5.1)
Sodium: 142 mmol/L (ref 135–145)

## 2016-10-24 LAB — MAGNESIUM: Magnesium: 2.2 mg/dL (ref 1.7–2.4)

## 2016-10-24 LAB — TSH: TSH: 11.791 u[IU]/mL — ABNORMAL HIGH (ref 0.350–4.500)

## 2016-10-24 MED ORDER — MIDODRINE HCL 10 MG PO TABS: 10.0000 mg | ORAL_TABLET | Freq: Three times a day (TID) | ORAL | 0 refills | Status: DC

## 2016-10-24 MED ORDER — HYDROCORTISONE 5 MG PO TABS: 15.0000 mg | ORAL_TABLET | Freq: Every day | ORAL | 0 refills | Status: DC

## 2016-10-24 MED ORDER — HYDROCORTISONE 10 MG PO TABS: 10.0000 mg | ORAL_TABLET | Freq: Every evening | ORAL | 0 refills | Status: DC

## 2016-10-24 NOTE — Progress Notes (Signed)
Pt reading sinus brady on monitor in 40s. Denied chest pain and SOB, other VS stable. On 10/23/16 potassium 3.3, no new orders for labs this morning MD notified, new orders for labs placed.

## 2016-10-24 NOTE — Progress Notes (Signed)
Danae Chen Pfost to be D/C'd Home per MD order.  Discussed with the patient and all questions fully answered.  VSS, Skin clean, dry and intact without evidence of skin break down, no evidence of skin tears noted. IV catheter discontinued intact. Site without signs and symptoms of complications. Dressing and pressure applied.  An After Visit Summary was printed and given to the patient. Patient received prescription.  D/c education completed with patient/family including follow up instructions, medication list, d/c activities limitations if indicated, with other d/c instructions as indicated by MD - patient able to verbalize understanding, all questions fully answered.   Patient instructed to return to ED, call 911, or call MD for any changes in condition.   Patient escorted via [place], and D/C home via private auto.  Dorris Carnes 10/24/2016 9:53 AM

## 2016-10-24 NOTE — Discharge Summary (Signed)
Jennifer Mayo, is a 41 y.o. female  DOB Sep 30, 1975  MRN [id number].  Admission date:  10/19/2016  Admitting Physician  Chesley Mires, MD  Discharge Date:  10/24/2016   Primary MD  Raelyn Number, MD  Recommendations for primary care physician for things to follow:    Near syncope, hypovolemia, microvascular angina pectoris, hx of hypertension, Hydrocortisone 15mg  po qam and 10mg  po qpm Midodrine 10mg  po tid, can try to taper off Please monitor sugar closely since starting hydrocortisone  Adrenal insufficiency hydrocortisone 15mg  po qam and 10mg  po qpm Please have see Dr. Chalmers Cater in 2 weeks-4 weeks.   Hypokalemia Check cmp in 1 week  OSA Cont Nocturnal cpap. Satting 95% on 5LNC  AKI, improved. Check cmp in 1 week  S/p gastric bypass GI ppx with pepcid, diet   Admission Diagnosis  Idiopathic hypotension [I95.0]   Discharge Diagnosis  Idiopathic hypotension [I95.0]     Principal Problem:   Hypotension Active Problems:   Vitamin B 12 deficiency   Obstructive sleep apnea   Morbid obesity due to excess calories ([place])   Near syncope   Acute respiratory failure ([place])   Encounter for central line placement   Atelectasis   Adrenal insufficiency ([place])      Past Medical History:  Diagnosis Date  . Anemia   . Cancer [facility])    endometrial  . Cardiac microvascular disease   . CHF (congestive heart failure) ([place])   . Depression   . History of endometrial cancer    had hysterectomy in August 2015  . Mild diastolic dysfunction   . Narcolepsy   . Obstructive sleep apnea   . Pleural effusion, bilateral    3-4 years   . Solitary pulmonary nodule on lung CT    follow up CT recommended per medical records  . Thyroid disease   . Vitamin B 12 deficiency     Past Surgical History:  Procedure Laterality Date  . ABDOMINAL HYSTERECTOMY    . CHOLECYSTECTOMY    . LAPAROSCOPIC ROUX-EN-Y  GASTRIC BYPASS WITH HIATAL HERNIA REPAIR N/A 07/01/2016   Procedure: LAPAROSCOPIC ROUX-EN-Y GASTRIC BYPASS WITH HIATAL HERNIA REPAIR, UPPER ENDO;  Surgeon: Arta Bruce Kinsinger, MD;  Location: [facility];  Service: General;  Laterality: N/A;  . UPPER GI ENDOSCOPY  07/01/2016   Procedure: UPPER GI ENDOSCOPY;  Surgeon: Arta Bruce Kinsinger, MD;  Location: [facility];  Service: General;;       HPI  from the history and physical done on the day of admission:     41 year old female with history of gastric bypass in March 2018 who presents with low blood pressure. She woke up today morning around 4 AM and try to walk up to the restroom but became weak and dizzy and lethargic. She kind of slumped down to the floor which was witnessed by her husband. She did not lose consciousness or bump her head. She experienced some associated chest pain for less than a minute in the left precordial area radiating to  the left arm. She said she experienced similar episodes for years ago and 8 years ago when she was admitted in [facility] in [place]. They moved to [facility] about 2 years ago.  Patient denies any shortness of breath fever chills nausea vomiting urinary or bowel complaints. No pedal edema. No sinus congestion. No urinary urgency frequency or diarrhea. She does feel cold all the time.  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 Patient also had similar episode a day after being discharged after her gastric bypass surgery in March 2018. At that time she had workup including CT chest which was negative for PE. Next  She has lost 60-70 pounds since her gastric bypass.  He has sleep apnea and is compliant with her CPAP. She is on medication for narcolepsy also  She has history of endometrial  cancer and underwent hysterectomy.     Hospital Course:        Pt was admitted for syncope, hypotension, and ARF.   She was treated with ns iv and briefly on levophed, and rocephin and  was seen by Adrian Prows from cardiology for chest pain and hypotension who recommended at Cosyntropin stim test cortisol at 60 min was 17.  Since cardiac echo on 7/1=> EF 65%, mild MR.  Carotid ultrasound 7/2=> negative Pt was placed on florinef and solucortef and midodrine.  Her bp improved.  solucortef discontinued and pt was placed on hydrocortisone and florinef discontinued as well.  Pt is feeling better at baseline.        STUDIES: Echo 7/1 showing EF 65-70%, mild LVH R carotid duplex: no hemodynamically significant ICA stenosis L carotid duplex: unable to perform 2/2 CVC LE doppler: no DVT or SVT  CTA negative for PE  CULTURES: Bcx 6/30: NGTD x2 days Ucx: multiple species Sputum: collected  ANTIBIOTICS: Ceftriaxone 7/1>>  SIGNIFICANT EVENTS: Started on ceftriaxone 7/1 Stopped pressors 7/2  LINES/TUBES: CVC 7/1: LIJ PIV: 6/30: left antecubital Urethral cath: 7/2Follow UP  Follow-up Information    Raelyn Number, MD Follow up in 1 week(s).   Specialty:  Internal Medicine Contact information: [address] [place] [address] [phone]        Jacelyn Pi, MD Follow up in 2 week(s).   Specialty:  Endocrinology Contact information: [address] [place] [address] [phone]        Adrian Prows, MD Follow up in 2 week(s).   Specialty:  Cardiology Contact information: [address] [place] [address] [phone]            Consults obtained - cardiology, PCCM  Discharge Condition: stable  Diet and Activity recommendation: See Discharge Instructions below  Discharge Instructions         Discharge Medications     Allergies as of 10/24/2016      Reactions   Ranexa [ranolazine] Shortness Of  Breath   Nsaids    Gastric bypass - ulcer risk   Verapamil Other (See Comments)   hypotension      Medication List    TAKE these medications   CALCIUM 1000 + D PO Take 1 tablet by mouth 3 (three) times daily.   hydrocortisone 10 MG tablet Commonly known as:  CORTEF Take 1 tablet (10 mg total) by  mouth every evening.   hydrocortisone 5 MG tablet Commonly known as:  CORTEF Take 3 tablets (15 mg total) by mouth daily. Start taking on:  10/25/2016   lisdexamfetamine 50 MG capsule Commonly known as:  VYVANSE Take 50 mg by mouth daily.   midodrine 10 MG tablet Commonly known as:  PROAMATINE Take 1 tablet (10 mg total) by mouth 3 (three) times daily with meals.   pantoprazole 40 MG tablet Commonly known as:  PROTONIX Take 40 mg by mouth daily.   PRESCRIPTION MEDICATION Take 1 tablet by mouth 2 (two) times daily. BARIATRIC ADVANTAGE VITAMIN       Major procedures and Radiology Reports - PLEASE review detailed and final reports for all details, in brief -      Dg Chest 2 View  Result Date: 10/23/2016 CLINICAL DATA:  Shortness of breath. Possibly due to fluid overload. EXAM: CHEST  2 VIEW COMPARISON:  10/21/2016 FINDINGS: Improved aeration at the medial right lung base. There are hazy densities along the posterior lower chest. Upper lungs are clear. Heart and mediastinum are within normal limits. The trachea is midline. Negative for a pneumothorax. IMPRESSION: Improved aeration at the right lung base but there are mild densities along the posterior lower chest. Findings could represent dependent edema or small amount of airspace disease. Electronically Signed   By: Markus Daft M.D.   On: 10/23/2016 09:19   Dg Chest Port 1 View  Result Date: 10/21/2016 CLINICAL DATA:  Pulmonary edema EXAM: PORTABLE CHEST 1 VIEW COMPARISON:  Earlier today FINDINGS: Borderline cardiomegaly. Increased more focal opacity at the medial right base. Mild medial left basilar streaky opacity. No Ker[place]  lines, effusion, or pneumothorax. Left IJ catheter with tip directed into the azygos. IMPRESSION: 1. Progressive opacification at the medial right base which could be pneumonia or atelectasis 2. Negative for pulmonary edema. 3. Left IJ catheter with tip directed into the azygos. Electronically Signed   By: Monte Fantasia M.D.   On: 10/21/2016 12:58   Dg Chest Port 1 View  Result Date: 10/21/2016 CLINICAL DATA:  Respiratory failure. EXAM: PORTABLE CHEST 1 VIEW COMPARISON:  10/20/2016. FINDINGS: Left IJ line in stable position. Heart size normal. Mild bibasilar subsegmental atelectasis. Small left pleural effusion cannot be excluded. No pneumothorax. IMPRESSION: 1. Left IJ line stable position. 2. Mild bibasilar atelectasis. Small left pleural effusion cannot be excluded. Electronically Signed   By: Marcello Moores  Register   On: 10/21/2016 06:21   Dg Chest Port 1 View  Result Date: 10/20/2016 CLINICAL DATA:  Central line placement EXAM: PORTABLE CHEST 1 VIEW COMPARISON:  Earlier same day FINDINGS: Left internal jugular central line has its tip either in the SVC at the azygos level or within the azygos vein itself. No pneumothorax. Minimal left base atelectasis. IMPRESSION: Left internal jugular central line tip either in the SVC at the azygos level or in the azygos vein. No pneumothorax. Electronically Signed   By: Nelson Chimes M.D.   On: 10/20/2016 10:50   Dg Chest Port 1 View  Result Date: 10/20/2016 CLINICAL DATA:  Shortness of breath.  Morbid obesity. EXAM: PORTABLE CHEST 1 VIEW COMPARISON:  10/19/2016 FINDINGS: The heart size and mediastinal contours are within normal limits. Low lung volumes are noted, however both lungs are clear. No evidence of pneumothorax or pleural effusion. IMPRESSION: Low lung volumes.  No active disease. Electronically Signed   By: Earle Gell M.D.   On: 10/20/2016 10:21   Dg Chest Port 1 View  Result Date:  10/19/2016 CLINICAL DATA:  Dizziness over the last 2 days.  Syncopal  episode. EXAM: PORTABLE CHEST 1 VIEW COMPARISON:  None. FINDINGS: The heart size and mediastinal contours are within normal limits. Both lungs are clear. The visualized skeletal structures are unremarkable. IMPRESSION: No active disease. Electronically Signed   By: Nelson Chimes M.D.   On: 10/19/2016 06:54    Micro Results     Recent Results (from the past 240 hour(s))  Urine culture     Status: Abnormal   Collection Time: 10/19/16  8:03 AM  Result Value Ref Range Status   Specimen Description URINE, CLEAN CATCH  Final   Special Requests NONE  Final   Culture MULTIPLE SPECIES PRESENT, SUGGEST RECOLLECTION (A)  Final   Report Status 10/20/2016 FINAL  Final  Culture, blood (routine x 2)     Status: None (Preliminary result)   Collection Time: 10/19/16  9:45 AM  Result Value Ref Range Status   Specimen Description BLOOD RIGHT ANTECUBITAL  Final   Special Requests   Final    BOTTLES DRAWN AEROBIC AND ANAEROBIC Blood Culture adequate volume   Culture NO GROWTH 4 DAYS  Final   Report Status PENDING  Incomplete  Culture, blood (routine x 2)     Status: None (Preliminary result)   Collection Time: 10/19/16  9:55 AM  Result Value Ref Range Status   Specimen Description BLOOD RIGHT HAND  Final   Special Requests   Final    BOTTLES DRAWN AEROBIC AND ANAEROBIC Blood Culture adequate volume   Culture NO GROWTH 4 DAYS  Final   Report Status PENDING  Incomplete  MRSA PCR Screening     Status: None   Collection Time: 10/19/16 10:53 AM  Result Value Ref Range Status   MRSA by PCR NEGATIVE NEGATIVE Final    Comment:        The GeneXpert MRSA Assay (FDA approved for NASAL specimens only), is one component of a comprehensive MRSA colonization surveillance program. It is not intended to diagnose MRSA infection nor to guide or monitor treatment for MRSA infections.        Today   Subjective    Jennifer Mayo today is feeling well. No complaints, no dizziness.  no headache,no chestor   abdominal pain,no new weakness tingling or numbness, feels much better wants to go home today.    Objective   Blood pressure 116/78, pulse 63, temperature 97.8 F (36.6 C), temperature source Oral, resp. rate 18, height 5\' 6"  (1.676 m), weight 114.5 kg (252 lb 6.4 oz), SpO2 96 %.   Intake/Output Summary (Last 24 hours) at 10/24/16 0758 Last data filed at 10/24/16 0400  Gross per 24 hour  Intake              130 ml  Output                0 ml  Net              130 ml    Exam Awake Alert, Oriented x 3, No new F.N deficits, Normal affect [place].AT,PERRAL Supple Neck,No JVD, No cervical lymphadenopathy appriciated.  Symmetrical Chest wall movement, Good air movement bilaterally, CTAB RRR,No Gallops,Rubs or new Murmurs, No Parasternal Heave +ve B.Sounds, Abd Soft, Non tender, No organomegaly appriciated, No rebound -guarding or rigidity. No Cyanosis, Clubbing or edema, No new Rash or bruise   Data Review   CBC w Diff:  Lab Results  Component Value Date   WBC 9.3 10/22/2016  HGB 11.1 (L) 10/22/2016   HCT 34.2 (L) 10/22/2016   PLT 165 10/22/2016   LYMPHOPCT 15 10/22/2016   MONOPCT 8 10/22/2016   EOSPCT 0 10/22/2016   BASOPCT 0 10/22/2016    CMP:  Lab Results  Component Value Date   NA 140 10/23/2016   K 3.3 (L) 10/23/2016   CL 103 10/23/2016   CO2 29 10/23/2016   BUN 11 10/23/2016   CREATININE 0.71 10/23/2016   PROT 4.9 (L) 10/22/2016   ALBUMIN 2.7 (L) 10/22/2016   BILITOT 0.3 10/22/2016   ALKPHOS 100 10/22/2016   AST 32 10/22/2016   ALT 59 (H) 10/22/2016  .   Total Time in preparing paper work, data evaluation and todays exam - 56 minutes  Jani Gravel M.D on 10/24/2016 at 7:58 AM  [facility]   Office  [phone]

## 2017-07-28 ENCOUNTER — Other Ambulatory Visit: Payer: Self-pay | Admitting: Family Medicine

## 2017-07-28 DIAGNOSIS — Z780 Asymptomatic menopausal state: Secondary | ICD-10-CM

## 2017-07-28 DIAGNOSIS — Z139 Encounter for screening, unspecified: Secondary | ICD-10-CM

## 2017-09-03 DIAGNOSIS — F331 Major depressive disorder, recurrent, moderate: Secondary | ICD-10-CM | POA: Diagnosis not present

## 2017-09-19 DIAGNOSIS — F331 Major depressive disorder, recurrent, moderate: Secondary | ICD-10-CM | POA: Diagnosis not present

## 2017-10-03 DIAGNOSIS — F331 Major depressive disorder, recurrent, moderate: Secondary | ICD-10-CM | POA: Diagnosis not present

## 2017-10-10 DIAGNOSIS — F331 Major depressive disorder, recurrent, moderate: Secondary | ICD-10-CM | POA: Diagnosis not present

## 2017-10-21 DIAGNOSIS — F331 Major depressive disorder, recurrent, moderate: Secondary | ICD-10-CM | POA: Diagnosis not present

## 2017-10-22 DIAGNOSIS — F331 Major depressive disorder, recurrent, moderate: Secondary | ICD-10-CM | POA: Diagnosis not present

## 2017-10-28 DIAGNOSIS — F331 Major depressive disorder, recurrent, moderate: Secondary | ICD-10-CM | POA: Diagnosis not present

## 2017-11-08 DIAGNOSIS — F411 Generalized anxiety disorder: Secondary | ICD-10-CM | POA: Diagnosis not present

## 2017-11-08 DIAGNOSIS — F331 Major depressive disorder, recurrent, moderate: Secondary | ICD-10-CM | POA: Diagnosis not present

## 2017-11-18 DIAGNOSIS — F411 Generalized anxiety disorder: Secondary | ICD-10-CM | POA: Diagnosis not present

## 2017-11-18 DIAGNOSIS — F331 Major depressive disorder, recurrent, moderate: Secondary | ICD-10-CM | POA: Diagnosis not present

## 2017-11-19 ENCOUNTER — Inpatient Hospital Stay (HOSPITAL_COMMUNITY)
Admission: EM | Admit: 2017-11-19 | Discharge: 2017-11-21 | DRG: 103 | Disposition: A | Discharge status: Home / Self Care | Payer: 59 | Attending: Neurology | Admitting: Neurology

## 2017-11-19 ENCOUNTER — Other Ambulatory Visit: Payer: Self-pay

## 2017-11-19 ENCOUNTER — Encounter (HOSPITAL_COMMUNITY): Payer: Self-pay | Admitting: Radiology

## 2017-11-19 ENCOUNTER — Emergency Department (HOSPITAL_COMMUNITY): Payer: 59

## 2017-11-19 ENCOUNTER — Inpatient Hospital Stay (HOSPITAL_COMMUNITY): Payer: 59

## 2017-11-19 DIAGNOSIS — I160 Hypertensive urgency: Secondary | ICD-10-CM | POA: Diagnosis present

## 2017-11-19 DIAGNOSIS — I639 Cerebral infarction, unspecified: Secondary | ICD-10-CM | POA: Diagnosis not present

## 2017-11-19 DIAGNOSIS — Z8542 Personal history of malignant neoplasm of other parts of uterus: Secondary | ICD-10-CM

## 2017-11-19 DIAGNOSIS — F988 Other specified behavioral and emotional disorders with onset usually occurring in childhood and adolescence: Secondary | ICD-10-CM | POA: Diagnosis present

## 2017-11-19 DIAGNOSIS — F32A Depression, unspecified: Secondary | ICD-10-CM

## 2017-11-19 DIAGNOSIS — E785 Hyperlipidemia, unspecified: Secondary | ICD-10-CM | POA: Diagnosis present

## 2017-11-19 DIAGNOSIS — R7989 Other specified abnormal findings of blood chemistry: Secondary | ICD-10-CM | POA: Diagnosis present

## 2017-11-19 DIAGNOSIS — Z888 Allergy status to other drugs, medicaments and biological substances status: Secondary | ICD-10-CM | POA: Diagnosis not present

## 2017-11-19 DIAGNOSIS — Z9884 Bariatric surgery status: Secondary | ICD-10-CM

## 2017-11-19 DIAGNOSIS — G43409 Hemiplegic migraine, not intractable, without status migrainosus: Secondary | ICD-10-CM | POA: Diagnosis not present

## 2017-11-19 DIAGNOSIS — G4733 Obstructive sleep apnea (adult) (pediatric): Secondary | ICD-10-CM | POA: Diagnosis not present

## 2017-11-19 DIAGNOSIS — R29703 NIHSS score 3: Secondary | ICD-10-CM | POA: Diagnosis present

## 2017-11-19 DIAGNOSIS — I2589 Other forms of chronic ischemic heart disease: Secondary | ICD-10-CM

## 2017-11-19 DIAGNOSIS — R2990 Unspecified symptoms and signs involving the nervous system: Secondary | ICD-10-CM | POA: Diagnosis present

## 2017-11-19 DIAGNOSIS — F329 Major depressive disorder, single episode, unspecified: Secondary | ICD-10-CM | POA: Diagnosis present

## 2017-11-19 DIAGNOSIS — G43419 Hemiplegic migraine, intractable, without status migrainosus: Secondary | ICD-10-CM

## 2017-11-19 DIAGNOSIS — G8191 Hemiplegia, unspecified affecting right dominant side: Secondary | ICD-10-CM | POA: Diagnosis present

## 2017-11-19 DIAGNOSIS — I2585 Chronic coronary microvascular dysfunction: Secondary | ICD-10-CM

## 2017-11-19 DIAGNOSIS — G47419 Narcolepsy without cataplexy: Secondary | ICD-10-CM | POA: Diagnosis present

## 2017-11-19 DIAGNOSIS — G47429 Narcolepsy in conditions classified elsewhere without cataplexy: Secondary | ICD-10-CM | POA: Diagnosis not present

## 2017-11-19 DIAGNOSIS — Z9989 Dependence on other enabling machines and devices: Secondary | ICD-10-CM | POA: Diagnosis not present

## 2017-11-19 DIAGNOSIS — R29818 Other symptoms and signs involving the nervous system: Secondary | ICD-10-CM | POA: Diagnosis not present

## 2017-11-19 DIAGNOSIS — I6389 Other cerebral infarction: Secondary | ICD-10-CM | POA: Diagnosis not present

## 2017-11-19 DIAGNOSIS — R531 Weakness: Secondary | ICD-10-CM | POA: Diagnosis not present

## 2017-11-19 LAB — URINALYSIS, ROUTINE W REFLEX MICROSCOPIC
BILIRUBIN URINE: NEGATIVE
Bacteria, UA: NONE SEEN
GLUCOSE, UA: NEGATIVE mg/dL
HGB URINE DIPSTICK: NEGATIVE
KETONES UR: NEGATIVE mg/dL
LEUKOCYTES UA: NEGATIVE
Nitrite: NEGATIVE
Protein, ur: NEGATIVE mg/dL
Specific Gravity, Urine: 1.02 (ref 1.005–1.030)
pH: 6 (ref 5.0–8.0)

## 2017-11-19 LAB — CBC
HEMATOCRIT: 44.9 % (ref 36.0–46.0)
HEMOGLOBIN: 14.4 g/dL (ref 12.0–15.0)
MCH: 28 pg (ref 26.0–34.0)
MCHC: 32.1 g/dL (ref 30.0–36.0)
MCV: 87.4 fL (ref 78.0–100.0)
Platelets: 182 10*3/uL (ref 150–400)
RBC: 5.14 MIL/uL — AB (ref 3.87–5.11)
RDW: 12.3 % (ref 11.5–15.5)
WBC: 5.9 10*3/uL (ref 4.0–10.5)

## 2017-11-19 LAB — DIFFERENTIAL
ABS IMMATURE GRANULOCYTES: 0 10*3/uL (ref 0.0–0.1)
BASOS ABS: 0 10*3/uL (ref 0.0–0.1)
BASOS PCT: 1 %
Eosinophils Absolute: 0.1 10*3/uL (ref 0.0–0.7)
Eosinophils Relative: 1 %
IMMATURE GRANULOCYTES: 0 %
LYMPHS PCT: 33 %
Lymphs Abs: 1.9 10*3/uL (ref 0.7–4.0)
Monocytes Absolute: 0.4 10*3/uL (ref 0.1–1.0)
Monocytes Relative: 7 %
NEUTROS ABS: 3.4 10*3/uL (ref 1.7–7.7)
NEUTROS PCT: 58 %

## 2017-11-19 LAB — I-STAT BETA HCG BLOOD, ED (MC, WL, AP ONLY): I-stat hCG, quantitative: <5 m[IU]/mL (ref <5)

## 2017-11-19 LAB — I-STAT CHEM 8, ED
BUN: 19 mg/dL (ref 6–20)
Calcium, Ion: 1.16 mmol/L (ref 1.15–1.40)
Chloride: 102 mmol/L (ref 98–111)
Creatinine, Ser: 0.8 mg/dL (ref 0.44–1.00)
Glucose, Bld: 94 mg/dL (ref 70–99)
HCT: 44 % (ref 36.0–46.0)
Hemoglobin: 15 g/dL (ref 12.0–15.0)
POTASSIUM: 3.7 mmol/L (ref 3.5–5.1)
SODIUM: 138 mmol/L (ref 135–145)
TCO2: 26 mmol/L (ref 22–32)

## 2017-11-19 LAB — I-STAT TROPONIN, ED: Troponin i, poc: 0 ng/mL (ref 0.00–0.08)

## 2017-11-19 LAB — PROTIME-INR
INR: 1.02
Prothrombin Time: 13.3 seconds (ref 11.4–15.2)

## 2017-11-19 LAB — COMPREHENSIVE METABOLIC PANEL
ALBUMIN: 3.8 g/dL (ref 3.5–5.0)
ALK PHOS: 77 U/L (ref 38–126)
ALT: 40 U/L (ref 0–44)
AST: 35 U/L (ref 15–41)
Anion gap: 8 (ref 5–15)
BUN: 17 mg/dL (ref 6–20)
CHLORIDE: 105 mmol/L (ref 98–111)
CO2: 26 mmol/L (ref 22–32)
CREATININE: 0.82 mg/dL (ref 0.44–1.00)
Calcium: 9.3 mg/dL (ref 8.9–10.3)
GFR calc non Af Amer: >60 mL/min (ref >60)
GLUCOSE: 97 mg/dL (ref 70–99)
Potassium: 3.8 mmol/L (ref 3.5–5.1)
SODIUM: 139 mmol/L (ref 135–145)
Total Bilirubin: 0.5 mg/dL (ref 0.3–1.2)
Total Protein: 6.2 g/dL — ABNORMAL LOW (ref 6.5–8.1)

## 2017-11-19 LAB — APTT: APTT: 28 s (ref 24–36)

## 2017-11-19 LAB — ETHANOL

## 2017-11-19 MED ORDER — CLEVIDIPINE BUTYRATE 0.5 MG/ML IV EMUL: 0.0000 mg/h | INTRAVENOUS | Status: DC

## 2017-11-19 MED ORDER — SENNOSIDES-DOCUSATE SODIUM 8.6-50 MG PO TABS: 1.0000 | ORAL_TABLET | Freq: Every evening | ORAL | Status: DC | PRN

## 2017-11-19 MED ORDER — ACETAMINOPHEN 325 MG PO TABS
650.0000 mg | ORAL_TABLET | ORAL | Status: DC | PRN
  Administered 2017-11-20: 650 mg via ORAL
  Filled 2017-11-19: qty 2

## 2017-11-19 MED ORDER — IOPAMIDOL (ISOVUE-370) INJECTION 76%
50.0000 mL | Freq: Once | INTRAVENOUS | Status: AC | PRN
  Administered 2017-11-19: 50 mL via INTRAVENOUS

## 2017-11-19 MED ORDER — LABETALOL HCL 5 MG/ML IV SOLN
20.0000 mg | Freq: Once | INTRAVENOUS | Status: AC
  Administered 2017-11-19: 10 mg via INTRAVENOUS

## 2017-11-19 MED ORDER — ALTEPLASE (STROKE) FULL DOSE INFUSION
0.9000 mg/kg | Freq: Once | INTRAVENOUS | Status: AC
  Administered 2017-11-19: 61.2 mg via INTRAVENOUS
  Filled 2017-11-19: qty 100

## 2017-11-19 MED ORDER — IOPAMIDOL (ISOVUE-370) INJECTION 76%
INTRAVENOUS | Status: AC
  Filled 2017-11-19: qty 50

## 2017-11-19 MED ORDER — CLEVIDIPINE BUTYRATE 0.5 MG/ML IV EMUL
INTRAVENOUS | Status: AC
  Filled 2017-11-19: qty 50

## 2017-11-19 MED ORDER — ACETAMINOPHEN 160 MG/5ML PO SOLN: 650.0000 mg | ORAL | Status: DC | PRN

## 2017-11-19 MED ORDER — STROKE: EARLY STAGES OF RECOVERY BOOK
Freq: Once | Status: AC
  Administered 2017-11-20: 05:00:00
  Filled 2017-11-19 (×2): qty 1

## 2017-11-19 MED ORDER — SODIUM CHLORIDE 0.9 % IV SOLN
INTRAVENOUS | Status: DC
  Administered 2017-11-19 – 2017-11-21 (×3): via INTRAVENOUS

## 2017-11-19 MED ORDER — ACETAMINOPHEN 650 MG RE SUPP: 650.0000 mg | RECTAL | Status: DC | PRN

## 2017-11-19 NOTE — H&P (Signed)
STROKE H&P  Reason for Consult: Right-sided weakness Referring Physician: Dr. Dayna Barker  CC: Right-sided weakness  History is obtained from: Patient, husband  HPI: Jennifer Mayo is a 42 y.o. 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  She did not make much of it but her weakness started to get worse with inability to push the door from the right hand.  She came in for evaluation to the emergency room and waited in the lobby to be triaged and when the triage nurse first examined her, she noticed some right-sided weakness and immediately on first contact initiated code stroke. On my initial examination, the patient was an NIH stroke scale of 3 for a mild right upper extremity, right lower extremity and right lower facial weakness. She also complained of a minor headache.  Does not have a history of migraines.  Has not had migraines as a young adult as well. I discussed with her the possibility of this being a complex migraine versus stroke and her being within the window for IV thrombolysis the risks and benefits of IV TPA.  I also discussed this with her husband in detail. Given that symptoms involved her dominant side, even with a low NIH stroke scale, she and her husband agreed to pursue IV TPA. IV TPA was started-bolus at 2050 hrs. on 11/19/2017.  LKW: 5 PM on 11/19/2017 tpa given?:  Yes Premorbid modified Rankin scale (mRS):0  ROS: ROS was performed and is negative except as noted in the HPI.    Past Medical History:  Diagnosis Date  . Anemia   . Cancer [facility])    endometrial  . Cardiac microvascular disease   . CHF (congestive heart failure) ([place])   . Depression    . History of endometrial cancer    had hysterectomy in August 2015  . Mild diastolic dysfunction   . Narcolepsy   . Obstructive sleep apnea   . Pleural effusion, bilateral    3-4 years   . Solitary pulmonary nodule on lung CT    follow up CT recommended per medical records  . Thyroid disease   . Vitamin B 12 deficiency    Family History  Problem Relation Age of Onset  . Cancer Mother   . Brain cancer Mother   . Seizures Mother   . Stroke Mother   . Diabetes Father    Social History:   reports that she has never smoked. She has never used smokeless tobacco. She reports that she drinks alcohol. She reports that she does not use drugs.  Medications  Current Facility-Administered Medications:  .   stroke: mapping our early stages of recovery book, , Does not apply, Once, Amie Portland, MD .  0.9 %  sodium chloride infusion, , Intravenous, Continuous, Amie Portland, MD .  acetaminophen (TYLENOL) tablet 650 mg, 650 mg, Oral, Q4H PRN **OR** acetaminophen (TYLENOL) solution 650 mg, 650 mg, Per Tube, Q4H PRN **OR** acetaminophen (TYLENOL) suppository 650 mg, 650 mg, Rectal, Q4H PRN, Amie Portland, MD .  alteplase (ACTIVASE) 1 mg/mL infusion 61.2 mg, 0.9 mg/kg, Intravenous, Once, Amie Portland, MD .  clevidipine (CLEVIPREX) 0.5 MG/ML infusion, , , ,  .  labetalol (NORMODYNE,TRANDATE) injection  20 mg, 20 mg, Intravenous, Once **AND** clevidipine (CLEVIPREX) infusion 0.5 mg/mL, 0-21 mg/hr, Intravenous, Continuous, Amie Portland, MD .  iopamidol (ISOVUE-370) 76 % injection, , , ,  .  senna-docusate (Senokot-S) tablet 1 tablet, 1 tablet, Oral, QHS PRN, Amie Portland, MD  Current Outpatient Medications:  .  Calcium Carb-Cholecalciferol (CALCIUM 1000 + D PO), Take 1 tablet by mouth 3 (three) times daily., Disp: , Rfl:  .  hydrocortisone (CORTEF) 10 MG tablet, Take 1 tablet (10 mg total) by mouth every evening., Disp: 30 tablet, Rfl: 0 .  hydrocortisone (CORTEF) 5 MG tablet, Take 3 tablets (15  mg total) by mouth daily., Disp: 90 tablet, Rfl: 0 .  lisdexamfetamine (VYVANSE) 50 MG capsule, Take 50 mg by mouth daily., Disp: , Rfl:  .  midodrine (PROAMATINE) 10 MG tablet, Take 1 tablet (10 mg total) by mouth 3 (three) times daily with meals., Disp: 90 tablet, Rfl: 0 .  pantoprazole (PROTONIX) 40 MG tablet, Take 40 mg by mouth daily., Disp: , Rfl:  .  PRESCRIPTION MEDICATION, Take 1 tablet by mouth 2 (two) times daily. BARIATRIC ADVANTAGE VITAMIN, Disp: , Rfl:   Exam: Current vital signs: BP (!) 135/97 (BP Location: Right Arm)   Pulse 81   Temp 98.7 F (37.1 C) (Oral)   Resp 17   Ht 5\' 6"  (1.676 m)   Wt 68 kg (150 lb)   SpO2 97%   BMI 24.21 kg/m  Vital signs in last 24 hours: Temp:  [98.7 F (37.1 C)] 98.7 F (37.1 C) (07/31 1954) Pulse Rate:  [81] 81 (07/31 1954) Resp:  [17] 17 (07/31 1954) BP: (135)/(97) 135/97 (07/31 1954) SpO2:  [97 %] 97 % (07/31 1954) Weight:  [68 kg (150 lb)] 68 kg (150 lb) (07/31 2008)  GENERAL: Awake, alert in NAD HEENT: - Normocephalic and atraumatic, dry mm, no LN++, no Thyromegally LUNGS - Clear to auscultation bilaterally with no wheezes CV - S1S2 RRR, no m/r/g, equal pulses bilaterally. ABDOMEN - Soft, nontender, nondistended with normoactive BS Ext: warm, well perfused, intact peripheral pulses, no edema  NEURO:  Mental Status: AA&Ox3  Language: speech is not dysarthric.  Naming, repetition, fluency, and comprehension intact. Cranial Nerves: PERRL. EOMI, visual fields full, mild flattening of the right nasolabial fold, facial sensation intact, hearing intact, tongue/uvula/soft palate midline, normal sternocleidomastoid and trapezius muscle strength. No evidence of tongue atrophy or fibrillations Motor: 4/5 right upper and right lower extremity with vertical drift.  Left lower and upper extremity full-strength. Tone: is normal and bulk is normal Sensation- Intact to light touch bilaterally, no extinction Coordination: FTN intact  bilaterally, no ataxia in BLE. Gait- deferred  NIHSS-3   Labs I have reviewed labs in epic and the results pertinent to this consultation are:  CBC    Component Value Date/Time   WBC 5.9 11/19/2017 2018   RBC 5.14 (H) 11/19/2017 2018   HGB 15.0 11/19/2017 2031   HCT 44.0 11/19/2017 2031   PLT 182 11/19/2017 2018   MCV 87.4 11/19/2017 2018   MCH 28.0 11/19/2017 2018   MCHC 32.1 11/19/2017 2018   RDW 12.3 11/19/2017 2018   LYMPHSABS 1.9 11/19/2017 2018   MONOABS 0.4 11/19/2017 2018   EOSABS 0.1 11/19/2017 2018   BASOSABS 0.0 11/19/2017 2018    CMP     Component Value Date/Time   NA 138 11/19/2017 2031   K 3.7 11/19/2017 2031   CL 102 11/19/2017 2031   CO2 30 10/24/2016 0732   GLUCOSE 94 11/19/2017  2031   BUN 19 11/19/2017 2031   CREATININE 0.80 11/19/2017 2031   CALCIUM 9.0 10/24/2016 0732   PROT 4.9 (L) 10/22/2016 0350   ALBUMIN 2.7 (L) 10/22/2016 0350   AST 32 10/22/2016 0350   ALT 59 (H) 10/22/2016 0350   ALKPHOS 100 10/22/2016 0350   BILITOT 0.3 10/22/2016 0350   GFRNONAA >60 10/24/2016 0732   GFRAA >60 10/24/2016 0732   Imaging I have reviewed the images obtained:  CT-scan of the brain: No acute changes.  Aspects 10. CT angiogram head and neck-no large vessel occlusion.  Assessment:  42 year old woman with past medical history as above presenting for evaluation of acute onset of right-sided weakness.  Also complained of a very mild headache with no history of migraines in the past.  Differentials based on the history and clinical exam with an NIH stroke scale of 3 include complex migraine versus pure motor small vessel lacunar infarct. Less likely is demyelinating disease due to sudden onset of symptoms but it should be considered in differentials.  Risk &  benefits of IV TPA were discussed.  Given the involvement of her dominant side, decision was made to proceed with IV TPA.  IV TPA bolus at 2050 hrs. on 11/19/2017.  Impression: Evaluate for acute  ischemic stroke Evaluate for complex migraine Evaluate for underlying demyelinating disorder  Plan: Acute Ischemic Stroke Acuity: Acute Current Suspected Etiology: small vessel disease Continue Evaluation:  -Admit to: stepdown unit 3W -Hold Aspirin until 24 hour post tPA neuroimaging is stable and without evidence of bleeding -Blood pressure control, goal of SYS <180 -MRI/ECHO/A1C/Lipid panel. -Hyperglycemia management per SSI to maintain glucose 140-180mg /dL. -PT/OT/ST therapies and recommendations when able  CNS -Close neuro monitoring -PT OT ST  Hemiplegia and hemiparesis following cerebral infarction affecting right dominant side -PT/OT  RESP -No active issues -Check chest x-ray -Monitor clinically  CV Hypertensive Urgency -Aggressive BP control, goal SBP < 180 -Labetalol as needed and Cleviprex -TTE  Hyperlipidemia, unspecified  - Statin for goal LDL < 70   HEME No active issues -Monitor -transfuse for hgb < 7   ENDO -goal HgbA1c < 7  GI/GU -Gentle hydration -Check BMP in the morning  Fluid/Electrolyte Disorders -No active issues -Check labs in the morning and replete as necessary  ID Possible Aspiration PNA -CXR -NPO -Monitor  Prophylaxis DVT: No antiplatelets or anticoagulants for 24 hours from TPA. GI: Not applicable Bowel: Docusate, senna  Diet: NPO until cleared by speech or bedside swallow  Code Status: Full Code    THE FOLLOWING WERE PRESENT ON ADMISSION: CNS -  Acute Ischemic Stroke History of endometrial cancer  Delays in the process: Activation of code stroke was delayed from the triage, even in activation of the code stroke the actual page went out after the neurologist had been paged by the CT technologist informing that the patient was already in [place].  I spoke in depth to the patient, husband in person with the plan and the treatment philososphy. I also spoke over the phone with patient's brother, who is a Publishing rights manager in  [place] and answered all questions. I have provided my information along with my cellphone number to the patient, husband should they need to reach me during this shift.  -- Amie Portland, MD [facility] Pager: [phone] If 7pm to 7am, please call on call as listed on AMION.  CRITICAL CARE ATTESTATION This patient is critically ill and at significant risk of neurological worsening, death and care requires constant monitoring of vital  signs, hemodynamics,respiratory and cardiac monitoring. I spent 60  minutes of neurocritical care time performing neurological assessment, discussion with family, other specialists and medical decision making of high complexityin the care of  this patient.  Marland Kitchen

## 2017-11-19 NOTE — ED Provider Notes (Signed)
Patient placed in Quick Look pathway, seen and evaluated   Chief Complaint: R sided weakness at 5pm  HPI:   Pt is a 43 y.o. female with a PMHx of CHF, anemia, presenting today with c/o R arm weakness/heaviness that began at 5pm, went to shake a pt's hand and missed, then couldn't open a door. Noticed her grip was weaker. Started getting headache as well and then stood up and got lightheaded. Also noticed her R leg is weak that started after she got here. A little bit of tingling in her R arm and leg but no numbness. No vision changes, slurred speech, CP, SOB, abd pain, n/v, numbness.   ROS: +R sided weakness   Physical Exam:  BP (!) 135/97 (BP Location: Right Arm)   Pulse 81   Temp 98.7 F (37.1 C) (Oral)   Resp 17   Ht 5\' 6"  (1.676 m)   Wt 68 kg (150 lb)   SpO2 97%   BMI 24.21 kg/m    Gen: No distress  Neuro: Awake and oriented x4, R arm and leg weaker than L side with most of the muscle testing, no facial droop or asymmetry, no difficulty word finding or slurred speech. No pronator drift noted. PERRL, EOMI, no nystagmus  Skin: Warm    Focused Exam: see above   Initiation of care has begun. The patient has been counseled on the process, plan, and necessity for staying for the completion/evaluation, and the remainder of the medical screening examination   I informed nursing staff that this patient needs a bed ASAP. Code Stroke activated due to being within the window and having objective weakness of R side with concern for progressive symptoms.     [address] [place], [place] 11/19/17 2030    Merrily Pew, MD 11/19/17 2042

## 2017-11-19 NOTE — ED Provider Notes (Signed)
Emergency Department Provider Note   I have reviewed the triage vital signs and the nursing notes.   HISTORY  Chief Complaint Extremity Weakness and Code Stroke   HPI Jennifer Mayo is a 42 y.o. female with multiple medical problems as documented below that presents for right sided weakness. LKW around 1700 today. Started with right sided arm weakness and progressed while in ED to right sided leg weakness as well. Code stroke called. Mild left posterior headache.   No other associated or modifying symptoms.    Past Medical History:  Diagnosis Date  . Anemia   . Cancer [facility])    endometrial  . Cardiac microvascular disease   . CHF (congestive heart failure) ([place])   . Depression   . History of endometrial cancer    had hysterectomy in August 2015  . Mild diastolic dysfunction   . Narcolepsy   . Obstructive sleep apnea   . Pleural effusion, bilateral    3-4 years   . Solitary pulmonary nodule on lung CT    follow up CT recommended per medical records  . Thyroid disease   . Vitamin B 12 deficiency     Patient Active Problem List   Diagnosis Date Noted  . Acute ischemic stroke ([place]) 11/19/2017  . Adrenal insufficiency ([place]) 10/23/2016  . Atelectasis   . Acute respiratory failure ([place])   . Encounter for central line placement   . Near syncope 10/19/2016  . Hypotension 07/05/2016  . Morbid obesity due to excess calories ([place]) 07/01/2016  . Solitary pulmonary nodule on lung CT   . Narcolepsy   . Vitamin B 12 deficiency   . Mild diastolic dysfunction   . Obstructive sleep apnea     Past Surgical History:  Procedure Laterality Date  . ABDOMINAL HYSTERECTOMY    . CHOLECYSTECTOMY    . 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 UPPER GI ENDOSCOPY   07/01/2016   Procedure: UPPER GI ENDOSCOPY;  Surgeon: Arta Bruce Kinsinger, MD;  Location: [facility];  Service: General;;      Allergies Ranexa [ranolazine]; Nsaids; and Verapamil  Family History  Problem Relation Age of Onset  . Cancer Mother   . Brain cancer Mother   . Seizures Mother   . Stroke Mother   . Diabetes Father     Social History Social History   Tobacco Use  . Smoking status: Never Smoker  . Smokeless tobacco: Never Used  Substance Use Topics  . Alcohol use: Yes    Alcohol/week: 0.0 oz    Comment: very seldom  . Drug use: No    Review of Systems  All other systems negative except as documented in the HPI. All pertinent positives and negatives as reviewed in the HPI. ____________________________________________   PHYSICAL EXAM:  VITAL SIGNS: ED Triage Vitals  Enc Vitals Group     BP 11/19/17 1954 (!) 135/97     Pulse Rate 11/19/17 1954 81     Resp 11/19/17 1954 17     Temp 11/19/17 1954 98.7 F (37.1 C)     Temp Source 11/19/17 1954 Oral     SpO2 11/19/17 1954 97 %     Weight 11/19/17 1953 150 lb (68 kg)     Height 11/19/17 1953 5\' 6"  (1.676 m)  Constitutional: Alert and oriented. Well appearing and in no acute distress. Eyes: Conjunctivae are normal. PERRL. EOMI. Head: Atraumatic. Nose: No congestion/rhinnorhea. Mouth/Throat: Mucous membranes are moist.  Oropharynx non-erythematous. Neck: No stridor.  No meningeal signs.   Cardiovascular: Normal rate, regular rhythm. Good peripheral circulation. Grossly normal heart sounds.   Respiratory: Normal respiratory effort.  No retractions. Lungs CTAB. Gastrointestinal: Soft and nontender. No distention.  Musculoskeletal: No lower extremity tenderness nor edema. No gross deformities of extremities. Neurologic:  Normal speech and language. Mild R extremity weakness in upper and lower. Normal sensation. No CN abnormalities.  Skin:  Skin is warm, dry and intact. No rash  noted.  ____________________________________________   LABS (all labs ordered are listed, but only abnormal results are displayed)  Labs Reviewed  CBC - Abnormal; Notable for the following components:      Result Value   RBC 5.14 (*)    All other components within normal limits  COMPREHENSIVE METABOLIC PANEL - Abnormal; Notable for the following components:   Total Protein 6.2 (*)    All other components within normal limits  RAPID URINE DRUG SCREEN, HOSP PERFORMED - Abnormal; Notable for the following components:   Amphetamines POSITIVE (*)    All other components within normal limits  PROTIME-INR  APTT  DIFFERENTIAL  ETHANOL  URINALYSIS, ROUTINE W REFLEX MICROSCOPIC  HIV ANTIBODY (ROUTINE TESTING)  HEMOGLOBIN A1C  LIPID PANEL  I-STAT TROPONIN, ED  I-STAT CHEM 8, ED  I-STAT BETA HCG BLOOD, ED ([facility], [facility], [facility] ONLY)  CBG MONITORING, ED   ____________________________________________  EKG   EKG Interpretation  Date/Time:  Wednesday November 19 2017 20:14:34 EDT Ventricular Rate:  76 PR Interval:  130 QRS Duration: 86 QT Interval:  402 QTC Calculation: 452 R Axis:   61 Text Interpretation:  Normal sinus rhythm Normal ECG No significant change since last tracing Confirmed by Merrily Pew [phone]) on 11/20/2017 12:12:50 AM       ____________________________________________  RADIOLOGY  Ct Angio Head W Or Wo Contrast  Result Date: 11/19/2017 CLINICAL DATA:  Acute onset right-sided weakness. Last seen normal 3 hours earlier. EXAM: CT ANGIOGRAPHY HEAD AND NECK TECHNIQUE: Multidetector CT imaging of the head and neck was performed using the standard protocol during bolus administration of intravenous contrast. Multiplanar CT image reconstructions and MIPs were obtained to evaluate the vascular anatomy. Carotid stenosis measurements (when applicable) are obtained utilizing NASCET criteria, using the distal internal carotid diameter as the denominator. CONTRAST:  30mL ISOVUE-370  IOPAMIDOL (ISOVUE-370) INJECTION 76% COMPARISON:  Head CT 11/19/2017 FINDINGS: CTA NECK FINDINGS AORTIC ARCH: There is no calcific atherosclerosis of the aortic arch. There is no aneurysm, dissection or hemodynamically significant stenosis of the visualized ascending aorta and aortic arch. Conventional 3 vessel aortic branching pattern. The visualized proximal subclavian arteries are widely patent. RIGHT CAROTID SYSTEM: --Common carotid artery: Widely patent origin without common carotid artery dissection or aneurysm. --Internal carotid artery: No dissection, occlusion or aneurysm. No hemodynamically significant stenosis. --External carotid artery: No acute abnormality. LEFT CAROTID SYSTEM: --Common carotid artery: Widely patent origin without common carotid artery dissection or aneurysm. --Internal carotid artery:No dissection, occlusion or aneurysm. No hemodynamically significant stenosis. --External carotid artery: No acute abnormality. VERTEBRAL ARTERIES: Left dominant configuration. Both origins are normal. No dissection, occlusion or flow-limiting stenosis to the vertebrobasilar confluence. SKELETON: There is no bony spinal canal stenosis. No lytic or blastic lesion. OTHER NECK: Normal pharynx, larynx and major salivary glands. No cervical lymphadenopathy. Unremarkable thyroid gland. UPPER CHEST: No pneumothorax or  pleural effusion. No nodules or masses. CTA HEAD FINDINGS ANTERIOR CIRCULATION: --Intracranial internal carotid arteries: Normal. --Anterior cerebral arteries: Normal. Both A1 segments are present. Patent anterior communicating artery. --Middle cerebral arteries: Normal. --Posterior communicating arteries: Present bilaterally. POSTERIOR CIRCULATION: --Basilar artery: Normal. --Posterior cerebral arteries: Normal. --Superior cerebellar arteries: Normal. --Inferior cerebellar arteries: Normal anterior and posterior inferior cerebellar arteries. VENOUS SINUSES: As permitted by contrast timing, patent.  ANATOMIC VARIANTS: None DELAYED PHASE: No parenchymal contrast enhancement. Review of the MIP images confirms the above findings. IMPRESSION: Normal CTA of the head and neck. Electronically Signed   By: Ulyses Jarred M.D.   On: 11/19/2017 21:15   Dg Chest 2 View  Result Date: 11/19/2017 CLINICAL DATA:  Stroke EXAM: CHEST - 2 VIEW COMPARISON:  01/30/2017 FINDINGS: Lungs are clear.  No pleural effusion or pneumothorax. The heart is normal in size. Visualized osseous structures are within normal limits. IMPRESSION: Normal chest radiographs. Electronically Signed   By: Julian Hy M.D.   On: 11/19/2017 22:14   Ct Angio Neck W Or Wo Contrast  Result Date: 11/19/2017 CLINICAL DATA:  Acute onset right-sided weakness. Last seen normal 3 hours earlier. EXAM: CT ANGIOGRAPHY HEAD AND NECK TECHNIQUE: Multidetector CT imaging of the head and neck was performed using the standard protocol during bolus administration of intravenous contrast. Multiplanar CT image reconstructions and MIPs were obtained to evaluate the vascular anatomy. Carotid stenosis measurements (when applicable) are obtained utilizing NASCET criteria, using the distal internal carotid diameter as the denominator. CONTRAST:  64mL ISOVUE-370 IOPAMIDOL (ISOVUE-370) INJECTION 76% COMPARISON:  Head CT 11/19/2017 FINDINGS: CTA NECK FINDINGS AORTIC ARCH: There is no calcific atherosclerosis of the aortic arch. There is no aneurysm, dissection or hemodynamically significant stenosis of the visualized ascending aorta and aortic arch. Conventional 3 vessel aortic branching pattern. The visualized proximal subclavian arteries are widely patent. RIGHT CAROTID SYSTEM: --Common carotid artery: Widely patent origin without common carotid artery dissection or aneurysm. --Internal carotid artery: No dissection, occlusion or aneurysm. No hemodynamically significant stenosis. --External carotid artery: No acute abnormality. LEFT CAROTID SYSTEM: --Common carotid  artery: Widely patent origin without common carotid artery dissection or aneurysm. --Internal carotid artery:No dissection, occlusion or aneurysm. No hemodynamically significant stenosis. --External carotid artery: No acute abnormality. VERTEBRAL ARTERIES: Left dominant configuration. Both origins are normal. No dissection, occlusion or flow-limiting stenosis to the vertebrobasilar confluence. SKELETON: There is no bony spinal canal stenosis. No lytic or blastic lesion. OTHER NECK: Normal pharynx, larynx and major salivary glands. No cervical lymphadenopathy. Unremarkable thyroid gland. UPPER CHEST: No pneumothorax or pleural effusion. No nodules or masses. CTA HEAD FINDINGS ANTERIOR CIRCULATION: --Intracranial internal carotid arteries: Normal. --Anterior cerebral arteries: Normal. Both A1 segments are present. Patent anterior communicating artery. --Middle cerebral arteries: Normal. --Posterior communicating arteries: Present bilaterally. POSTERIOR CIRCULATION: --Basilar artery: Normal. --Posterior cerebral arteries: Normal. --Superior cerebellar arteries: Normal. --Inferior cerebellar arteries: Normal anterior and posterior inferior cerebellar arteries. VENOUS SINUSES: As permitted by contrast timing, patent. ANATOMIC VARIANTS: None DELAYED PHASE: No parenchymal contrast enhancement. Review of the MIP images confirms the above findings. IMPRESSION: Normal CTA of the head and neck. Electronically Signed   By: Ulyses Jarred M.D.   On: 11/19/2017 21:15   Ct Head Code Stroke Wo Contrast  Result Date: 11/19/2017 CLINICAL DATA:  Code stroke. Focal neuro deficit, less than 6 hours, stroke suspected. Acute onset of right-sided weakness. Last seen normal 3 hours ago. EXAM: CT HEAD WITHOUT CONTRAST TECHNIQUE: Contiguous axial images were obtained from the base of the skull  through the vertex without intravenous contrast. COMPARISON:  CT head without contrast 01/30/2017. FINDINGS: Brain: No acute infarct, hemorrhage, or  mass lesion is present. Basal ganglia are within normal limits. Insular cortex is unremarkable. No acute or focal cortical lesion is present. The ventricles are of normal size. No significant extraaxial fluid collection is present. No significant white matter disease is present. The brainstem and cerebellum are normal. Vascular: No hyperdense vessel or unexpected calcification. Skull: Calvarium is intact. No focal lytic or blastic lesions are present. No significant extracranial soft tissue abnormalities are present. Sinuses/Orbits: The paranasal sinuses and mastoid air cells are clear. Globes and orbits are within normal limits. ASPECTS [facility] Stroke Program Early CT Score) - Ganglionic level infarction (caudate, lentiform nuclei, internal capsule, insula, M1-M3 cortex): 7/7 - Supraganglionic infarction (M4-M6 cortex): 3/3 Total score (0-10 with 10 being normal): 10/10 IMPRESSION: 1. Negative CT of the head 2. ASPECTS is 10/10 The above was relayed via text pager to Dr. Rory Percy on 11/19/2017 at 20:41 . Electronically Signed   By: San Morelle M.D.   On: 11/19/2017 20:44    ____________________________________________   PROCEDURES  Procedure(s) performed:   Procedures  CRITICAL CARE Performed by: Merrily Pew Total critical care time: 35 minutes Critical care time was exclusive of separately billable procedures and treating other patients. Critical care was necessary to treat or prevent imminent or life-threatening deterioration. Critical care was time spent personally by me on the following activities: development of treatment plan with patient and/or surrogate as well as nursing, discussions with consultants, evaluation of patient's response to treatment, examination of patient, obtaining history from patient or surrogate, ordering and performing treatments and interventions, ordering and review of laboratory studies, ordering and review of radiographic studies, pulse oximetry and  re-evaluation of patient's condition.  ____________________________________________   INITIAL IMPRESSION / ASSESSMENT AND PLAN / ED COURSE  Code stroke. Neurology for dispo.   tpa given. No worsening of symptoms or headache afterwards.   Admit to neuro.   Pertinent labs & imaging results that were available during my care of the patient were reviewed by me and considered in my medical decision making (see chart for details).  ____________________________________________  FINAL CLINICAL IMPRESSION(S) / ED DIAGNOSES  Final diagnoses:  Acute ischemic stroke ([place])     MEDICATIONS GIVEN DURING THIS VISIT:  Medications  clevidipine (CLEVIPREX) 0.5 MG/ML infusion (has no administration in time range)  labetalol (NORMODYNE,TRANDATE) injection 20 mg (10 mg Intravenous Given 11/19/17 2045)    And  clevidipine (CLEVIPREX) infusion 0.5 mg/mL (has no administration in time range)   stroke: mapping our early stages of recovery book (has no administration in time range)  0.9 %  sodium chloride infusion ( Intravenous New Bag/Given 11/19/17 2128)  acetaminophen (TYLENOL) tablet 650 mg (has no administration in time range)    Or  acetaminophen (TYLENOL) solution 650 mg (has no administration in time range)    Or  acetaminophen (TYLENOL) suppository 650 mg (has no administration in time range)  senna-docusate (Senokot-S) tablet 1 tablet (has no administration in time range)  alteplase (ACTIVASE) 1 mg/mL infusion 61.2 mg (0 mg/kg  68 kg Intravenous Stopped 11/19/17 2144)  iopamidol (ISOVUE-370) 76 % injection 50 mL (50 mLs Intravenous Contrast Given 11/19/17 2104)     NEW OUTPATIENT MEDICATIONS STARTED DURING THIS VISIT:  Current Discharge Medication List      Note:  This note was prepared with assistance of Dragon voice recognition software. Occasional wrong-word or sound-a-like substitutions may have occurred due  to the inherent limitations of voice recognition software.   Noelia Lenart,  Corene Cornea, MD 11/20/17 [phone]

## 2017-11-19 NOTE — ED Notes (Signed)
Patient transported to X-ray 

## 2017-11-19 NOTE — ED Notes (Signed)
Pt in x ray, unable to perform neuro check

## 2017-11-19 NOTE — Progress Notes (Signed)
1925: Pt in triage with R arm weakness 2004: Triage RN assessed pt 2018: Labs drawn 2030: Code stroke called, LKW 1700 2032: Pt in [place], Neurology, pharmacy and triage RN present 2035: RRT RN present, 18g placed in [place] and 20g LFA, NIH-3, BP 153/102 2050: 10mg  Labetalol given and tPA started 2105: Pt transported back to room, VSS, pt A&Ox4.   Will continue vitals q33min. Instructed RN to call with any questions or concerns.

## 2017-11-19 NOTE — Progress Notes (Signed)
Pharmacist Code Stroke Response  Notified to mix tPA at 847PM by Dr. Rory Percy Delivered tPA to RN at 850 PM   Issues/delays encountered (if applicable): none  Dawayne Cirri 11/19/17 8:54 PM

## 2017-11-19 NOTE — ED Triage Notes (Signed)
Patient c/o right arm heaviness; noticed when she was shaking someone's hand she "missed" their hand and had difficulty pushing a door. This began @ 5 p.m.

## 2017-11-20 ENCOUNTER — Inpatient Hospital Stay (HOSPITAL_COMMUNITY): Payer: 59

## 2017-11-20 ENCOUNTER — Encounter (HOSPITAL_COMMUNITY): Payer: Self-pay

## 2017-11-20 DIAGNOSIS — G43109 Migraine with aura, not intractable, without status migrainosus: Secondary | ICD-10-CM

## 2017-11-20 DIAGNOSIS — Z9884 Bariatric surgery status: Secondary | ICD-10-CM

## 2017-11-20 DIAGNOSIS — I639 Cerebral infarction, unspecified: Secondary | ICD-10-CM

## 2017-11-20 DIAGNOSIS — E785 Hyperlipidemia, unspecified: Secondary | ICD-10-CM

## 2017-11-20 DIAGNOSIS — Z9989 Dependence on other enabling machines and devices: Secondary | ICD-10-CM

## 2017-11-20 DIAGNOSIS — G4733 Obstructive sleep apnea (adult) (pediatric): Secondary | ICD-10-CM

## 2017-11-20 LAB — HEMOGLOBIN A1C
Hgb A1c MFr Bld: 5.2 % (ref 4.8–5.6)
Mean Plasma Glucose: 102.54 mg/dL

## 2017-11-20 LAB — ECHOCARDIOGRAM COMPLETE
Height: 66 in
Weight: 2400 oz

## 2017-11-20 LAB — HIV ANTIBODY (ROUTINE TESTING W REFLEX): HIV Screen 4th Generation wRfx: NONREACTIVE

## 2017-11-20 LAB — RAPID URINE DRUG SCREEN, HOSP PERFORMED
Amphetamines: POSITIVE — AB
BARBITURATES: NOT DETECTED
Benzodiazepines: NOT DETECTED
Cocaine: NOT DETECTED
Opiates: NOT DETECTED
TETRAHYDROCANNABINOL: NOT DETECTED

## 2017-11-20 LAB — LIPID PANEL
CHOL/HDL RATIO: 2.4 ratio
Cholesterol: 134 mg/dL (ref 0–200)
HDL: 55 mg/dL (ref >40)
LDL Cholesterol: 72 mg/dL (ref 0–99)
Triglycerides: 37 mg/dL (ref <150)
VLDL: 7 mg/dL (ref 0–40)

## 2017-11-20 MED ORDER — LISDEXAMFETAMINE DIMESYLATE 70 MG PO CAPS
70.0000 mg | ORAL_CAPSULE | Freq: Every day | ORAL | Status: DC
  Administered 2017-11-20 – 2017-11-21 (×2): 70 mg via ORAL
  Filled 2017-11-20 (×2): qty 1

## 2017-11-20 MED ORDER — FLUOXETINE HCL 20 MG PO CAPS
40.0000 mg | ORAL_CAPSULE | Freq: Every day | ORAL | Status: DC
  Administered 2017-11-20 – 2017-11-21 (×2): 40 mg via ORAL
  Filled 2017-11-20 (×2): qty 2

## 2017-11-20 MED ORDER — ATORVASTATIN CALCIUM 10 MG PO TABS: 10.0000 mg | ORAL_TABLET | Freq: Every day | ORAL | Status: DC

## 2017-11-20 MED ORDER — SODIUM CHLORIDE 0.9 % IV BOLUS
1000.0000 mL | Freq: Once | INTRAVENOUS | Status: AC
  Administered 2017-11-20: 1000 mL via INTRAVENOUS

## 2017-11-20 MED ORDER — MODAFINIL 100 MG PO TABS
200.0000 mg | ORAL_TABLET | Freq: Every day | ORAL | Status: DC
  Administered 2017-11-20 – 2017-11-21 (×2): 200 mg via ORAL
  Filled 2017-11-20 (×2): qty 2

## 2017-11-20 MED ORDER — BUPROPION HCL ER (XL) 150 MG PO TB24
300.0000 mg | ORAL_TABLET | Freq: Every day | ORAL | Status: DC
  Administered 2017-11-20 – 2017-11-21 (×2): 300 mg via ORAL
  Filled 2017-11-20 (×2): qty 2

## 2017-11-20 MED ORDER — DIPHENHYDRAMINE HCL 25 MG PO CAPS
50.0000 mg | ORAL_CAPSULE | Freq: Once | ORAL | Status: AC
  Administered 2017-11-20: 50 mg via ORAL
  Filled 2017-11-20: qty 2

## 2017-11-20 NOTE — Progress Notes (Signed)
Physical therapy asked if it was okay to work with the pt. RN paged  MD Erlinda Hong) per MD XU its okay to work with the pt as long as she is okay and able to tolerate. RN notified physical Therqapy.

## 2017-11-20 NOTE — Progress Notes (Addendum)
STROKE TEAM PROGRESS NOTE   INTERVAL HISTORY Her husband is at the bedside.  Overall, she is feeling better. R sided strength she estimated now about 90% recovery.  Some mental fog, but clearing - this am, difficulty with word finding, now resolved. Has worked with PT, OT and ST - OP PT and OT recommended. Discussed stroke treatment, risk factors and differential.   Vitals:   11/20/17 1055 11/20/17 1155 11/20/17 1254 11/20/17 1354  BP: 113/78 (!) 126/91 122/88 (!) 145/92  Pulse:  73 92 87  Resp: 16 12 12 16   Temp: 98.7 F (37.1 C) 98.4 F (36.9 C) 98.3 F (36.8 C) 98.4 F (36.9 C)  TempSrc: Oral Oral Oral Oral  SpO2: 100% 99% 99% 98%  Weight:      Height:        CBC:  Recent Labs  Lab 11/19/17 2018 11/19/17 2031  WBC 5.9  --   NEUTROABS 3.4  --   HGB 14.4 15.0  HCT 44.9 44.0  MCV 87.4  --   PLT 182  --     Basic Metabolic Panel:  Recent Labs  Lab 11/19/17 2018 11/19/17 2031  NA 139 138  K 3.8 3.7  CL 105 102  CO2 26  --   GLUCOSE 97 94  BUN 17 19  CREATININE 0.82 0.80  CALCIUM 9.3  --    Lipid Panel:     Component Value Date/Time   CHOL 134 11/20/2017 0533   TRIG 37 11/20/2017 0533   HDL 55 11/20/2017 0533   CHOLHDL 2.4 11/20/2017 0533   VLDL 7 11/20/2017 0533   LDLCALC 72 11/20/2017 0533   HgbA1c:  Lab Results  Component Value Date   HGBA1C 5.2 11/20/2017   Urine Drug Screen:     Component Value Date/Time   LABOPIA NONE DETECTED 11/19/2017 2306   COCAINSCRNUR NONE DETECTED 11/19/2017 2306   LABBENZ NONE DETECTED 11/19/2017 2306   AMPHETMU POSITIVE (A) 11/19/2017 2306   THCU NONE DETECTED 11/19/2017 2306   LABBARB NONE DETECTED 11/19/2017 2306    Alcohol Level     Component Value Date/Time   ETH <10 11/19/2017 2055    IMAGING Ct Angio Head W Or Wo Contrast  Result Date: 11/19/2017 CLINICAL DATA:  Acute onset right-sided weakness. Last seen normal 3 hours earlier. EXAM: CT ANGIOGRAPHY HEAD AND NECK TECHNIQUE: Multidetector CT imaging of  the head and neck was performed using the standard protocol during bolus administration of intravenous contrast. Multiplanar CT image reconstructions and MIPs were obtained to evaluate the vascular anatomy. Carotid stenosis measurements (when applicable) are obtained utilizing NASCET criteria, using the distal internal carotid diameter as the denominator. CONTRAST:  22mL ISOVUE-370 IOPAMIDOL (ISOVUE-370) INJECTION 76% COMPARISON:  Head CT 11/19/2017 FINDINGS: CTA NECK FINDINGS AORTIC ARCH: There is no calcific atherosclerosis of the aortic arch. There is no aneurysm, dissection or hemodynamically significant stenosis of the visualized ascending aorta and aortic arch. Conventional 3 vessel aortic branching pattern. The visualized proximal subclavian arteries are widely patent. RIGHT CAROTID SYSTEM: --Common carotid artery: Widely patent origin without common carotid artery dissection or aneurysm. --Internal carotid artery: No dissection, occlusion or aneurysm. No hemodynamically significant stenosis. --External carotid artery: No acute abnormality. LEFT CAROTID SYSTEM: --Common carotid artery: Widely patent origin without common carotid artery dissection or aneurysm. --Internal carotid artery:No dissection, occlusion or aneurysm. No hemodynamically significant stenosis. --External carotid artery: No acute abnormality. VERTEBRAL ARTERIES: Left dominant configuration. Both origins are normal. No dissection, occlusion or flow-limiting stenosis to the  vertebrobasilar confluence. SKELETON: There is no bony spinal canal stenosis. No lytic or blastic lesion. OTHER NECK: Normal pharynx, larynx and major salivary glands. No cervical lymphadenopathy. Unremarkable thyroid gland. UPPER CHEST: No pneumothorax or pleural effusion. No nodules or masses. CTA HEAD FINDINGS ANTERIOR CIRCULATION: --Intracranial internal carotid arteries: Normal. --Anterior cerebral arteries: Normal. Both A1 segments are present. Patent anterior  communicating artery. --Middle cerebral arteries: Normal. --Posterior communicating arteries: Present bilaterally. POSTERIOR CIRCULATION: --Basilar artery: Normal. --Posterior cerebral arteries: Normal. --Superior cerebellar arteries: Normal. --Inferior cerebellar arteries: Normal anterior and posterior inferior cerebellar arteries. VENOUS SINUSES: As permitted by contrast timing, patent. ANATOMIC VARIANTS: None DELAYED PHASE: No parenchymal contrast enhancement. Review of the MIP images confirms the above findings. IMPRESSION: Normal CTA of the head and neck. Electronically Signed   By: Ulyses Jarred M.D.   On: 11/19/2017 21:15   Dg Chest 2 View  Result Date: 11/19/2017 CLINICAL DATA:  Stroke EXAM: CHEST - 2 VIEW COMPARISON:  01/30/2017 FINDINGS: Lungs are clear.  No pleural effusion or pneumothorax. The heart is normal in size. Visualized osseous structures are within normal limits. IMPRESSION: Normal chest radiographs. Electronically Signed   By: Julian Hy M.D.   On: 11/19/2017 22:14   Ct Angio Neck W Or Wo Contrast  Result Date: 11/19/2017 CLINICAL DATA:  Acute onset right-sided weakness. Last seen normal 3 hours earlier. EXAM: CT ANGIOGRAPHY HEAD AND NECK TECHNIQUE: Multidetector CT imaging of the head and neck was performed using the standard protocol during bolus administration of intravenous contrast. Multiplanar CT image reconstructions and MIPs were obtained to evaluate the vascular anatomy. Carotid stenosis measurements (when applicable) are obtained utilizing NASCET criteria, using the distal internal carotid diameter as the denominator. CONTRAST:  40mL ISOVUE-370 IOPAMIDOL (ISOVUE-370) INJECTION 76% COMPARISON:  Head CT 11/19/2017 FINDINGS: CTA NECK FINDINGS AORTIC ARCH: There is no calcific atherosclerosis of the aortic arch. There is no aneurysm, dissection or hemodynamically significant stenosis of the visualized ascending aorta and aortic arch. Conventional 3 vessel aortic branching  pattern. The visualized proximal subclavian arteries are widely patent. RIGHT CAROTID SYSTEM: --Common carotid artery: Widely patent origin without common carotid artery dissection or aneurysm. --Internal carotid artery: No dissection, occlusion or aneurysm. No hemodynamically significant stenosis. --External carotid artery: No acute abnormality. LEFT CAROTID SYSTEM: --Common carotid artery: Widely patent origin without common carotid artery dissection or aneurysm. --Internal carotid artery:No dissection, occlusion or aneurysm. No hemodynamically significant stenosis. --External carotid artery: No acute abnormality. VERTEBRAL ARTERIES: Left dominant configuration. Both origins are normal. No dissection, occlusion or flow-limiting stenosis to the vertebrobasilar confluence. SKELETON: There is no bony spinal canal stenosis. No lytic or blastic lesion. OTHER NECK: Normal pharynx, larynx and major salivary glands. No cervical lymphadenopathy. Unremarkable thyroid gland. UPPER CHEST: No pneumothorax or pleural effusion. No nodules or masses. CTA HEAD FINDINGS ANTERIOR CIRCULATION: --Intracranial internal carotid arteries: Normal. --Anterior cerebral arteries: Normal. Both A1 segments are present. Patent anterior communicating artery. --Middle cerebral arteries: Normal. --Posterior communicating arteries: Present bilaterally. POSTERIOR CIRCULATION: --Basilar artery: Normal. --Posterior cerebral arteries: Normal. --Superior cerebellar arteries: Normal. --Inferior cerebellar arteries: Normal anterior and posterior inferior cerebellar arteries. VENOUS SINUSES: As permitted by contrast timing, patent. ANATOMIC VARIANTS: None DELAYED PHASE: No parenchymal contrast enhancement. Review of the MIP images confirms the above findings. IMPRESSION: Normal CTA of the head and neck. Electronically Signed   By: Ulyses Jarred M.D.   On: 11/19/2017 21:15   Ct Head Code Stroke Wo Contrast  Result Date: 11/19/2017 CLINICAL DATA:  Code  stroke. Focal  neuro deficit, less than 6 hours, stroke suspected. Acute onset of right-sided weakness. Last seen normal 3 hours ago. EXAM: CT HEAD WITHOUT CONTRAST TECHNIQUE: Contiguous axial images were obtained from the base of the skull through the vertex without intravenous contrast. COMPARISON:  CT head without contrast 01/30/2017. FINDINGS: Brain: No acute infarct, hemorrhage, or mass lesion is present. Basal ganglia are within normal limits. Insular cortex is unremarkable. No acute or focal cortical lesion is present. The ventricles are of normal size. No significant extraaxial fluid collection is present. No significant white matter disease is present. The brainstem and cerebellum are normal. Vascular: No hyperdense vessel or unexpected calcification. Skull: Calvarium is intact. No focal lytic or blastic lesions are present. No significant extracranial soft tissue abnormalities are present. Sinuses/Orbits: The paranasal sinuses and mastoid air cells are clear. Globes and orbits are within normal limits. ASPECTS [facility] Stroke Program Early CT Score) - Ganglionic level infarction (caudate, lentiform nuclei, internal capsule, insula, M1-M3 cortex): 7/7 - Supraganglionic infarction (M4-M6 cortex): 3/3 Total score (0-10 with 10 being normal): 10/10 IMPRESSION: 1. Negative CT of the head 2. ASPECTS is 10/10 The above was relayed via text pager to Dr. Rory Percy on 11/19/2017 at 20:41 . Electronically Signed   By: San Morelle M.D.   On: 11/19/2017 20:44    PHYSICAL EXAM GENERAL: Awake, alert in NAD HEENT: - Normocephalic and atraumatic, dry mm LUNGS - Clear to auscultation bilaterally with no wheezes CV - S1S2 RRR, no m/r/g, equal pulses bilaterally. Ext: warm, well perfused, intact peripheral pulses, no edema. Bruise just below outside L knee ellow/green with purple borders.   NEURO:  Mental Status: AA&Ox3  Language: speech is not dysarthric.  Naming, repetition, fluency, and comprehension  intact. Can briskly follow 3-step commanes. Cranial Nerves: PERRL. EOMI, visual fields full, mild flattening of the right nasolabial fold, facial sensation intact, hearing intact, tongue/uvula/soft palate midline, normal sternocleidomastoid and trapezius muscle strength. No evidence of tongue atrophy or fibrillations Motor: no UE drift. 4+/5 right upper extremity some give away weakness. R leg drifts, 5-/5 RLE.  Left lower and upper extremity full-strength. Tone: is normal and bulk is normal Sensation- Intact to light touch bilaterally, no extinction Coordination: FTN intact bilaterally, no ataxia in BLE. Gait- deferred   ASSESSMENT/PLAN Jennifer Mayo is a 42 y.o. female with history of endometrial cancer, CHF, depression, narcolepsy, s/p bariatric surgery presenting with R sided weakness face and leg. She received IV tPA 11/19/2017 at 2050.   Stroke:  Possible L brain infarct vs stroke-like symptoms s/p IV tPA, workup underway  Code Stroke CT head negative ASPECTS 10.     CTA head & neck normal  MRI  pending   2D Echo  pending   LDL 72  HgbA1c 5.2  HIV pending   SCDs for VTE prophylaxis  No antithrombotic prior to admission, now on No antithrombotic as within 24h of tPA. Plan aspirin 81 if 24h imaging negative for hemorrhage  Therapy recommendations:  OP PT and OT  Disposition:  Pending. Plan return home with husband  Based on workup, will assess return to work date  Hx Hyperlipidemia  Home meds:  Not on statin currently, LDL improved after bariatric surgery and weight loss  LDL 72, goal < 70 if stroke  Consider statin based on diagnosis  Other Stroke Risk Factors  ETOH use, advised to drink no more than 1 drink(s) a day   UDS positive for amphetamines  Hx bariatric surgery 06/2016, Body mass index is 24.21  kg/m.  ? Hx Migraines - pt reports approx 3 episodes or central visual aura without HA in her past  Obstructive sleep apnea, on CPAP at home  Hx  Congestive heart failure - induced. Does have some mild diastolic dysfunction. 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 I have made any additions or clarifications directly to the above note. Pt was seen and examined.   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  Status post TPA.  Patient did complain of 5/10 headache at the time of onset and patient does have history of visual aura without headache 3 occasions in the past.  CT negative.  CTA head and neck negative.  A1c 5.2 and LDL 72.  UDS positive for amphetamine.  EF 55 to 60%.  MRI pending.  Patient also had recent history of 8-hour long car ride from [place].  Will check LE venous Doppler.  On exam patient still have mild right facial droop but otherwise normal neuro exam.  Right-sided weakness apparently resolved.  No cortical sign.  Concerning for small vessel disease, however lack of significant risk factors except OSA and history of obesity status post bypass surgery.  If MRI showed cortical stroke, patient needs TEE/loop recorder.   No antiplatelet at this time as it within 24 hours of TPA.  Added low-dose Lipitor for stroke prevention.  Will follow.  Rosalin Hawking, MD PhD Stroke Neurology 11/20/2017 8:52 PM     To contact  Stroke Continuity provider, please refer to [email or web address] After hours, contact General Neurology

## 2017-11-20 NOTE — Progress Notes (Signed)
Patient BP 82/64.  RN contacted provider on call.  New orders received  RN will continue to monitor patient

## 2017-11-20 NOTE — Progress Notes (Signed)
SLP Cancellation Note  Patient Details Name: Jennifer Mayo MRN: [id number] DOB: 17-Jul-1975   Cancelled treatment:       Reason Eval/Treat Not Completed: SLP screened, no needs identified, will sign off   [name] 11/20/2017, 12:09 PM

## 2017-11-20 NOTE — Progress Notes (Signed)
MRI brain 24h post tPA reviewed. No stroke. No acute change. Will not initiate ASA due to ulcer risk as she is a gastric bypass patient. She will need to check with her GI and PCP to see if OK to be on antiplatelet. Since this was more likely a complex migraine, do not see need to expedite ASA initiation. Stroke team to follow in the AM.  -- Amie [place], MD [facility] Pager: [phone] If 7pm to 7am, please call on call as listed on AMION.

## 2017-11-20 NOTE — Progress Notes (Signed)
Patient admitted to room 3W-05.  Patient A&O x4, no complaints, patient orientated to the room and call light within reach.  RN will continue to monitor patient

## 2017-11-20 NOTE — Progress Notes (Signed)
  Echocardiogram 2D Echocardiogram has been performed.  Jennifer Mayo 11/20/2017, 4:40 PM

## 2017-11-20 NOTE — Evaluation (Signed)
Occupational Therapy Evaluation Patient Details Name: Jennifer Mayo MRN: [id number] DOB: May 01, 1975 Today's Date: 11/20/2017    History of Present Illness Patient is a 42 y.o female presenting to the ED on 11/19/17 with R sided weakness. CT-scan of the brain: No acute changes. IV TPA administered. PMH significant for endometrial cancer, documented history of congestive heart failure, depression, narcolepsy, status post bariatric surgery.   Clinical Impression   This 42 y/o female presents with the above. At baseline pt is independent with ADLs, iADLs and functional mobility. Pt demonstrating room level functional mobility this session with overall minA (HHA); currently requires minA for LB and standing grooming ADLs, setup for seated UB ADLs. Pt demonstrating RUE weakness and reports some difficulty with word finding as well. Will benefit from continued acute OT services and recommend follow up outpatient neuro OT services to address RUE deficits and to maximize pt's overall safety and independence with ADLs and mobility after discharge home.     Follow Up Recommendations  Outpatient OT;Supervision/Assistance - 24 hour(outpatient neuro)    Equipment Recommendations  None recommended by OT           Precautions / Restrictions Precautions Precautions: Fall Restrictions Weight Bearing Restrictions: No      Mobility Bed Mobility Overal bed mobility: Modified Independent                Transfers Overall transfer level: Needs assistance Equipment used: None Transfers: Sit to/from Stand Sit to Stand: Min guard         General transfer comment: for safety and immediate standing balance    Balance Overall balance assessment: Needs assistance Sitting-balance support: Feet supported Sitting balance-Leahy Scale: Good     Standing balance support: No upper extremity supported;During functional activity Standing balance-Leahy Scale: Fair                              ADL either performed or assessed with clinical judgement   ADL Overall ADL's : Needs assistance/impaired Eating/Feeding: Independent   Grooming: Wash/dry hands;Min guard;Minimal assistance;Standing Grooming Details (indicate cue type and reason): for standing balance Upper Body Bathing: Min guard;Sitting   Lower Body Bathing: Minimal assistance;Sit to/from stand   Upper Body Dressing : Set up;Sitting   Lower Body Dressing: Minimal assistance;Sit to/from stand Lower Body Dressing Details (indicate cue type and reason): pt able to don socks seated EOB using figure 4 technique Toilet Transfer: Minimal assistance;+2 for safety/equipment;Ambulation;Regular Toilet   Toileting- Clothing Manipulation and Hygiene: Minimal assistance;Sit to/from stand       Functional mobility during ADLs: Minimal assistance;+2 for safety/equipment(pushing iv pole)       Vision Baseline Vision/History: Wears glasses Wears Glasses: At all times Patient Visual Report: No change from baseline Vision Assessment?: No apparent visual deficits Additional Comments: will continue to assess     Perception     Praxis      Pertinent Vitals/Pain Pain Assessment: No/denies pain     Hand Dominance Right   Extremity/Trunk Assessment Upper Extremity Assessment Upper Extremity Assessment: Defer to OT evaluation RUE Deficits / Details: increased weakness noted in RUE (vs L), grossly 3+/5; reports min decreased sensation    Lower Extremity Assessment Lower Extremity Assessment: RLE deficits/detail RLE Deficits / Details: noted weakness compared to L LE (grossly 4-/5) RLE Sensation: decreased light touch   Cervical / Trunk Assessment Cervical / Trunk Assessment: Normal   Communication Communication Communication: No difficulties   Cognition Arousal/Alertness: Awake/alert Behavior  During Therapy: WFL for tasks assessed/performed Overall Cognitive Status: Within Functional Limits for tasks assessed                                                       Home Living Family/patient expects to be discharged to:: Private residence Living Arrangements: Spouse/significant other Available Help at Discharge: Family Type of Home: House Home Access: Ramped entrance     [place]: Two level;Able to live on main level with bedroom/bathroom Alternate Level Stairs-Number of Steps: office is upstairs; flight of stairs  Alternate Level Stairs-Rails: Right;Left Bathroom Shower/Tub: Occupational psychologist: Standard                Prior Functioning/Environment Level of Independence: Independent        Comments: working as Designer, 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; completing iADLs including driving         OT Problem List: Decreased strength;Decreased range of motion;Decreased activity tolerance;Impaired balance (sitting and/or standing)      OT Treatment/Interventions: Self-care/ADL training;Therapeutic exercise;Neuromuscular education;DME and/or AE instruction;Therapeutic activities;Patient/family education;Balance training    OT Goals(Current goals can be found in the care plan section) Acute Rehab OT Goals Patient Stated Goal: return home OT Goal Formulation: With patient Time For Goal Achievement: 12/04/17 Potential to Achieve Goals: Good  OT Frequency: Min 2X/week   Barriers to D/C:            Co-evaluation PT/OT/SLP Co-Evaluation/Treatment: Yes Reason for Co-Treatment: For patient/therapist safety PT goals addressed during session: Mobility/safety with mobility OT goals addressed during session: ADL's and self-care;Strengthening/ROM      AM-PAC PT "6 Clicks" Daily Activity     Outcome Measure Help from another person eating meals?: None Help from another person taking care of personal grooming?: A Little Help from another person toileting, which includes using toliet, bedpan, or urinal?: A Little Help from another person bathing (including  washing, rinsing, drying)?: A Little Help from another person to put on and taking off regular upper body clothing?: None Help from another person to put on and taking off regular lower body clothing?: A Little 6 Click Score: 20   End of Session Equipment Utilized During Treatment: Gait belt Nurse Communication: Mobility status(okay per MD to work with post TPA)  Activity Tolerance: Patient tolerated treatment well Patient left: in bed;with call bell/phone within reach;with bed alarm set  OT Visit Diagnosis: Muscle weakness (generalized) (M62.81);Other symptoms and signs involving the nervous system (R29.898)                Time: 1110-1135 OT Time Calculation (min): 25 min Charges:  OT General Charges $OT Visit: 1 Visit OT Evaluation $OT Eval Moderate Complexity: 1 Mod  Lou Cal, [place] Pager [phone] 11/20/2017   [name] 11/20/2017, 2:03 PM

## 2017-11-20 NOTE — Evaluation (Signed)
Physical Therapy Evaluation Patient Details Name: Jennifer Mayo MRN: [id number] DOB: 09/28/1975 Today's Date: 11/20/2017   History of Present Illness  Patient is a 42 y.o female presenting to the ED on 11/19/17 with R sided weakness. CT-scan of the brain: No acute changes. IV TPA administered. PMH significant for endometrial cancer, documented history of congestive heart failure, depression, narcolepsy, status post bariatric surgery.    Clinical Impression  Jennifer Mayo is a very pleasant 42 y/o female admitted with the above listed diagnosis. Patient reports that prior to admission she was independent with all mobility and ADLs. Patient today requiring general min guard for safety and stability with mobility. Patient reporting slight lightheadedness with immediate standing balance, but without LOB. Of note, limited eval, as patient did receive TPA - Dr. Erlinda Hong allowing mobilization, but eval kept conservative. Will recommend outpatient PT at discharge due to R LE weakness. PT to continue to follow acutely to maximize safe functional mobility prior to d/c.    Follow Up Recommendations Outpatient PT;Supervision - Intermittent    Equipment Recommendations  None recommended by PT    Recommendations for Other Services OT consult     Precautions / Restrictions Precautions Precautions: Fall Restrictions Weight Bearing Restrictions: No      Mobility  Bed Mobility Overal bed mobility: Modified Independent                Transfers Overall transfer level: Needs assistance Equipment used: None Transfers: Sit to/from Stand Sit to Stand: Min guard         General transfer comment: for safety and immediate standing balance  Ambulation/Gait Ambulation/Gait assistance: Min guard Gait Distance (Feet): 12 Feet Assistive device: IV Pole Gait Pattern/deviations: Step-through pattern;Decreased stride length Gait velocity: decreased   General Gait Details: cautious with gait  Stairs             Wheelchair Mobility    Modified Rankin (Stroke Patients Only)       Balance Overall balance assessment: Needs assistance Sitting-balance support: Feet supported Sitting balance-Leahy Scale: Good     Standing balance support: No upper extremity supported;During functional activity Standing balance-Leahy Scale: Fair                               Pertinent Vitals/Pain Pain Assessment: No/denies pain    Home Living Family/patient expects to be discharged to:: Private residence Living Arrangements: Spouse/significant other Available Help at Discharge: Family Type of Home: House Home Access: Ramped entrance     Home Layout: Two level;Able to live on main level with bedroom/bathroom        Prior Function Level of Independence: Independent         Comments: working as Designer, 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; completing iADLs including driving      Hand Dominance   Dominant Hand: Right    Extremity/Trunk Assessment   Upper Extremity Assessment Upper Extremity Assessment: Defer to OT evaluation RUE Deficits / Details: increased weakness noted in RUE (vs L), grossly 3+/5; reports min decreased sensation     Lower Extremity Assessment Lower Extremity Assessment: RLE deficits/detail RLE Deficits / Details: noted weakness compared to L LE (grossly 4-/5) RLE Sensation: decreased light touch    Cervical / Trunk Assessment Cervical / Trunk Assessment: Normal  Communication   Communication: No difficulties  Cognition Arousal/Alertness: Awake/alert Behavior During Therapy: WFL for tasks assessed/performed Overall Cognitive Status: Within Functional Limits for tasks assessed  General Comments      Exercises     Assessment/Plan    PT Assessment Patient needs continued PT services  PT Problem List Decreased strength;Decreased range of motion;Decreased activity tolerance;Decreased balance;Decreased  mobility;Decreased safety awareness       PT Treatment Interventions DME instruction;Gait training;Stair training;Functional mobility training;Therapeutic activities;Therapeutic exercise;Balance training;Neuromuscular re-education;Patient/family education    PT Goals (Current goals can be found in the Care Plan section)  Acute Rehab PT Goals Patient Stated Goal: return home PT Goal Formulation: With patient Time For Goal Achievement: 12/04/17 Potential to Achieve Goals: Good    Frequency Min 4X/week   Barriers to discharge        Co-evaluation PT/OT/SLP Co-Evaluation/Treatment: Yes Reason for Co-Treatment: For patient/therapist safety PT goals addressed during session: Mobility/safety with mobility OT goals addressed during session: ADL's and self-care;Strengthening/ROM       AM-PAC PT "6 Clicks" Daily Activity  Outcome Measure Difficulty turning over in bed (including adjusting bedclothes, sheets and blankets)?: A Little Difficulty moving from lying on back to sitting on the side of the bed? : A Little Difficulty sitting down on and standing up from a chair with arms (e.g., wheelchair, bedside commode, etc,.)?: Unable Help needed moving to and from a bed to chair (including a wheelchair)?: A Little Help needed walking in hospital room?: A Little Help needed climbing 3-5 steps with a railing? : A Little 6 Click Score: 16    End of Session Equipment Utilized During Treatment: Gait belt Activity Tolerance: Patient tolerated treatment well Patient left: in bed;with call bell/phone within reach Nurse Communication: Mobility status PT Visit Diagnosis: Unsteadiness on feet (R26.81);Other abnormalities of gait and mobility (R26.89);Muscle weakness (generalized) (M62.81)    Time: 1110-1135 PT Time Calculation (min) (ACUTE ONLY): 25 min   Charges:   PT Evaluation $PT Eval Moderate Complexity: 1 Mod          Lanney Gins, PT, DPT 11/20/17 1:47 PM Pager:  [phone]

## 2017-11-21 ENCOUNTER — Inpatient Hospital Stay (HOSPITAL_COMMUNITY): Payer: 59

## 2017-11-21 DIAGNOSIS — F32A Depression, unspecified: Secondary | ICD-10-CM

## 2017-11-21 DIAGNOSIS — G47429 Narcolepsy in conditions classified elsewhere without cataplexy: Secondary | ICD-10-CM

## 2017-11-21 DIAGNOSIS — I639 Cerebral infarction, unspecified: Secondary | ICD-10-CM

## 2017-11-21 DIAGNOSIS — F329 Major depressive disorder, single episode, unspecified: Secondary | ICD-10-CM

## 2017-11-21 DIAGNOSIS — G43409 Hemiplegic migraine, not intractable, without status migrainosus: Principal | ICD-10-CM

## 2017-11-21 DIAGNOSIS — I2589 Other forms of chronic ischemic heart disease: Secondary | ICD-10-CM

## 2017-11-21 DIAGNOSIS — I2585 Chronic coronary microvascular dysfunction: Secondary | ICD-10-CM

## 2017-11-21 MED ORDER — ASPIRIN EC 81 MG PO TBEC: 81.0000 mg | DELAYED_RELEASE_TABLET | Freq: Every day | ORAL | 2 refills | Status: DC

## 2017-11-21 NOTE — Progress Notes (Signed)
**  Preliminary report by tech**  Bilateral lower extremity venous duplex completed. There is no evidence of deep or superficial vein thrombosis involving the right and left lower extremities. All visualized vessels appear patent and compressible. There is no evidence of Baker's cysts bilaterally.  Abram Sander 11/21/2017 9:24 AM

## 2017-11-21 NOTE — Discharge Summary (Addendum)
Stroke Discharge Summary  Patient ID: Jennifer Mayo   MRN: [id number]      DOB: 05/19/1975  Date of Admission: 11/19/2017 Date of Discharge: 11/21/2017  Attending Physician:  Rosalin Hawking, MD, Stroke MD Consultant(s):    None  Patient's PCP:  Raelyn Number, MD  DISCHARGE DIAGNOSIS:  Principal Problem:   Stroke-like episode [facility]) s/p IV tPA Active Problems:   Narcolepsy   Obstructive sleep apnea   Hemiplegic migraine   Cardiac microvascular disease   Depression  Past Medical History:  Diagnosis Date  . Anemia   . Cancer [facility])    endometrial  . Cardiac microvascular disease   . CHF (congestive heart failure) ([place])   . Depression   . History of endometrial cancer    had hysterectomy in August 2015  . Mild diastolic dysfunction   . Narcolepsy   . Obstructive sleep apnea   . Pleural effusion, bilateral    3-4 years   . Solitary pulmonary nodule on lung CT    follow up CT recommended per medical records  . Thyroid disease   . Vitamin B 12 deficiency    Past Surgical History:  Procedure Laterality Date  . ABDOMINAL HYSTERECTOMY    . CHOLECYSTECTOMY    . 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 UPPER GI ENDOSCOPY  07/01/2016   Procedure: UPPER GI ENDOSCOPY;  Surgeon: Arta Bruce Kinsinger, MD;  Location: [facility];  Service: General;;    Allergies as of 11/21/2017      Reactions   Ranexa [ranolazine] Shortness Of Breath   Nsaids    Gastric bypass - ulcer risk   Verapamil Other (See Comments)   hypotension      Medication List    STOP taking these medications   hydrocortisone 10 MG tablet Commonly known as:  CORTEF   hydrocortisone 5 MG tablet Commonly known as:  CORTEF   midodrine 10 MG tablet Commonly known as:  PROAMATINE     TAKE these  medications   aspirin EC 81 MG tablet Take 1 tablet (81 mg total) by mouth daily.   BARIATRIC MULTIVITAMINS/IRON PO Take 1 tablet by mouth 2 (two) times daily.   buPROPion 300 MG 24 hr tablet Commonly known as:  WELLBUTRIN XL Take 300 mg by mouth daily.   CALCIUM 1000 + D PO Take 1 tablet by mouth 3 (three) times daily.   FLUoxetine 40 MG capsule Commonly known as:  PROZAC Take 40 mg by mouth daily.   lisdexamfetamine 70 MG capsule Commonly known as:  VYVANSE Take 70 mg by mouth daily.   modafinil 200 MG tablet Commonly known as:  PROVIGIL Take 200 mg by mouth daily.       LABORATORY STUDIES CBC    Component Value Date/Time   WBC 5.9 11/19/2017 2018   RBC 5.14 (H) 11/19/2017 2018   HGB 15.0 11/19/2017 2031   HCT 44.0 11/19/2017 2031   PLT 182 11/19/2017 2018   MCV 87.4 11/19/2017 2018   MCH 28.0 11/19/2017 2018   MCHC 32.1 11/19/2017 2018   RDW 12.3 11/19/2017 2018   LYMPHSABS 1.9 11/19/2017 2018   MONOABS 0.4 11/19/2017 2018   EOSABS 0.1 11/19/2017 2018   BASOSABS 0.0 11/19/2017 2018   CMP    Component Value Date/Time  NA 138 11/19/2017 2031   K 3.7 11/19/2017 2031   CL 102 11/19/2017 2031   CO2 26 11/19/2017 2018   GLUCOSE 94 11/19/2017 2031   BUN 19 11/19/2017 2031   CREATININE 0.80 11/19/2017 2031   CALCIUM 9.3 11/19/2017 2018   PROT 6.2 (L) 11/19/2017 2018   ALBUMIN 3.8 11/19/2017 2018   AST 35 11/19/2017 2018   ALT 40 11/19/2017 2018   ALKPHOS 77 11/19/2017 2018   BILITOT 0.5 11/19/2017 2018   GFRNONAA >60 11/19/2017 2018   GFRAA >60 11/19/2017 2018   COAGS Lab Results  Component Value Date   INR 1.02 11/19/2017   Lipid Panel    Component Value Date/Time   CHOL 134 11/20/2017 0533   TRIG 37 11/20/2017 0533   HDL 55 11/20/2017 0533   CHOLHDL 2.4 11/20/2017 0533   VLDL 7 11/20/2017 0533   LDLCALC 72 11/20/2017 0533   HgbA1C  Lab Results  Component Value Date   HGBA1C 5.2 11/20/2017   Urinalysis    Component Value Date/Time    COLORURINE YELLOW 11/19/2017 2306   APPEARANCEUR CLEAR 11/19/2017 2306   LABSPEC 1.020 11/19/2017 2306   PHURINE 6.0 11/19/2017 2306   GLUCOSEU NEGATIVE 11/19/2017 2306   HGBUR NEGATIVE 11/19/2017 2306   BILIRUBINUR NEGATIVE 11/19/2017 2306   KETONESUR NEGATIVE 11/19/2017 2306   PROTEINUR NEGATIVE 11/19/2017 2306   NITRITE NEGATIVE 11/19/2017 2306   LEUKOCYTESUR NEGATIVE 11/19/2017 2306   Urine Drug Screen     Component Value Date/Time   LABOPIA NONE DETECTED 11/19/2017 2306   COCAINSCRNUR NONE DETECTED 11/19/2017 2306   LABBENZ NONE DETECTED 11/19/2017 2306   AMPHETMU POSITIVE (A) 11/19/2017 2306   THCU NONE DETECTED 11/19/2017 2306   LABBARB NONE DETECTED 11/19/2017 2306    Alcohol Level    Component Value Date/Time   ETH <10 11/19/2017 2055     SIGNIFICANT DIAGNOSTIC STUDIES Ct Angio Head W Or Wo Contrast  Result Date: 11/19/2017 CLINICAL DATA:  Acute onset right-sided weakness. Last seen normal 3 hours earlier. EXAM: CT ANGIOGRAPHY HEAD AND NECK TECHNIQUE: Multidetector CT imaging of the head and neck was performed using the standard protocol during bolus administration of intravenous contrast. Multiplanar CT image reconstructions and MIPs were obtained to evaluate the vascular anatomy. Carotid stenosis measurements (when applicable) are obtained utilizing NASCET criteria, using the distal internal carotid diameter as the denominator. CONTRAST:  35mL ISOVUE-370 IOPAMIDOL (ISOVUE-370) INJECTION 76% COMPARISON:  Head CT 11/19/2017 FINDINGS: CTA NECK FINDINGS AORTIC ARCH: There is no calcific atherosclerosis of the aortic arch. There is no aneurysm, dissection or hemodynamically significant stenosis of the visualized ascending aorta and aortic arch. Conventional 3 vessel aortic branching pattern. The visualized proximal subclavian arteries are widely patent. RIGHT CAROTID SYSTEM: --Common carotid artery: Widely patent origin without common carotid artery dissection or aneurysm.  --Internal carotid artery: No dissection, occlusion or aneurysm. No hemodynamically significant stenosis. --External carotid artery: No acute abnormality. LEFT CAROTID SYSTEM: --Common carotid artery: Widely patent origin without common carotid artery dissection or aneurysm. --Internal carotid artery:No dissection, occlusion or aneurysm. No hemodynamically significant stenosis. --External carotid artery: No acute abnormality. VERTEBRAL ARTERIES: Left dominant configuration. Both origins are normal. No dissection, occlusion or flow-limiting stenosis to the vertebrobasilar confluence. SKELETON: There is no bony spinal canal stenosis. No lytic or blastic lesion. OTHER NECK: Normal pharynx, larynx and major salivary glands. No cervical lymphadenopathy. Unremarkable thyroid gland. UPPER CHEST: No pneumothorax or pleural effusion. No nodules or masses. CTA HEAD FINDINGS ANTERIOR CIRCULATION: --Intracranial internal carotid  arteries: Normal. --Anterior cerebral arteries: Normal. Both A1 segments are present. Patent anterior communicating artery. --Middle cerebral arteries: Normal. --Posterior communicating arteries: Present bilaterally. POSTERIOR CIRCULATION: --Basilar artery: Normal. --Posterior cerebral arteries: Normal. --Superior cerebellar arteries: Normal. --Inferior cerebellar arteries: Normal anterior and posterior inferior cerebellar arteries. VENOUS SINUSES: As permitted by contrast timing, patent. ANATOMIC VARIANTS: None DELAYED PHASE: No parenchymal contrast enhancement. Review of the MIP images confirms the above findings. IMPRESSION: Normal CTA of the head and neck. Electronically Signed   By: Ulyses Jarred M.D.   On: 11/19/2017 21:15   Dg Chest 2 View  Result Date: 11/19/2017 CLINICAL DATA:  Stroke EXAM: CHEST - 2 VIEW COMPARISON:  01/30/2017 FINDINGS: Lungs are clear.  No pleural effusion or pneumothorax. The heart is normal in size. Visualized osseous structures are within normal limits. IMPRESSION:  Normal chest radiographs. Electronically Signed   By: Julian Hy M.D.   On: 11/19/2017 22:14   Ct Angio Neck W Or Wo Contrast  Result Date: 11/19/2017 CLINICAL DATA:  Acute onset right-sided weakness. Last seen normal 3 hours earlier. EXAM: CT ANGIOGRAPHY HEAD AND NECK TECHNIQUE: Multidetector CT imaging of the head and neck was performed using the standard protocol during bolus administration of intravenous contrast. Multiplanar CT image reconstructions and MIPs were obtained to evaluate the vascular anatomy. Carotid stenosis measurements (when applicable) are obtained utilizing NASCET criteria, using the distal internal carotid diameter as the denominator. CONTRAST:  64mL ISOVUE-370 IOPAMIDOL (ISOVUE-370) INJECTION 76% COMPARISON:  Head CT 11/19/2017 FINDINGS: CTA NECK FINDINGS AORTIC ARCH: There is no calcific atherosclerosis of the aortic arch. There is no aneurysm, dissection or hemodynamically significant stenosis of the visualized ascending aorta and aortic arch. Conventional 3 vessel aortic branching pattern. The visualized proximal subclavian arteries are widely patent. RIGHT CAROTID SYSTEM: --Common carotid artery: Widely patent origin without common carotid artery dissection or aneurysm. --Internal carotid artery: No dissection, occlusion or aneurysm. No hemodynamically significant stenosis. --External carotid artery: No acute abnormality. LEFT CAROTID SYSTEM: --Common carotid artery: Widely patent origin without common carotid artery dissection or aneurysm. --Internal carotid artery:No dissection, occlusion or aneurysm. No hemodynamically significant stenosis. --External carotid artery: No acute abnormality. VERTEBRAL ARTERIES: Left dominant configuration. Both origins are normal. No dissection, occlusion or flow-limiting stenosis to the vertebrobasilar confluence. SKELETON: There is no bony spinal canal stenosis. No lytic or blastic lesion. OTHER NECK: Normal pharynx, larynx and major  salivary glands. No cervical lymphadenopathy. Unremarkable thyroid gland. UPPER CHEST: No pneumothorax or pleural effusion. No nodules or masses. CTA HEAD FINDINGS ANTERIOR CIRCULATION: --Intracranial internal carotid arteries: Normal. --Anterior cerebral arteries: Normal. Both A1 segments are present. Patent anterior communicating artery. --Middle cerebral arteries: Normal. --Posterior communicating arteries: Present bilaterally. POSTERIOR CIRCULATION: --Basilar artery: Normal. --Posterior cerebral arteries: Normal. --Superior cerebellar arteries: Normal. --Inferior cerebellar arteries: Normal anterior and posterior inferior cerebellar arteries. VENOUS SINUSES: As permitted by contrast timing, patent. ANATOMIC VARIANTS: None DELAYED PHASE: No parenchymal contrast enhancement. Review of the MIP images confirms the above findings. IMPRESSION: Normal CTA of the head and neck. Electronically Signed   By: Ulyses Jarred M.D.   On: 11/19/2017 21:15   Mr Brain Wo Contrast  Result Date: 11/20/2017 CLINICAL DATA:  Right upper extremity weakness. History of endometrial cancer. EXAM: MRI HEAD WITHOUT CONTRAST TECHNIQUE: Multiplanar, multiecho pulse sequences of the brain and surrounding structures were obtained without intravenous contrast. COMPARISON:  CTA head neck 11/19/2017 FINDINGS: BRAIN: There is no acute infarct, acute hemorrhage or mass effect. The midline structures are normal. There are no  old infarcts. The white matter signal is normal for the patient's age. The CSF spaces are normal for age, with no hydrocephalus. Susceptibility-sensitive sequences show no chronic microhemorrhage or superficial siderosis. VASCULAR: Major intracranial arterial and venous sinus flow voids are preserved. SKULL AND UPPER CERVICAL SPINE: The visualized skull base, calvarium, upper cervical spine and extracranial soft tissues are normal. SINUSES/ORBITS: No fluid levels or advanced mucosal thickening. No mastoid or middle ear  effusion. The orbits are normal. IMPRESSION: Normal brain MRI. Electronically Signed   By: Ulyses Jarred M.D.   On: 11/20/2017 22:05   Ct Head Code Stroke Wo Contrast  Result Date: 11/19/2017 CLINICAL DATA:  Code stroke. Focal neuro deficit, less than 6 hours, stroke suspected. Acute onset of right-sided weakness. Last seen normal 3 hours ago. EXAM: CT HEAD WITHOUT CONTRAST TECHNIQUE: Contiguous axial images were obtained from the base of the skull through the vertex without intravenous contrast. COMPARISON:  CT head without contrast 01/30/2017. FINDINGS: Brain: No acute infarct, hemorrhage, or mass lesion is present. Basal ganglia are within normal limits. Insular cortex is unremarkable. No acute or focal cortical lesion is present. The ventricles are of normal size. No significant extraaxial fluid collection is present. No significant white matter disease is present. The brainstem and cerebellum are normal. Vascular: No hyperdense vessel or unexpected calcification. Skull: Calvarium is intact. No focal lytic or blastic lesions are present. No significant extracranial soft tissue abnormalities are present. Sinuses/Orbits: The paranasal sinuses and mastoid air cells are clear. Globes and orbits are within normal limits. ASPECTS [facility] Stroke Program Early CT Score) - Ganglionic level infarction (caudate, lentiform nuclei, internal capsule, insula, M1-M3 cortex): 7/7 - Supraganglionic infarction (M4-M6 cortex): 3/3 Total score (0-10 with 10 being normal): 10/10 IMPRESSION: 1. Negative CT of the head 2. ASPECTS is 10/10 The above was relayed via text pager to Dr. Rory Percy on 11/19/2017 at 20:41 . Electronically Signed   By: San Morelle M.D.   On: 11/19/2017 20:44    TEE - Left ventricle: The cavity size was normal. Systolic function was normal. The estimated ejection fraction was in the range of 55% to 60%. Wall motion was normal; there were no regional wall motion abnormalities. Impressions:  No  cardiac source of embolism was identified, but cannot be ruled out on the basis of this examination.  LE doppler There is no evidence of deep or superficial vein thrombosis involving the right and left lower extremities. All visualized vessels appear patent and compressible. There is no evidence of Baker's cysts bilaterally.     HISTORY OF PRESENT ILLNESS Jennifer Mayo is a 42 y.o. 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  She did not make much of it but her weakness started to get worse with inability to push the door from the right hand.  She came in for evaluation to the emergency room and waited in the lobby to be triaged and when the triage nurse first examined her, she noticed some right-sided weakness and immediately on first contact initiated code stroke. On initial neuro examination, the patient was an NIH stroke scale of 3 for a mild right upper extremity, right lower  extremity and right lower facial weakness. She also complained of a minor headache.  Does not have a history of migraines.  Has not had migraines as a young adult as well. Dr. Rory Percy discussed with her the possibility of this being a complex migraine versus stroke and her being within the window for IV thrombolysis the risks and benefits of IV TPA.  He also discussed this with her husband in detail. Given that symptoms involved her dominant side, even with a low NIH stroke scale, she and her husband agreed to pursue IV TPA. She was admitted to the stroke unit for further evaluation and care. Premorbid modified Rankin scale (mRS):0    HOSPITAL COURSE Ms. Jennifer Mayo  is a 42 y.o. female with history of endometrial cancer, CHF, depression, narcolepsy, s/p bariatric surgery presenting with R sided weakness face and leg. She received IV tPA 11/19/2017 at 2050. MRI was negative for acute stroke. She recalled 3 episodes through her life with visual aura consistent with migraine aura; often not followed by HA. Given neg MRI and hx migraines, dx most consistent with hemiplegic migraine. In hospital, continues to improved, almost back to baseline. OP PT recommended. Pt d/c on aspirin for stroke prevention.   Stroke-like episode s/p IV tPA Hemiplegic migraine  Code Stroke CT head negative ASPECTS 10.     CTA head & neck normal  MRI  no acute stroke  2D Echo  EF 55-60%. No source of embolus   LE doppler neg (recent 8h car ride)  LDL 72  HgbA1c 5.2  HIV negative  No antithrombotic prior to admission, post tPA not given aspirin d/t hx gastric bypass. Discharged on aspirin 81 mg daily for stroke prevention  Therapy recommendations:  OP PT   Disposition:  home with husband  Return to work based on progress with therapy  Hx Hyperlipidemia  Home meds:  Not on statin currently, LDL improved after bariatric surgery and weight loss  LDL 72, at goal < 100 for non-stroke dx  Other Stroke Risk Factors  ETOH use, advised to drink no more than 1 drink(s) a day   UDS positive for amphetamines c/w know med hx  Hx bariatric surgery 06/2016, Body mass index is 24.21 kg/m.  Hx Migraines - pt reports approx 3 episodes or central visual aura without HA in her past  Obstructive sleep apnea, on CPAP at home  Coronary microvascular disease  Hx Congestive heart failure - induced. Does have some mild diastolic dysfunction. CHF occurred after fast infusion of 3-4L NS in setting of hypotension  Other Active Problems  Narcolepsy on Provigil  Depression on Wellbutrin, Prozac and Vyvance - takes routinely - not missed doses or taking extra  Hx endometrial  cancer   DISCHARGE EXAM Blood pressure 116/69, pulse 72, temperature 97.8 F (36.6 C), temperature source Oral, resp. rate 20, height 5\' 6"  (1.676 m), weight 68 kg (150 lb), SpO2 98 %. GENERAL: Awake, alert in NAD HEENT: - Normocephalic and atraumatic, dry mm LUNGS - Clear to auscultation bilaterally with no wheezes CV - S1S2 RRR, no m/r/g, equal pulses bilaterally. Ext: warm, well perfused, intact peripheral pulses,no edema. Bruise just below outside L knee ellow/green with purple borders.   NEURO:  Mental Status: AA&Ox3  Language: speech isnot dysarthric. Naming, repetition, fluency, and comprehension intact. Can briskly follow 3-step commanes. Cranial Nerves: PERRL. EOMI, visual fields full,improved flattening of the right nasolabial fold from day prior ? baseline,facial sensation intact, hearing intact, tongue/uvula/soft palate midline, normal sternocleidomastoid and  trapezius muscle strength. No evidence of tongue atrophy or fibrillations Motor:no UE drift. 5/5 right upper extremity. 5/5 RLE. Left lower and upper extremity full-strength. Tone: is normal and bulk is normal Sensation- Intact to light touch bilaterally, no extinction Coordination: FTN intact bilaterally, no ataxia in BLE. Gait- deferred  Discharge Diet    Diet Order           Diet Heart Room service appropriate? Yes; Fluid consistency: Thin  Diet effective now         liquids  DISCHARGE PLAN  Disposition:  Home  Outpatient PT  aspirin 81 mg daily for stroke prevention.  Ongoing risk factor control by Primary Care Physician at time of discharge  Follow-up Raelyn Number, MD in 2 weeks.  Follow-up in [facility] with Dr. Jaynee Eagles, [facility] specialist, in 4 weeks, office to schedule an appointment.   40 minutes were spent preparing discharge.  Burnetta Sabin, MSN, APRN, ANVP-BC, AGPCNP-BC Advanced Practice Stroke Nurse [place] for Schedule &  Pager information 11/21/2017 2:12 PM    ATTENDING NOTE: I reviewed above note and agree with the assessment and plan. I have made any additions or clarifications directly to the above note. Pt was seen and examined.   Patient neuro stable, no significant change.  Still has subtle right nasolabial fold flattening, and minimal headache.  Patient stated that when she started to walk she felt some heaviness on the right leg.  However, MRI negative for acute stroke.  Her condition could be due to complicated migraine.  Although DWI negative brainstem stroke cannot be ruled out, but unlikely as she does not have any other brainstem symptoms.  I also asked patient to check her old photos at home to see whether right nasolabial fold flattening is her baseline.  I also asked her to call EMS or go to nearest ED if similar episode happens again.  Recommend aspirin 81 for stroke prevention.  She will follow with [facility] in 4 weeks.  Rosalin Hawking, MD PhD Stroke Neurology 11/21/2017 9:35 PM

## 2017-11-21 NOTE — Progress Notes (Signed)
Occupational Therapy Treatment Patient Details Name: Jennifer Mayo MRN: [id number] DOB: Feb 01, 1976 Today's Date: 11/21/2017    History of present illness Patient is a 42 y.o female presenting to the ED on 11/19/17 with R sided weakness. CT-scan of the brain: No acute changes. IV TPA administered. PMH significant for endometrial cancer, documented history of congestive heart failure, depression, narcolepsy, status post bariatric surgery.   OT comments  Pt presents supine in bed agreeable to OT tx session. Pt completed room and hallway level functional mobility without AD and overall minguard assist; demonstrates LB and standing grooming ADLs with minguard assist. Pt reports feeling slightly weaker/fatigued compared to her baseline but overall demonstrating improvements in RUE strength and mobility this session. Issued and reviewed general RUE FMC/strengthening HEP for use after discharge. Based upon pt's improvements feel she will not require follow up OT services. Will continue to follow while she remains in acute setting to maximize her safety and independence with ADLs and mobility prior to return home.    Follow Up Recommendations  No OT follow up;Supervision/Assistance - 24 hour(24hr initially )    Equipment Recommendations  None recommended by OT          Precautions / Restrictions Precautions Precautions: Fall Restrictions Weight Bearing Restrictions: No       Mobility Bed Mobility Overal bed mobility: Modified Independent                Transfers Overall transfer level: Needs assistance Equipment used: None Transfers: Sit to/from Stand Sit to Stand: Supervision         General transfer comment: for safety and immediate standing balance    Balance Overall balance assessment: Needs assistance Sitting-balance support: Feet supported Sitting balance-Leahy Scale: Good     Standing balance support: No upper extremity supported;During functional activity Standing  balance-Leahy Scale: Fair Standing balance comment: minguard for mobility                            ADL either performed or assessed with clinical judgement   ADL Overall ADL's : Needs assistance/impaired     Grooming: Wash/dry hands;Wash/dry face;Oral care;Min guard;Supervision/safety;Standing           Upper Body Dressing : Set up;Sitting Upper Body Dressing Details (indicate cue type and reason): doffing/donning new gown  Lower Body Dressing: Supervision/safety;Min guard;Sit to/from stand Lower Body Dressing Details (indicate cue type and reason): pt able to don socks seated EOB using figure 4 technique             Functional mobility during ADLs: Min guard General ADL Comments: pt reports some minimal weakness and fatigue, but with improved strength this session compared to previous session      Vision       Perception     Praxis      Cognition Arousal/Alertness: Awake/alert Behavior During Therapy: WFL for tasks assessed/performed Overall Cognitive Status: Within Functional Limits for tasks assessed                                          Exercises Other Exercises Other Exercises: educated on general fine motor/coordination strengthening exercises/activities and handout provided    Shoulder Instructions       General Comments      Pertinent Vitals/ Pain       Pain Assessment: No/denies pain  Home Living  Prior Functioning/Environment              Frequency  Min 2X/week        Progress Toward Goals  OT Goals(current goals can now be found in the care plan section)  Progress towards OT goals: Progressing toward goals  Acute Rehab OT Goals Patient Stated Goal: return home OT Goal Formulation: With patient Time For Goal Achievement: 12/04/17 Potential to Achieve Goals: Good  Plan Discharge plan needs to be updated    Co-evaluation                  AM-PAC PT "6 Clicks" Daily Activity     Outcome Measure   Help from another person eating meals?: None Help from another person taking care of personal grooming?: None Help from another person toileting, which includes using toliet, bedpan, or urinal?: A Little Help from another person bathing (including washing, rinsing, drying)?: A Little Help from another person to put on and taking off regular upper body clothing?: None Help from another person to put on and taking off regular lower body clothing?: A Little 6 Click Score: 21    End of Session Equipment Utilized During Treatment: Gait belt  OT Visit Diagnosis: Muscle weakness (generalized) (M62.81);Other symptoms and signs involving the nervous system (R29.898)   Activity Tolerance Patient tolerated treatment well   Patient Left in bed;with call bell/phone within reach   Nurse Communication Mobility status        Time: 4503-8882 OT Time Calculation (min): 20 min  Charges: OT General Charges $OT Visit: 1 Visit OT Treatments $Self Care/Home Management : 8-22 mins  Jennifer Mayo, OT Pager [phone] 11/21/2017    Jennifer Mayo 11/21/2017, 11:35 AM

## 2017-11-21 NOTE — Progress Notes (Signed)
Physical Therapy Treatment Patient Details Name: Jennifer Mayo MRN: [id number] DOB: 06-01-1975 Today's Date: 11/21/2017    History of Present Illness Patient is a 42 y.o female presenting to the ED on 11/19/17 with R sided weakness. CT-scan of the brain: No acute changes. IV TPA administered. PMH significant for endometrial cancer, documented history of congestive heart failure, depression, narcolepsy, status post bariatric surgery.    PT Comments    Pt with great improvement in mobility. Pt notes her RLE feels 'heavy.' She demonstrates modified independence with bed mobility. Supervision provided for transfers and ambulation 350 feet without AD. Noted occasional use of handrail in hallway due to fatigue. No physical assist needed and no LOB noted. Stairs assessed with pt ascend/descend 5 steps with rail supervision. Discharge recommendations remain appropriate.    Follow Up Recommendations  Outpatient PT;Supervision - Intermittent     Equipment Recommendations  None recommended by PT    Recommendations for Other Services       Precautions / Restrictions Precautions Precautions: Fall Restrictions Weight Bearing Restrictions: No    Mobility  Bed Mobility Overal bed mobility: Modified Independent                Transfers Overall transfer level: Needs assistance Equipment used: None Transfers: Sit to/from Stand Sit to Stand: Supervision         General transfer comment: supervision for safety, no physical assist, no LOB noted  Ambulation/Gait Ambulation/Gait assistance: Supervision Gait Distance (Feet): 350 Feet Assistive device: None Gait Pattern/deviations: Step-through pattern;Decreased stride length Gait velocity: decreased Gait velocity interpretation: 1.31 - 2.62 ft/sec, indicative of limited community ambulator General Gait Details: mild steppage gait RLE with overcompensated hip and dorsiflexion, decreased arm swing and trunk rotation   Stairs Stairs:  Yes Stairs assistance: Supervision Stair Management: One rail Right;Forwards;Step to pattern Number of Stairs: 5 General stair comments: Pt demo good technique. No verbal cues required.   Wheelchair Mobility    Modified Rankin (Stroke Patients Only)       Balance Overall balance assessment: Needs assistance Sitting-balance support: Feet supported Sitting balance-Leahy Scale: Good     Standing balance support: No upper extremity supported;During functional activity Standing balance-Leahy Scale: Fair Standing balance comment: minguard for mobility                             Cognition Arousal/Alertness: Awake/alert Behavior During Therapy: WFL for tasks assessed/performed Overall Cognitive Status: Within Functional Limits for tasks assessed                                        Exercises Other Exercises Other Exercises: educated on general fine motor/coordination strengthening exercises/activities and handout provided     General Comments        Pertinent Vitals/Pain Pain Assessment: No/denies pain    Home Living                      Prior Function            PT Goals (current goals can now be found in the care plan section) Acute Rehab PT Goals Patient Stated Goal: return home PT Goal Formulation: With patient Time For Goal Achievement: 12/04/17 Potential to Achieve Goals: Good Progress towards PT goals: Progressing toward goals    Frequency    Min 4X/week      PT  Plan Current plan remains appropriate    Co-evaluation              AM-PAC PT "6 Clicks" Daily Activity  Outcome Measure  Difficulty turning over in bed (including adjusting bedclothes, sheets and blankets)?: None Difficulty moving from lying on back to sitting on the side of the bed? : None Difficulty sitting down on and standing up from a chair with arms (e.g., wheelchair, bedside commode, etc,.)?: None Help needed moving to and from a bed  to chair (including a wheelchair)?: None Help needed walking in hospital room?: None Help needed climbing 3-5 steps with a railing? : A Little 6 Click Score: 23    End of Session Equipment Utilized During Treatment: Gait belt Activity Tolerance: Patient tolerated treatment well Patient left: in bed;with call bell/phone within reach;with family/visitor present Nurse Communication: Mobility status PT Visit Diagnosis: Unsteadiness on feet (R26.81);Other abnormalities of gait and mobility (R26.89);Muscle weakness (generalized) (M62.81)     Time: 3291-9166 PT Time Calculation (min) (ACUTE ONLY): 25 min  Charges:  $Gait Training: 23-37 mins                     Jennifer Mayo, [place]  Office # [phone] Pager [phone]    Jennifer Mayo 11/21/2017, 12:53 PM

## 2017-11-21 NOTE — Care Management Note (Signed)
Case Management Note  Patient Details  Name: Jennifer Mayo MRN: [id number] Date of Birth: [birth year]  Subjective/Objective:      Pt admitted with stroke. She is from home with her spouse and was IADL.               Action/Plan: Pt discharging home with self care. CM consulted for outpatient therapy. CM met with the patient and she would like to attend [facility]. Orders in Epic and information on the AVS. Spouse able to provide intermittent supervision at home and transportation to home.   Expected Discharge Date:  11/21/17               Expected Discharge Plan:  OP Rehab  In-House Referral:     Discharge planning Services  CM Consult  Post Acute Care Choice:    Choice offered to:     DME Arranged:    DME Agency:     HH Arranged:    HH Agency:     Status of Service:  Completed, signed off  If discussed at [employer] of Stay Meetings, dates discussed:    Additional Comments:  Pollie Friar, RN 11/21/2017, 2:33 PM

## 2017-11-21 NOTE — Plan of Care (Signed)
Jennifer Mayo is able to ambulate with only guarded assistance.  Residual "heaviness" in the right side is slowly improving, and she is ready to go home.  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    Jennifer Mayo will be discharged to home today with outpatient therapy.

## 2017-11-24 ENCOUNTER — Other Ambulatory Visit: Payer: Self-pay | Admitting: *Deleted

## 2017-11-24 NOTE — Patient Outreach (Signed)
[place] [facility]) Care Management  11/24/2017  Jennifer Mayo 1976/02/27 [id number]   Subjective: Telephone call to patient's home  / mobile number, no answer, left HIPAA compliant voicemail message, and requested call back.    Objective: Per KPN (Knowledge Performance Now, point of care tool) and chart review, patient hospitalized 11/19/17 -11/21/17 for stroke like episode.   Patient also has a history of Narcolepsy, Obstructive sleep apnea, Hemiplegic migraine, Cardiac microvascular disease,  Anemia, endometrial cancer, CHF (congestive heart failure), Solitary pulmonary nodule on lung CT, and Vitamin B 12 deficiency.     Assessment: Received [facility] Transition of care referral on 11/24/17.  Transition of care follow up pending patient contact.       Plan: RNCM will send unsuccessful outreach  letter, [facility] pamphlet, will call patient for 2nd telephone outreach attempt, transition of care follow up, and proceed with case closure, within 10 business days if no return call.       Jennifer Mayo H. Annia Friendly, BSN, [place] Management [facility] Telephonic CM Phone: [phone] Fax: [phone]

## 2017-11-25 ENCOUNTER — Other Ambulatory Visit: Payer: Self-pay | Admitting: *Deleted

## 2017-11-25 NOTE — Patient Outreach (Signed)
[place] [facility]) Care Management  11/25/2017  Jennifer Mayo [birth year] [id number]   Subjective: Telephone call to patient's home  / mobile number, no answer, left HIPAA compliant voicemail message, and requested call back.    Objective: Per KPN (Knowledge Performance Now, point of care tool) and chart review, patient hospitalized 11/19/17 -11/21/17 for stroke like episode.   Patient also has a history of Narcolepsy, Obstructive sleep apnea, Hemiplegic migraine, Cardiac microvascular disease,  Anemia, endometrial cancer, CHF (congestive heart failure), Solitary pulmonary nodule on lung CT, and Vitamin B 12 deficiency.     Assessment: Received [facility]MR Transition of care referral on 11/24/17.  Transition of care follow up pending patient contact.       Plan: RNCM has sent unsuccessful outreach  letter, [facility] pamphlet, will call patient for 3rd telephone outreach attempt, transition of care follow up, and proceed with case closure, within 10 business days if no return call.      Jennifer Mayo H. Annia Friendly, BSN, [place] Management [facility] Telephonic CM Phone: [phone] Fax: [phone]

## 2017-11-26 ENCOUNTER — Other Ambulatory Visit: Payer: Self-pay | Admitting: *Deleted

## 2017-11-26 NOTE — Patient Outreach (Signed)
[facility]) Care Management  11/26/2017  Jennifer Mayo [birth year] [id number]   Subjective:Telephone call to patient's home / mobile number, no answer, left HIPAA compliant voicemail message, and requested call back.    Objective:Per KPN (Knowledge Performance Now, point of care tool) and chart review,patient hospitalized 11/19/17 -11/21/17 for stroke like episode. Patient also has a history of Narcolepsy,Obstructive sleep apnea,Hemiplegic migraine,Cardiac microvascular disease,Anemia,endometrialcancer,CHF (congestive heart failure),Solitary pulmonary nodule on lung CT, andVitamin B 12 deficiency.     Assessment: Received [facility]MR Transition of care referral on 11/24/17.Transition of care follow up pending patient contact.      Plan:RNCM has sent unsuccessful outreach letter, [facility] pamphlet, and will proceed with case closure, within 10 business days if no return call.      Jennifer Mayo H. Annia Friendly, BSN, [place] Management [facility] Telephonic CM Phone: [phone] Fax: [phone]

## 2017-11-27 ENCOUNTER — Ambulatory Visit: Payer: 59 | Attending: Internal Medicine | Admitting: Rehabilitation

## 2017-11-27 ENCOUNTER — Encounter: Payer: Self-pay | Admitting: Rehabilitation

## 2017-11-27 ENCOUNTER — Telehealth: Payer: Self-pay | Admitting: Rehabilitation

## 2017-11-27 DIAGNOSIS — G8191 Hemiplegia, unspecified affecting right dominant side: Secondary | ICD-10-CM | POA: Diagnosis not present

## 2017-11-27 DIAGNOSIS — R2689 Other abnormalities of gait and mobility: Secondary | ICD-10-CM | POA: Diagnosis not present

## 2017-11-27 DIAGNOSIS — R2681 Unsteadiness on feet: Secondary | ICD-10-CM | POA: Insufficient documentation

## 2017-11-27 DIAGNOSIS — R293 Abnormal posture: Secondary | ICD-10-CM | POA: Insufficient documentation

## 2017-11-27 NOTE — Telephone Encounter (Signed)
Dr. Burt Ek,   I just evaluated Mrs. Jennifer Mayo at [facility] OP neuro for PT.  Note that she sees you tomorrow in her office hoping to get into see a neuro MD sooner than Oct.  Upon evaluation, she has increased RUE weakness and sensory changes that is affecting ability to complete ADLs and work tasks therefore would recommend an OT evaluation.  She is going to ask you as well tomorrow. If you agree, send order for OT with patient or write order in Epic work que for OT evaluation and we can get that scheduled next time she is in here.    Thanks,  Cameron Sprang, PT, MPT [facility] [address] [place], [place], [address] Phone: [phone]   Fax:  [phone] 11/27/17, 10:03 AM

## 2017-11-27 NOTE — Therapy (Signed)
[facility] [address] [place], [place], [address] Phone: [phone]   Fax:  [phone]  Physical Therapy Evaluation  Patient Details  Name: Jennifer Mayo MRN: [id number] Date of Birth: Aug 13, 1975 Referring Provider: Ferd Hibbs MD   Encounter Date: 11/27/2017  PT End of Session - 11/27/17 0941    Visit Number  1    Number of Visits  17    Date for PT Re-Evaluation  01/26/18    Authorization Type  [facility] [place]     PT Start Time  0845    PT Stop Time  0930    PT Time Calculation (min)  45 min    Activity Tolerance  Patient tolerated treatment well    Behavior During Therapy  [facility] for tasks assessed/performed       Past Medical History:  Diagnosis Date  . Anemia   . Cancer [facility])    endometrial  . Cardiac microvascular disease   . CHF (congestive heart failure) ([place])   . Depression   . History of endometrial cancer    had hysterectomy in August 2015  . Mild diastolic dysfunction   . Narcolepsy   . Obstructive sleep apnea   . Pleural effusion, bilateral    3-4 years   . Solitary pulmonary nodule on lung CT    follow up CT recommended per medical records  . Thyroid disease   . Vitamin B 12 deficiency     Past Surgical History:  Procedure Laterality Date  . ABDOMINAL HYSTERECTOMY    . CHOLECYSTECTOMY    . 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 UPPER GI ENDOSCOPY  07/01/2016   Procedure: UPPER GI ENDOSCOPY;  Surgeon: Arta Bruce Kinsinger, MD;  Location: [facility];  Service: General;;    There were no vitals filed for this visit.   Subjective Assessment - 11/27/17 0850    Subjective  "They still are unsure if this was a stroke or a migraine.  I don't feel like it is a migraine  because I still have R sided weakness and I feel like my arm is still getting weaker."     Pertinent History  microvascular cardiac disease (arteries spasm when HR goes up), depression, endometrial cancer    Limitations  House hold activities;Walking    How long can you walk comfortably?  notices R LE weakness within 30 secs, esp as day increases    Patient Stated Goals  "increase strength in R arm and leg"     Currently in Pain?  Yes    Pain Score  3     Pain Location  Head    Pain Descriptors / Indicators  Headache    Pain Type  Acute pain    Pain Onset  1 to 4 weeks ago    Pain Frequency  Intermittent    Aggravating Factors   increases throughout the day    Pain Relieving Factors  rest          [facility] PT Assessment - 11/27/17 0857      Assessment   Medical Diagnosis  possible CVA    Referring Provider  Ferd Hibbs MD    Onset Date/Surgical Date  11/19/17    Prior Therapy  Acute PT/OT      Balance Screen  Has the patient fallen in the past 6 months  No    Has the patient had a decrease in activity level because of a fear of falling?   Yes    Is the patient reluctant to leave their home because of a fear of falling?   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    [place] activities, taking kids to activities, swim, read       Cognition   Overall Cognitive Status  Within Functional Limits for tasks assessed      Sensation   Light Touch  Impaired Detail     Light Touch Impaired Details  Impaired RUE;Impaired RLE    Hot/Cold  Appears Intact    Proprioception  Appears Intact      Coordination   Gross Motor Movements are Fluid and Coordinated  Yes    Fine Motor Movements are Fluid and Coordinated  Yes    Heel Shin Test  no dysmetria, some weakness noted       ROM / Strength   AROM / PROM / Strength  Strength      Strength   Overall Strength  Deficits    Overall Strength Comments  LLE WFL, R hip flex (seated) 3-/5, R knee ext 3+/5, R knee flex 4/5, R ankle DF 3+/5, R ankle PF 4/5 (standing heel raise)      Transfers   Transfers  Sit to Stand;Stand to Sit    Sit to Stand  6: Modified independent (Device/Increase time)    Five time sit to stand comments   12.63 secs with increased weight shift to L side.     Stand to Sit  6: Modified independent (Device/Increase time)      Ambulation/Gait   Ambulation/Gait  Yes    Ambulation/Gait Assistance  5: Supervision;6: Modified independent (Device/Increase time)    Ambulation/Gait Assistance Details  Pt tends to overcompensate to avoid tripping on RLE with overt R ankle DF and hip and knee flex    Ambulation Distance (Feet)  400 Feet    Assistive device  None    Gait Pattern  Step-through pattern;Lateral hip instability;Narrow base of support;Right steppage    Ambulation Surface  Level;Indoor    Gait velocity  3.26 ft/sec without device.  Pt notably increases R hip/knee flex along with DF during gait.     Stairs  Yes    Stairs Assistance  6: Modified independent (Device/Increase time)    Stair Management Technique  Two rails;Step to pattern;Forwards    Number of Stairs  4    Height of Stairs  6      Functional Gait  Assessment   Gait assessed   Yes    Gait Level Surface  Walks 20 ft in less than 7 sec but greater than 5.5  sec, uses assistive device, slower speed, mild gait deviations, or deviates 6-10 in outside of the 12 in walkway width.    Change in Gait Speed  Able to change speed,  demonstrates mild gait deviations, deviates 6-10 in outside of the 12 in walkway width, or no gait deviations, unable to achieve a major change in velocity, or uses a change in velocity, or uses an assistive device.    Gait with Horizontal Head Turns  Performs head turns with moderate changes in gait velocity, slows down, deviates 10-15 in outside 12 in walkway width but recovers, can continue to walk.    Gait with Vertical Head Turns  Performs task with slight change in gait velocity (eg, minor disruption to smooth gait path), deviates 6 - 10 in outside 12 in walkway width or uses assistive device    Gait and Pivot Turn  Pivot turns safely in greater than 3 sec and stops with no loss of balance, or pivot turns safely within 3 sec and stops with mild imbalance, requires small steps to catch balance.    Step Over Obstacle  Is able to step over one shoe box (4.5 in total height) but must slow down and adjust steps to clear box safely. May require verbal cueing.    Gait with Narrow Base of Support  Ambulates 7-9 steps.    Gait with Eyes Closed  Walks 20 ft, slow speed, abnormal gait pattern, evidence for imbalance, deviates 10-15 in outside 12 in walkway width. Requires more than 9 sec to ambulate 20 ft.    Ambulating Backwards  Cannot walk 20 ft without assistance, severe gait deviations or imbalance, deviates greater than 15 in outside 12 in walkway width or will not attempt task.    Steps  Two feet to a stair, must use rail.    Total Score  13    FGA comment:  < 19 = high risk fall                Objective measurements completed on examination: See above findings.              PT Education - 11/27/17 0940    Education Details  Educated on evaluation results, POC, goals, post stroke fatigue (will name episode as CVA for now), recommendations for OT order when seeing PCP tomorrow.     Person(s) Educated  Patient    Methods  Explanation    Comprehension  Verbalized understanding        PT Short Term Goals - 11/27/17 0950      PT SHORT TERM GOAL #1   Title  Pt will initiate HEP in order to indicate improved functional mobility and decreased fall risk.  (Target Date: 12/27/17)    Time  4    Period  Weeks    Status  New    Target Date  12/27/17      PT SHORT TERM GOAL #2   Title  Pt will improve gait speed to 3.86 ft/sec in order to indicate more efficient gait speed and improved community negotiation.     Time  4    Period  Weeks    Status  New      PT SHORT TERM GOAL #3   Title  Pt will improve FGA to 17/30 in order to indicate decreased fall risk.     Time  4    Period  Weeks    Status  New      PT SHORT  TERM GOAL #4   Title  Pt will perform 5TSS in </=11 secs without UE support and more midline posture in order to indicate improved functional strength.     Time  4    Period  Weeks    Status  New      PT SHORT TERM GOAL #5   Title  Will assess 6MWT and improve distance by 150' from baseline in order to indicate improved functional endurance.      Time  4    Period  Weeks    Status  New      Additional Short Term Goals   Additional Short Term Goals  Yes      PT SHORT TERM GOAL #6   Title  Pt will negotiate up/down 12 stairs with single rail in reciprocal pattern and mod I level in order to indicate safe home negotiation.     Time  4    Period  Weeks    Status  New      PT SHORT TERM GOAL #7   Title  Pt will ambulate x 500' over unlevel outdoor paved and grassy surfaces (including curb step) at mod I level in order to indicate safe community negotiation.     Time  4    Period  Weeks    Status  New        PT Long Term Goals - 11/27/17 0954      PT LONG TERM GOAL #1   Title  Pt will be independent with final HEP in order to indicate decreased fall risk and improved functional mobility. (Target Date: 01/26/18)    Time  8    Period  Weeks    Status  New    Target Date  01/26/18      PT LONG TERM GOAL #2   Title  Pt will improve gait  speed to >/=4.00 ft/sec in order to indicate more efficient and safe community gait.     Time  8    Period  Weeks    Status  New      PT LONG TERM GOAL #3   Title  Pt will improve FGA to 21/30 in order to indicate decreased fall risk.      Time  8    Period  Weeks    Status  New      PT LONG TERM GOAL #4   Title  Pt will ambulate >1000' over varying outdoor surfaces at independent level in order to indicate safe return to community and leisure tasks.      Time  8    Period  Weeks    Status  New      PT LONG TERM GOAL #5   Title  Pt will improve 6MWT distance by 300' from baseline in order to indicate improved functional endurance.     Time  8    Period  Weeks    Status  New      Additional Long Term Goals   Additional Long Term Goals  Yes      PT LONG TERM GOAL #6   Title  Pt will negotiate up/down 12 stairs without rail in reciprocal pattern while carrying small item in order to indicate improved functional independence.     Time  8    Period  Weeks    Status  New             Plan - 11/27/17 0942    Clinical  Impression Statement  Pt presents s/p stroke like symptoms on 7/31 with headache (which continues), R sided weakness, decreased balance, decreased sensation and decreased endurance.  Note that CT and MRI done in hospital were both negative for CVA, therefore ED/hospital notes question migraine as cause for symptoms.  She sees her PCP tomorrow and is going to try to get into Neurology sooner ([facility] did not have opening until Oct).  Upon PT evaluation, note gait speed is 3.26 ft/sec which is decreased from normal gait speed (4.37 ft/sec), 5TSS time of 12.63 secs with increased sway and weight shift to the L indicative of decreased strength, and FGA score of 13/30 indicative of high fall risk.  Pt endorses RUE weakness and sensory changes as well (she notes them more now that she is at home and also has returned to work on limited basis), therefore recommend she request OT  order from PCP tomorrow, but will also send note to MD regarding this manner.  Pt will benefit from skilled OP neuro PT to address deficits.      History and Personal Factors relevant to plan of care:  has been unable to return to work at full capacity, unable to ambulate long distances, hx of endometrial cancer, depression, narcolepsy    Clinical Presentation  Evolving    Clinical Presentation due to:  see above    Clinical Decision Making  Moderate    Rehab Potential  Good    Clinical Impairments Affecting Rehab Potential  uncertainty of diagnosis    PT Frequency  2x / week    PT Duration  8 weeks    PT Treatment/Interventions  ADLs/Self Care Home Management;Electrical Stimulation;Gait training;Stair training;Functional mobility training;Therapeutic activities;Therapeutic exercise;Balance training;Neuromuscular re-education;Patient/family education;Orthotic Fit/Training;Passive range of motion;Vestibular    PT Next Visit Plan  6MWT, begin HEP for RLE strength and balance, stair training, backwards gait, balance on compliant surfaces, EC    Recommended Other Services  OT referral     Consulted and Agree with Plan of Care  Patient       Patient will benefit from skilled therapeutic intervention in order to improve the following deficits and impairments:  Abnormal gait, Decreased activity tolerance, Decreased balance, Decreased endurance, Decreased mobility, Decreased strength, Impaired perceived functional ability, Impaired flexibility, Impaired sensation, Impaired UE functional use, Postural dysfunction  Visit Diagnosis: Hemiplegia affecting right dominant side, unspecified etiology, unspecified hemiplegia type (HCC)  Unsteadiness on feet  Other abnormalities of gait and mobility  Abnormal posture     Problem List Patient Active Problem List   Diagnosis Date Noted  . 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 Cardiac microvascular disease 11/21/2017  . Depression 11/21/2017  . 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 Adrenal insufficiency ([place]) 10/23/2016  . Atelectasis   . Acute respiratory failure ([place])   . Encounter for central line placement   . Near syncope 10/19/2016  . Hypotension 07/05/2016  . Morbid obesity due to excess calories ([place]) 07/01/2016  . Solitary pulmonary nodule on lung CT   . Narcolepsy   . Vitamin B 12 deficiency   . Mild diastolic dysfunction   . Obstructive sleep apnea     Cameron Sprang, PT, MPT [facility] [address] [place], [place], [address] Phone: [phone]   Fax:  [phone] 11/27/17, 10:01 AM  Name: Jennifer Mayo MRN: [id number] Date of Birth: 01-24-1976

## 2017-11-28 DIAGNOSIS — R4789 Other speech disturbances: Secondary | ICD-10-CM | POA: Diagnosis not present

## 2017-12-01 ENCOUNTER — Ambulatory Visit: Payer: 59 | Admitting: Rehabilitation

## 2017-12-01 ENCOUNTER — Encounter: Payer: Self-pay | Admitting: Rehabilitation

## 2017-12-01 DIAGNOSIS — G8191 Hemiplegia, unspecified affecting right dominant side: Secondary | ICD-10-CM | POA: Diagnosis not present

## 2017-12-01 DIAGNOSIS — R2681 Unsteadiness on feet: Secondary | ICD-10-CM | POA: Diagnosis not present

## 2017-12-01 DIAGNOSIS — R293 Abnormal posture: Secondary | ICD-10-CM | POA: Diagnosis not present

## 2017-12-01 DIAGNOSIS — R2689 Other abnormalities of gait and mobility: Secondary | ICD-10-CM

## 2017-12-01 NOTE — Patient Instructions (Signed)
Access Code: [email or web address]  URL: [email or web address]  Date: 12/01/2017  Prepared by: Cameron Sprang   Exercises  Sit to Stand without Arm Support - 10 reps - 1 sets - 2x daily - 7x weekly  Single Leg Stance with Support - 3 reps - 1 sets - 15 hold - 2x daily - 7x weekly  Standing Hip Abduction - 10 reps - 1 sets - 2x daily - 7x weekly  Standing Hip Extension - 10 reps - 1 sets - 2x daily - 7x weekly  Standing Single Leg Heel Raise - 15 reps - 1 sets - 2x daily - 7x weekly  Mini Squat - 10 reps - 3 sets - 1x daily - 7x weekly  Romberg Stance Eyes Closed on Foam Pad - 3 reps - 1 sets - 20 hold - 2x daily - 7x weekly

## 2017-12-01 NOTE — Therapy (Signed)
[facility] [address] [place], [place], [address] Phone: [phone]   Fax:  [phone]  Physical Therapy Treatment  Patient Details  Name: Jennifer Mayo MRN: [id number] Date of Birth: 02/13/1976 Referring Provider: Ferd Hibbs MD   Encounter Date: 12/01/2017  PT End of Session - 12/01/17 1553    Visit Number  2    Number of Visits  17    Date for PT Re-Evaluation  01/26/18    Authorization Type  [facility] [place]     PT Start Time  1318    PT Stop Time  1402    PT Time Calculation (min)  44 min    Activity Tolerance  Patient tolerated treatment well    Behavior During Therapy  [facility] for tasks assessed/performed       Past Medical History:  Diagnosis Date  . Anemia   . Cancer [facility])    endometrial  . Cardiac microvascular disease   . CHF (congestive heart failure) ([place])   . Depression   . History of endometrial cancer    had hysterectomy in August 2015  . Mild diastolic dysfunction   . Narcolepsy   . Obstructive sleep apnea   . Pleural effusion, bilateral    3-4 years   . Solitary pulmonary nodule on lung CT    follow up CT recommended per medical records  . Thyroid disease   . Vitamin B 12 deficiency     Past Surgical History:  Procedure Laterality Date  . ABDOMINAL HYSTERECTOMY    . CHOLECYSTECTOMY    . 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 UPPER GI ENDOSCOPY  07/01/2016   Procedure: UPPER GI ENDOSCOPY;  Surgeon: Arta Bruce Kinsinger, MD;  Location: [facility];  Service: General;;    There were no vitals filed for this visit.  Subjective Assessment - 12/01/17 1326    Subjective  Went to MD, they are trying to get her into Neurology sooner.  May do another MRI if can't get in  sooner.      Pertinent History  microvascular cardiac disease (arteries spasm when HR goes up), depression, endometrial cancer    Limitations  House hold activities;Walking    How long can you walk comfortably?  notices R LE weakness within 30 secs, esp as day increases    Patient Stated Goals  "increase strength in R arm and leg"     Currently in Pain?  Yes    Pain Score  4     Pain Location  Head    Pain Descriptors / Indicators  Aching;Headache    Multiple Pain Sites  Yes    Pain Score  4    Pain Location  Leg    Pain Orientation  Right    Pain Descriptors / Indicators  Tingling    Pain Type  Acute pain    Pain Radiating Towards  R arm and R leg (>RLE)    Pain Onset  More than a month ago    Pain Frequency  Constant    Aggravating Factors   increased use/activity    Pain Relieving Factors  rest          [facility] PT Assessment - 12/01/17 1329      6 Minute Walk- Baseline   6  Minute Walk- Baseline  yes    BP (mmHg)  (!) 153/99    HR (bpm)  81    02 Sat (%RA)  99 %    Modified Borg Scale for Dyspnea  0- Nothing at all    Perceived Rate of Exertion (Borg)  6-      6 Minute walk- Post Test   6 Minute Walk Post Test  yes    BP (mmHg)  (!) 172/101   152/96 following 2-3 mins   HR (bpm)  91    02 Sat (%RA)  99 %    Modified Borg Scale for Dyspnea  4- somewhat severe    Perceived Rate of Exertion (Borg)  15- Hard      6 minute walk test results    Aerobic Endurance Distance Walked  930    Endurance additional comments  without rest breaks, however note increased veering to the R, decreased gait speed and R lateral trunk lean.            Access Code: [email or web address]  URL: [email or web address]  Date: 12/01/2017  Prepared by: Cameron Sprang   Exercises  Sit to Stand without Arm Support - 10 reps - 1 sets - 2x daily - 7x weekly  Single Leg Stance with Support - 3 reps - 1 sets - 15 hold - 2x daily - 7x weekly  Standing Hip Abduction - 10 reps - 1 sets - 2x daily  - 7x weekly  Standing Hip Extension - 10 reps - 1 sets - 2x daily - 7x weekly  Standing Single Leg Heel Raise - 15 reps - 1 sets - 2x daily - 7x weekly  Mini Squat - 10 reps - 3 sets - 1x daily - 7x weekly  Romberg Stance Eyes Closed on Foam Pad - 3 reps - 1 sets - 20 hold - 2x daily - 7x weekly                  PT Education - 12/01/17 1552    Education Details  continuing to try and get OT order, results of 6MWT, HEP     Person(s) Educated  Patient    Methods  Explanation;Demonstration;Handout    Comprehension  Verbalized understanding;Returned demonstration       PT Short Term Goals - 11/27/17 0950      PT SHORT TERM GOAL #1   Title  Pt will initiate HEP in order to indicate improved functional mobility and decreased fall risk.  (Target Date: 12/27/17)    Time  4    Period  Weeks    Status  New    Target Date  12/27/17      PT SHORT TERM GOAL #2   Title  Pt will improve gait speed to 3.86 ft/sec in order to indicate more efficient gait speed and improved community negotiation.     Time  4    Period  Weeks    Status  New      PT SHORT TERM GOAL #3   Title  Pt will improve FGA to 17/30 in order to indicate decreased fall risk.     Time  4    Period  Weeks    Status  New      PT SHORT TERM GOAL #4   Title  Pt will perform 5TSS in </=11 secs without UE support and more midline posture in order to indicate improved functional strength.     Time  4  Period  Weeks    Status  New      PT SHORT TERM GOAL #5   Title  Will assess 6MWT and improve distance by 150' from baseline in order to indicate improved functional endurance.      Time  4    Period  Weeks    Status  New      Additional Short Term Goals   Additional Short Term Goals  Yes      PT SHORT TERM GOAL #6   Title  Pt will negotiate up/down 12 stairs with single rail in reciprocal pattern and mod I level in order to indicate safe home negotiation.     Time  4    Period  Weeks    Status  New       PT SHORT TERM GOAL #7   Title  Pt will ambulate x 500' over unlevel outdoor paved and grassy surfaces (including curb step) at mod I level in order to indicate safe community negotiation.     Time  4    Period  Weeks    Status  New        PT Long Term Goals - 11/27/17 0954      PT LONG TERM GOAL #1   Title  Pt will be independent with final HEP in order to indicate decreased fall risk and improved functional mobility. (Target Date: 01/26/18)    Time  8    Period  Weeks    Status  New    Target Date  01/26/18      PT LONG TERM GOAL #2   Title  Pt will improve gait speed to >/=4.00 ft/sec in order to indicate more efficient and safe community gait.     Time  8    Period  Weeks    Status  New      PT LONG TERM GOAL #3   Title  Pt will improve FGA to 21/30 in order to indicate decreased fall risk.      Time  8    Period  Weeks    Status  New      PT LONG TERM GOAL #4   Title  Pt will ambulate >1000' over varying outdoor surfaces at independent level in order to indicate safe return to community and leisure tasks.      Time  8    Period  Weeks    Status  New      PT LONG TERM GOAL #5   Title  Pt will improve 6MWT distance by 300' from baseline in order to indicate improved functional endurance.     Time  8    Period  Weeks    Status  New      Additional Long Term Goals   Additional Long Term Goals  Yes      PT LONG TERM GOAL #6   Title  Pt will negotiate up/down 12 stairs without rail in reciprocal pattern while carrying small item in order to indicate improved functional independence.     Time  8    Period  Weeks    Status  New            Plan - 12/01/17 1553    Clinical Impression Statement  Pt reports having gone to MD who is trying to get her into [facility] (neurology) sooner.  Note that her BP was elevated during session but proceeded with caution.  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  Pt with 6MWT distance of 930' which  is markedly below normal for her age group.  Initiated HEP for RLE strengthening and balance during session.  Pt tolerated well.     Rehab Potential  Good    Clinical Impairments Affecting Rehab Potential  uncertainty of diagnosis    PT Frequency  2x / week    PT Duration  8 weeks   may not need full 8 weeks   PT Treatment/Interventions  ADLs/Self Care Home Management;Electrical Stimulation;Gait training;Stair training;Functional mobility training;Therapeutic activities;Therapeutic exercise;Balance training;Neuromuscular re-education;Patient/family education;Orthotic Fit/Training;Passive range of motion;Vestibular    PT Next Visit Plan  check HEP and add as needed, check BP, stair training, backwards gait, balance on compliant surfaces, EC    Consulted and Agree with Plan of Care  Patient       Patient will benefit from skilled therapeutic intervention in order to improve the following deficits and impairments:  Abnormal gait, Decreased activity tolerance, Decreased balance, Decreased endurance, Decreased mobility, Decreased strength, Impaired perceived functional ability, Impaired flexibility, Impaired sensation, Impaired UE functional use, Postural dysfunction  Visit Diagnosis: Hemiplegia affecting right dominant side, unspecified etiology, unspecified hemiplegia type ([place])  Unsteadiness on feet  Other abnormalities of gait and mobility  Abnormal posture     Problem List Patient Active Problem List   Diagnosis Date Noted  . 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 Cardiac microvascular disease 11/21/2017  . Depression 11/21/2017  . 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 Adrenal insufficiency ([place]) 10/23/2016  . Atelectasis   . Acute respiratory failure ([place])   . Encounter for central line placement   . Near syncope 10/19/2016  . Hypotension 07/05/2016  . Morbid obesity due to excess calories ([place]) 07/01/2016  . Solitary pulmonary nodule on lung CT   . Narcolepsy   .  Vitamin B 12 deficiency   . Mild diastolic dysfunction   . Obstructive sleep apnea     Cameron Sprang, PT, MPT [facility] [address] [place], [place], [address] Phone: [phone]   Fax:  [phone] 12/01/17, 3:57 PM  Name: Jennifer Mayo MRN: [id number] Date of Birth: [birth year]

## 2017-12-02 DIAGNOSIS — F411 Generalized anxiety disorder: Secondary | ICD-10-CM | POA: Diagnosis not present

## 2017-12-02 DIAGNOSIS — F331 Major depressive disorder, recurrent, moderate: Secondary | ICD-10-CM | POA: Diagnosis not present

## 2017-12-05 ENCOUNTER — Encounter: Payer: Self-pay | Admitting: Rehabilitation

## 2017-12-05 ENCOUNTER — Other Ambulatory Visit: Payer: Self-pay | Admitting: *Deleted

## 2017-12-05 ENCOUNTER — Ambulatory Visit: Payer: 59 | Admitting: Rehabilitation

## 2017-12-05 VITALS — BP 153/102

## 2017-12-05 DIAGNOSIS — G8191 Hemiplegia, unspecified affecting right dominant side: Secondary | ICD-10-CM | POA: Diagnosis not present

## 2017-12-05 DIAGNOSIS — R293 Abnormal posture: Secondary | ICD-10-CM | POA: Diagnosis not present

## 2017-12-05 DIAGNOSIS — R2681 Unsteadiness on feet: Secondary | ICD-10-CM

## 2017-12-05 DIAGNOSIS — R2689 Other abnormalities of gait and mobility: Secondary | ICD-10-CM | POA: Diagnosis not present

## 2017-12-05 NOTE — Therapy (Signed)
[facility] [address] [place], [place], [address] Phone: [phone]   Fax:  [phone]  Physical Therapy Treatment  Patient Details  Name: Jennifer Mayo MRN: [id number] Date of Birth: June 04, 1975 Referring Provider: Ferd Hibbs MD   Encounter Date: 12/05/2017  PT End of Session - 12/05/17 1155    Visit Number  3    Number of Visits  17    Date for PT Re-Evaluation  01/26/18    Authorization Type  [facility] [place]     PT Start Time  1151    PT Stop Time  1230    PT Time Calculation (min)  39 min    Activity Tolerance  Patient tolerated treatment well    Behavior During Therapy  [facility] for tasks assessed/performed       Past Medical History:  Diagnosis Date  . Anemia   . Cancer [facility])    endometrial  . Cardiac microvascular disease   . CHF (congestive heart failure) ([place])   . Depression   . History of endometrial cancer    had hysterectomy in August 2015  . Mild diastolic dysfunction   . Narcolepsy   . Obstructive sleep apnea   . Pleural effusion, bilateral    3-4 years   . Solitary pulmonary nodule on lung CT    follow up CT recommended per medical records  . Thyroid disease   . Vitamin B 12 deficiency     Past Surgical History:  Procedure Laterality Date  . ABDOMINAL HYSTERECTOMY    . CHOLECYSTECTOMY    . 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 UPPER GI ENDOSCOPY  07/01/2016   Procedure: UPPER GI ENDOSCOPY;  Surgeon: Arta Bruce Kinsinger, MD;  Location: [facility];  Service: General;;    Vitals:   12/05/17 1310  BP: (!) 153/102    Subjective Assessment - 12/05/17 1152    Subjective  No changes since last visit,  arm and leg still having increased symptoms at end of day.      Pertinent History  microvascular cardiac disease (arteries spasm when HR goes up), depression, endometrial cancer    Limitations  House hold activities;Walking    How long can you walk comfortably?  notices R LE weakness within 30 secs, esp as day increases    Patient Stated Goals  "increase strength in R arm and leg"     Currently in Pain?  Yes    Pain Score  2     Pain Location  Head    Pain Descriptors / Indicators  Aching;Headache    Pain Type  Acute pain    Pain Onset  1 to 4 weeks ago    Pain Frequency  Constant    Aggravating Factors   increases throughout the day.     Pain Relieving Factors  rest                       [facility] Adult PT Treatment/Exercise - 12/05/17 1310      Ambulation/Gait   Stairs  Yes    Stairs Assistance  5: Supervision    Stairs Assistance Details (indicate cue type and reason)  Addressed stairs during session as this was noted to be difficult during evaluation. Pt able to negotiate up/down  with single rail in reciprocal pattern, however still note hesitancy and decreased control in RLE, therefore had her increase speed of movement, esp with descent, slightly to avoid LOB.  Pt able to do x 2 more reps at mod I level.  Also had her negotiate without rails.  Again, pt able to negotiate up very well, but descent was somewhat difficult but did improve with continued reps.     Stair Management Technique  One rail Right;Alternating pattern;Forwards   then no rails, alternating pattern   Number of Stairs  12   x 2 reps   Height of Stairs  6      Neuro Re-ed    Neuro Re-ed Details   High level balance exercises to improve R LE control and activation in closed chain exercises; in // bars standing on small rocker board biased vertically maintaining balance x 2 sets of 20 secs with feet apart EO, feet apart EO with head turns side/side and up/down x 10 reps each with intermittent support, standing on board biased horizontally to have her work on more equal  WB, added horizontal head turns x 10 reps with intermittent support but she did demo improved balance and RLE WB during tasks.  Progressed to maintaining RLE in middle of rocker board (horizontally) while advancing and retro stepping LLE from ground to balance bubble on 6" step, again with light UE support as needed.  Progressed to R LE in stance on green balance disc performing same exercise x 10 reps on each surface.  This was more difficult, but she is able to do with slightly more UE support.  Based on difficulty with stairs, had pt work on more eccentric control in RLE with step downs from 6" step with UE support lightly touching whole foot to floor x 10 reps-added to HEP.  Also had pt perform SLS on pillows in corner which was able to do with moderate challenge x 20 secs, had her add this to HEP (she is doing SLS but add pillow).  Ended with R LE in stance on airex and LLE lightly propped on yoga block tossing ball to wall and catching x 10 reps.  Pt needs min A to steady with this exercise.               PT Education - 12/05/17 1154    Person(s) Educated  Patient    Methods  Explanation    Comprehension  Verbalized understanding       PT Short Term Goals - 11/27/17 0950      PT SHORT TERM GOAL #1   Title  Pt will initiate HEP in order to indicate improved functional mobility and decreased fall risk.  (Target Date: 12/27/17)    Time  4    Period  Weeks    Status  New    Target Date  12/27/17      PT SHORT TERM GOAL #2   Title  Pt will improve gait speed to 3.86 ft/sec in order to indicate more efficient gait speed and improved community negotiation.     Time  4    Period  Weeks    Status  New      PT SHORT TERM GOAL #3   Title  Pt will improve FGA to 17/30 in order to indicate decreased fall risk.     Time  4    Period  Weeks    Status  New      PT SHORT TERM GOAL #  4   Title  Pt will perform 5TSS in </=11 secs without UE support and more midline posture in order to indicate  improved functional strength.     Time  4    Period  Weeks    Status  New      PT SHORT TERM GOAL #5   Title  Will assess 6MWT and improve distance by 150' from baseline in order to indicate improved functional endurance.      Time  4    Period  Weeks    Status  New      Additional Short Term Goals   Additional Short Term Goals  Yes      PT SHORT TERM GOAL #6   Title  Pt will negotiate up/down 12 stairs with single rail in reciprocal pattern and mod I level in order to indicate safe home negotiation.     Time  4    Period  Weeks    Status  New      PT SHORT TERM GOAL #7   Title  Pt will ambulate x 500' over unlevel outdoor paved and grassy surfaces (including curb step) at mod I level in order to indicate safe community negotiation.     Time  4    Period  Weeks    Status  New        PT Long Term Goals - 11/27/17 0954      PT LONG TERM GOAL #1   Title  Pt will be independent with final HEP in order to indicate decreased fall risk and improved functional mobility. (Target Date: 01/26/18)    Time  8    Period  Weeks    Status  New    Target Date  01/26/18      PT LONG TERM GOAL #2   Title  Pt will improve gait speed to >/=4.00 ft/sec in order to indicate more efficient and safe community gait.     Time  8    Period  Weeks    Status  New      PT LONG TERM GOAL #3   Title  Pt will improve FGA to 21/30 in order to indicate decreased fall risk.      Time  8    Period  Weeks    Status  New      PT LONG TERM GOAL #4   Title  Pt will ambulate >1000' over varying outdoor surfaces at independent level in order to indicate safe return to community and leisure tasks.      Time  8    Period  Weeks    Status  New      PT LONG TERM GOAL #5   Title  Pt will improve 6MWT distance by 300' from baseline in order to indicate improved functional endurance.     Time  8    Period  Weeks    Status  New      Additional Long Term Goals   Additional Long Term Goals  Yes      PT  LONG TERM GOAL #6   Title  Pt will negotiate up/down 12 stairs without rail in reciprocal pattern while carrying small item in order to indicate improved functional independence.     Time  8    Period  Weeks    Status  New            Plan - 12/05/17 1502    Clinical Impression Statement  Skilled session focused on improving R LE control/activation in closed chain positions.  also addressed safe and more independent negotiation of stairs.  She does demonstrate improvement during session and increased challenge with SLS exercises.      Rehab Potential  Good    Clinical Impairments Affecting Rehab Potential  uncertainty of diagnosis    PT Frequency  2x / week    PT Duration  8 weeks   may not need full 8 weeks   PT Treatment/Interventions  ADLs/Self Care Home Management;Electrical Stimulation;Gait training;Stair training;Functional mobility training;Therapeutic activities;Therapeutic exercise;Balance training;Neuromuscular re-education;Patient/family education;Orthotic Fit/Training;Passive range of motion;Vestibular    PT Next Visit Plan  check HEP and add as needed, check BP in LUE, high level balance challenges for RLE, stair training, backwards gait, balance on compliant surfaces, EC    Consulted and Agree with Plan of Care  Patient       Patient will benefit from skilled therapeutic intervention in order to improve the following deficits and impairments:  Abnormal gait, Decreased activity tolerance, Decreased balance, Decreased endurance, Decreased mobility, Decreased strength, Impaired perceived functional ability, Impaired flexibility, Impaired sensation, Impaired UE functional use, Postural dysfunction  Visit Diagnosis: Hemiplegia affecting right dominant side, unspecified etiology, unspecified hemiplegia type ([facility])  Unsteadiness on feet  Other abnormalities of gait and mobility  Abnormal posture     Problem List Patient Active Problem List   Diagnosis Date Noted  .  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 Cardiac microvascular disease 11/21/2017  . Depression 11/21/2017  . 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 Adrenal insufficiency ([place]) 10/23/2016  . Atelectasis   . Acute respiratory failure ([place])   . Encounter for central line placement   . Near syncope 10/19/2016  . Hypotension 07/05/2016  . Morbid obesity due to excess calories ([place]) 07/01/2016  . Solitary pulmonary nodule on lung CT   . Narcolepsy   . Vitamin B 12 deficiency   . Mild diastolic dysfunction   . Obstructive sleep apnea     Cameron Sprang, PT, MPT [facility] [address] [place], [place], [address] Phone: [phone]   Fax:  [phone] 12/05/17, 3:04 PM  Name: Jennifer Mayo MRN: [id number] Date of Birth: [birth year]

## 2017-12-05 NOTE — Patient Outreach (Signed)
[facility]) Care Management  12/05/2017  Jennifer Mayo [birth year] [id number]    No response from patient outreach attempts will proceed with case closure.    Objective:Per KPN ([employer]ledge [employer], point of care tool) and chart review,patient hospitalized 11/19/17 -11/21/17 for stroke like episode. Patient also has a history of Narcolepsy,Obstructive sleep apnea,Hemiplegic migraine,Cardiac microvascular disease,Anemia,endometrialcancer,CHF (congestive heart failure),Solitary pulmonary nodule on lung CT, andVitamin B 12 deficiency.     Assessment: Received [facility] Transition of care referral on 11/24/17.Transition of care follow up not completed due to unable to contact patient and will proceed with case closure.       Plan:Case closure due to unable to reach.      Aulani Shipton H. Annia Friendly, BSN, [place] [facility] Telephonic CM Phone: [phone] Fax: [phone]

## 2017-12-07 ENCOUNTER — Encounter: Payer: Self-pay | Admitting: Neurology

## 2017-12-08 ENCOUNTER — Encounter: Payer: Self-pay | Admitting: Physical Therapy

## 2017-12-08 ENCOUNTER — Ambulatory Visit: Payer: 59 | Admitting: Physical Therapy

## 2017-12-08 VITALS — BP 131/88 | HR 76

## 2017-12-08 DIAGNOSIS — G8191 Hemiplegia, unspecified affecting right dominant side: Secondary | ICD-10-CM | POA: Diagnosis not present

## 2017-12-08 DIAGNOSIS — R293 Abnormal posture: Secondary | ICD-10-CM | POA: Diagnosis not present

## 2017-12-08 DIAGNOSIS — R2689 Other abnormalities of gait and mobility: Secondary | ICD-10-CM

## 2017-12-08 DIAGNOSIS — R2681 Unsteadiness on feet: Secondary | ICD-10-CM

## 2017-12-08 NOTE — Therapy (Signed)
[facility] [address] [place], [place], [address] Phone: [phone]   Fax:  [phone]  Physical Therapy Treatment  Patient Details  Name: Jennifer Mayo MRN: [id number] Date of Birth: 1975/06/17 Referring Provider: Ferd Hibbs MD   Encounter Date: 12/08/2017  PT End of Session - 12/08/17 0806    Visit Number  4    Number of Visits  17    Date for PT Re-Evaluation  01/26/18    Authorization Type  [facility]     PT Start Time  0805    PT Stop Time  0845    PT Time Calculation (min)  40 min    Equipment Utilized During Treatment  Gait belt    Activity Tolerance  Patient tolerated treatment well;Patient limited by fatigue    Behavior During Therapy  [facility] for tasks assessed/performed       Past Medical History:  Diagnosis Date  . Anemia   . Cancer [facility])    endometrial  . Cardiac microvascular disease   . CHF (congestive heart failure) ([place])   . Depression   . History of endometrial cancer    had hysterectomy in August 2015  . Mild diastolic dysfunction   . Narcolepsy   . Obstructive sleep apnea   . Pleural effusion, bilateral    3-4 years   . Solitary pulmonary nodule on lung CT    follow up CT recommended per medical records  . Thyroid disease   . Vitamin B 12 deficiency     Past Surgical History:  Procedure Laterality Date  . ABDOMINAL HYSTERECTOMY    . CHOLECYSTECTOMY    . 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 UPPER GI ENDOSCOPY  07/01/2016   Procedure: UPPER GI ENDOSCOPY;  Surgeon: Arta Bruce Kinsinger, MD;  Location: [facility];  Service: General;;    Vitals:   12/08/17 0805 12/08/17 0831  BP: (!) 136/91 131/88  Pulse: 71 76    Subjective Assessment - 12/08/17 0805     Subjective  No new complaints. Still with a minor headache that varies in intensity. No falls.     Pertinent History  microvascular cardiac disease (arteries spasm when HR goes up), depression, endometrial cancer    Limitations  House hold activities;Walking    How long can you walk comfortably?  notices R LE weakness within 30 secs, esp as day increases    Currently in Pain?  Yes    Pain Score  2     Pain Location  Head    Pain Descriptors / Indicators  Headache    Pain Type  Acute pain    Pain Onset  1 to 4 weeks ago    Pain Frequency  Constant    Aggravating Factors   increases throughout the day    Pain Relieving Factors  rest          [facility] Adult PT Treatment/Exercise - 12/08/17 7619      Ambulation/Gait   Ambulation/Gait  Yes    Ambulation/Gait Assistance  5: Supervision;4: Min guard    Assistive device  None    Gait Pattern  Step-through pattern;Lateral hip instability;Narrow base of support;Right steppage    Ambulation Surface  Level;Indoor    Gait Comments  gait along ~50 foot hallway: fwd  gait with head movements left<>fwd<>right, then up<>fwd<>down x 4 laps each. min guard assist with minor veering noted and slight decr in gait speed. no significant balance loss noted.       High Level Balance   High Level Balance Activities  Marching forwards;Marching backwards   toe walk fwd/bwd, heel walk fwd/bwd   High Level Balance Comments  on blue mat next to counter top: 3 laps each with light to no UE support on counter. min guard to min assist for balance with cues on posture and ex form/technique.           Balance Exercises - 12/08/17 0817      Balance Exercises: Standing   Standing Eyes Closed  Narrow base of support (BOS);Wide (BOA);Head turns;Foam/compliant surface;Other reps (comment);30 secs;Limitations    Rockerboard  Anterior/posterior;Other time (comment);Other reps (comment)    Balance Beam  on blue foam beam: side stepping left<>right x 3 laps each way  with hands hovering over bar, min guard assist with cues for step length/height; tandem stepping fwd/bwd x 3 laps each with light touch to bars for balance, min guard assist,  cues on posture and step placement, progressed to tandem stepping fwd/bwd with heel taps to floor before placing foot on beam, light UE support with cues on form and posture.        Balance Exercises: Standing   Standing Eyes Closed Limitations  airex in corner with chair in front for safety: narrow base of support with no UE movements with EC, progressing to wide base of support with EC for head movements left<>right, up <>down and diagonals both ways. min assist for balance with occasional touch to chair/walls for balance assistance. cues on posture and weight shifting to assist with balance recovery.     Rebounder Limitations  balance board in ant/post direction: reciprocal stepping floor<>balance board<>6 inch step with march performed by non stance leg on step, 10 reps each foot leading with light UE support, min guard to min assist. cues on ex technique and posture; with one foot in stance on balance board- other leg performed fwd heel taps to floor <> bwd toe taps to floor, 2 sets of 5 reps with each foot in stance with light UE support on bars. cues for step length/height to clear board.           PT Short Term Goals - 11/27/17 0950      PT SHORT TERM GOAL #1   Title  Pt will initiate HEP in order to indicate improved functional mobility and decreased fall risk.  (Target Date: 12/27/17)    Time  4    Period  Weeks    Status  New    Target Date  12/27/17      PT SHORT TERM GOAL #2   Title  Pt will improve gait speed to 3.86 ft/sec in order to indicate more efficient gait speed and improved community negotiation.     Time  4    Period  Weeks    Status  New      PT SHORT TERM GOAL #3   Title  Pt will improve FGA to 17/30 in order to indicate decreased fall risk.     Time  4    Period  Weeks    Status  New       PT SHORT TERM GOAL #4   Title  Pt will perform 5TSS in </=11 secs without UE support and more midline posture in order to  indicate improved functional strength.     Time  4    Period  Weeks    Status  New      PT SHORT TERM GOAL #5   Title  Will assess 6MWT and improve distance by 150' from baseline in order to indicate improved functional endurance.      Time  4    Period  Weeks    Status  New      Additional Short Term Goals   Additional Short Term Goals  Yes      PT SHORT TERM GOAL #6   Title  Pt will negotiate up/down 12 stairs with single rail in reciprocal pattern and mod I level in order to indicate safe home negotiation.     Time  4    Period  Weeks    Status  New      PT SHORT TERM GOAL #7   Title  Pt will ambulate x 500' over unlevel outdoor paved and grassy surfaces (including curb step) at mod I level in order to indicate safe community negotiation.     Time  4    Period  Weeks    Status  New        PT Long Term Goals - 11/27/17 0954      PT LONG TERM GOAL #1   Title  Pt will be independent with final HEP in order to indicate decreased fall risk and improved functional mobility. (Target Date: 01/26/18)    Time  8    Period  Weeks    Status  New    Target Date  01/26/18      PT LONG TERM GOAL #2   Title  Pt will improve gait speed to >/=4.00 ft/sec in order to indicate more efficient and safe community gait.     Time  8    Period  Weeks    Status  New      PT LONG TERM GOAL #3   Title  Pt will improve FGA to 21/30 in order to indicate decreased fall risk.      Time  8    Period  Weeks    Status  New      PT LONG TERM GOAL #4   Title  Pt will ambulate >1000' over varying outdoor surfaces at independent level in order to indicate safe return to community and leisure tasks.      Time  8    Period  Weeks    Status  New      PT LONG TERM GOAL #5   Title  Pt will improve 6MWT distance by 300' from baseline in order to indicate improved functional  endurance.     Time  8    Period  Weeks    Status  New      Additional Long Term Goals   Additional Long Term Goals  Yes      PT LONG TERM GOAL #6   Title  Pt will negotiate up/down 12 stairs without rail in reciprocal pattern while carrying small item in order to indicate improved functional independence.     Time  8    Period  Weeks    Status  New          Plan - 12/08/17 0806    Clinical Impression Statement  Today's skilled session continued to focus on right LE strengthening and high level balance activities with no significant issues reported. Pt did fatigue with  activity, needing short seated rest breaks for recovery. Pt is progressing toward goals and should benefit from continued PT to progress toward unmet goals.     Rehab Potential  Good    Clinical Impairments Affecting Rehab Potential  uncertainty of diagnosis    PT Frequency  2x / week    PT Duration  8 weeks   may not need full 8 weeks   PT Treatment/Interventions  ADLs/Self Care Home Management;Electrical Stimulation;Gait training;Stair training;Functional mobility training;Therapeutic activities;Therapeutic exercise;Balance training;Neuromuscular re-education;Patient/family education;Orthotic Fit/Training;Passive range of motion;Vestibular    PT Next Visit Plan  check BP; continue to address right LE strengthing and balance with emphasis on single leg stance, compliant surfaces and/or vision removed.     Consulted and Agree with Plan of Care  Patient       Patient will benefit from skilled therapeutic intervention in order to improve the following deficits and impairments:  Abnormal gait, Decreased activity tolerance, Decreased balance, Decreased endurance, Decreased mobility, Decreased strength, Impaired perceived functional ability, Impaired flexibility, Impaired sensation, Impaired UE functional use, Postural dysfunction  Visit Diagnosis: Hemiplegia affecting right dominant side, unspecified etiology,  unspecified hemiplegia type ([place])  Unsteadiness on feet  Other abnormalities of gait and mobility  Abnormal posture     Problem List Patient Active Problem List   Diagnosis Date Noted  . 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 Cardiac microvascular disease 11/21/2017  . Depression 11/21/2017  . Stroke-like episode ([place]) s/p IV tPA 11/19/2017  . Adrenal insufficiency ([place]) 10/23/2016  . Atelectasis   . Acute respiratory failure ([place])   . Encounter for central line placement   . Near syncope 10/19/2016  . Hypotension 07/05/2016  . Morbid obesity due to excess calories ([place]) 07/01/2016  . Solitary pulmonary nodule on lung CT   . Narcolepsy   . Vitamin B 12 deficiency   . Mild diastolic dysfunction   . Obstructive sleep apnea     [place], [place], [facility] [address], [place], [place] [address] [phone] 12/08/17, 2:36 PM   Name: Clydette Privitera MRN: [id number] Date of Birth: [birth year]

## 2017-12-10 ENCOUNTER — Ambulatory Visit: Payer: 59 | Admitting: Physical Therapy

## 2017-12-10 ENCOUNTER — Encounter: Payer: Self-pay | Admitting: Neurology

## 2017-12-10 ENCOUNTER — Ambulatory Visit: Payer: 59 | Admitting: Neurology

## 2017-12-10 VITALS — BP 146/95 | HR 82 | Ht 67.0 in | Wt 155.0 lb

## 2017-12-10 DIAGNOSIS — G8191 Hemiplegia, unspecified affecting right dominant side: Secondary | ICD-10-CM | POA: Diagnosis not present

## 2017-12-10 DIAGNOSIS — G4733 Obstructive sleep apnea (adult) (pediatric): Secondary | ICD-10-CM | POA: Diagnosis not present

## 2017-12-10 DIAGNOSIS — Z9884 Bariatric surgery status: Secondary | ICD-10-CM

## 2017-12-10 DIAGNOSIS — E778 Other disorders of glycoprotein metabolism: Secondary | ICD-10-CM

## 2017-12-10 DIAGNOSIS — R29818 Other symptoms and signs involving the nervous system: Secondary | ICD-10-CM | POA: Diagnosis not present

## 2017-12-10 DIAGNOSIS — Z9989 Dependence on other enabling machines and devices: Secondary | ICD-10-CM

## 2017-12-10 NOTE — Progress Notes (Signed)
SLEEP MEDICINE CLINIC   Provider:  Larey Seat, M D  Referring Provider: Raelyn Number, MD Primary Care Physician:  Ferd Hibbs, NP  Chief Complaint  Patient presents with  . Follow-up    pt alone, rm 11. pt states that she was recently was in hospital worked up stroke, and ? 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 pt states that she had never had a migraine in life.CPAP still in use. DME [employer]. since the 2nd day in hospital she has constant left side headache and weakness noted right upper and lower which also comes and goes. pt states that balance is off. in therapy now. 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  As I described below the patient also has a history of congestive heart failure stage II , microvascular coronary artery disease, she is a cancer survivor. she used to be morbidly obese and had an elevated body mass index at the time of her last sleep study. She underwent gastric bypass surgery- 01 July 2016, was 330 pounds and  lost 175 pounds. She tolerated the procedure- and changed a her diet and amount of food, as well as intake times. She takes exercises and vitamins dumping syndrome has not happened after she adhered to diet.    She was admitted to hospital on 19 November 2017 with stroke symptoms, was unable to move her right lower extremity had also weakness of the right upper extremity, clumsiness and her speech was affected as well.  She had right-sided facial droop as well.  She presented with her husband to the emergency room, NIH stroke scale was 3 4 and mild right upper extremity more dominant right lower extremity weakness and facial droop, developed speech problems-.  She also had a minor headache but did not have a history of migraines nor was a headache disabling.   It appears that it was entertained that she may have complex migraine versus ischemic stroke.  Even with a low NIH stroke scale she was given IV TPA based on the hemiparesis affecting her dominant side.  Jennifer Mayo reports that while she was in the emergency room the stroke symptoms seem to progress and they only regressed after she was given TPA.  Her CT of the brain returned normal.  She has not been 100% back to baseline however and over the last 14 to 21 days still notices some clumsiness affecting the right side some numbness, some gait and balance instability.  I have reviewed her [place]  hospital records, images and laboratory studies, Echo was only performed transthoracic.  Her laboratory values showed normal metabolic panel except for low albumin 2.7, there was an elevated ALT at 59 overall protein was also low at 4.9 but glomerular filtration rate and electrolytes were normal her red blood cell count was interestingly slightly elevated at 5.4 white blood cell count was 5.9 there was no evidence of anemia and her platelet count was 182.  Blood pressure was noted is elevated but not critically so at 135/97, pulse rate 81 and regular respiratory rate at rest 17 BMI was 24 with a total weight of 150 pounds at a height of 5 foot 6 inches.  Normal pulses, CT angiogram showed no occlusion of larger vessels, and MRI of the brain  was obtained without contrast on the second day of admission and was also interpreted as normal.The patient I has no history of migraines.    I was privy to her CPAP compliance today, 21 Aug. 2019.  The patient is using an auto titration machine at 87% compliance with an average of 6 hours and 9 minutes daily use, the machine is set for a window of pressure beginning at 5 and ending at 12 cmH2O pressure with 3 cm expiratory relief the 95th percentile pressure used is 9.9 cmH2O her residual AHI is 2.7 there are no central apneas arising no Cheyne-Stokes respiration noted.  There  are some nights with air leaks which also can be related to her changing facial anatomy.  Her mask may just no longer fit her.  She endorsed a residual Epworth sleepiness score of 12 points and a high degree of fatigue at 51 points out of 63 possible points.  I am a little surprised to see sleepiness and fatigue having risen after she had this significant weight loss.  I also wonder if she truly still needs CPAP , she shouldn't need a  different mask - uses a wisp and can't sleep without CPAP. No headaches since using CPAP.     CONSULT : She was originally seen here as a referral from NP [facility] for a sleep consultation, Jennifer Mayo was diagnosed with obstructive sleep apnea about 12 years ago. She has been compliant with CPAP for over a decade.  She had been seeing a cardiologist in [place] and has had several cardiac MRIs and a cardiac cath in the past 2 years. She was diagnosed as congestive heart failure stage II microvascular coronary artery disease and stable angina. This has limited her exercise tolerance significantly and she has gained weight over the last 2 years. She is short of breath usually walking distances less than 100 yards. She has also a history of endometrial cancer followed by a hysterectomy she still has the left ovary remaining. She has a history of hypothyroidism but over the last 8 months had not needed to take Synthroid.  Her sleep evaluation 12 years ago was independent of any cardiac history since it wasn't known them. She has used her CPAP nightly but she also has taking Nuvigil for remaining daytime fatigue. She got her last supplies for CPAP from [place] but the head gear is no longer working for her. And because of this ill fitting interface she has been unable to use her CPAP as she usually would. She has an average AHI of 1.8 with a CPAP setting of 11 cm water. She has used the machine about 60% compliant which is unusual for her. I would like to prescribe her new supplies so  that she can resume compliant use of CPAP. We were able to get a download from her machine here in the office today and discuss the numbers.  Sleep habits are as follows: The marital bedroom is cool, quiet and dark. The patient tries to get about 8 hours of sleep and usually goes to bed by 10 PM. She falls asleep promptly. She sleeps through the night, rarely has any nocturia. She may wake up once or twice but not because of the urge to urinate. There is also no pain or air hungriness that wakes her. She shares a bedroom with her husband who has not reported any periodic limb movements kicking thrashing acting out dreams etc. She sleeps with the head elevated on 2 pillows, prefer sleeping on  her left side. There is breakthrough snoring if she resumes a supine position. She has to arise at about 6 AM but often sleeps through the alarm. She wakes up with headaches frequently these are throbbing dull headaches. There is a concern that these are hypoxemia related headaches or may be hypercapnia. She has never experienced nocturnal clusters or been woken by a headache attack. When the patient was hospitalized it was observed that her oxygen levels at night were low in spite of using CPAP during that stay. I reviewed the patient's CPAP download which is not an accurate representation of her usual use. I would like for Jennifer Mayo to return for a split night polysomnography. Her cardiac history was not even known by the time her last CPAP machine was issued, about 7 years ago. There has been no change in settings she is currently at 11 cm water which was the titration results of her last sleep study again 7 years ago. In addition her body mass index has increased. It may also be worthwhile to find a better fit fitting mask since many new models have been issued in the meantime. She is using the nasal whisp interface. In a hospitalization, a FFM was used, and she didn't like this.    07-25-15 I see Jennifer Mayo here today  following her sleep study from 04/11/2015. Unfortunately we couldn't meet earlier as her grandfather passed away at the time of her first possible follow-up appointment. As described above Jennifer Mayo has a list of comorbidities that make her more prone to have obstructive sleep apnea. Her sleep study showed an apnea hypopnea index of 19.0 without REM sleep. Interestingly nonsupine AHI was 44.6 which remains poorly explained. The patient was titrated beginning at 5 cm water pressure on CPAP and a pressure of 10 cm was finally explored,  under which the AHI was reduced to 0.0 . She had rebounded into REM sleep. Her sleep efficiency was now 97% and 12.8% was REM sleep.  Several pressure settings between 9, 10 and even 7 appear to be effective her oxygen nadir rose to 90% occasionally there was still breakthrough snoring noted but no arousals were caused.   I ordered an auto titrate her with the with nasal mask. The patient confirms that the mask is comfortable and we have also her first download available today she has used the machine 100% of all days and 97% of those days was over 4 hours of use, average user time is 8 hours and 18 minutes the minimum pressure is 5 cm water and maximum pressure at 10 cm water with 3 cm EPR her residual AHI is 1.5. The 95th percentile pressure is 10 cm water, the very upper limit of her window.    Sleep medical history and family sleep history: diagnosed with OSA 2004, in [place]. CHF diagnosed in [place].  Social history: married, non smoker, non drinker, one cup of coffee twice a week.   Review of Systems: Out of a complete 14 system review, the patient complains of only the following symptoms, and all other reviewed systems are negative.  The patient endorsed the Epworth score at 15 points this has to be looked at in context with her CPAP use currently. She is using Nuvigil in daytime and will need refills. She is using no other stimulant medications. Review of systems  otherwise sleeping snoring, some memory loss, headaches, dizziness, syncope, depression, decreased level of energy, aching muscles, flushing, blurred vision, chest pain and swelling in  the legs. Weight gain. Nasal congestion. Had woken with cluster headaches, new.  FSS 59 and Epworth score is 12 , waking with headaches and sometimes with a  dry mouth .   Social History   Socioeconomic History  . Marital status: Married    Spouse name: Not on file  . Number of children: Not on file  . Years of education: Not on file  . Highest education level: Not on file  Occupational History  . Not on file  Social Needs  . Financial resource strain: Not on file  . Food insecurity:    Worry: Not on file    Inability: Not on file  . Transportation needs:    Medical: Not on file    Non-medical: Not on file  Tobacco Use  . Smoking status: Never Smoker  . Smokeless tobacco: Never Used  Substance and Sexual Activity  . Alcohol use: Yes    Alcohol/week: 0.0 standard drinks    Comment: very seldom  . Drug use: No  . Sexual activity: Not on file  Lifestyle  . Physical activity:    Days per week: Not on file    Minutes per session: Not on file  . Stress: Not on file  Relationships  . Social connections:    Talks on phone: Not on file    Gets together: Not on file    Attends religious service: Not on file    Active member of club or organization: Not on file    Attends meetings of clubs or organizations: Not on file    Relationship status: Not on file  . Intimate partner violence:    Fear of current or ex partner: Not on file    Emotionally abused: Not on file    Physically abused: Not on file    Forced sexual activity: Not on file  Other Topics Concern  . Not on file  Social History Narrative   One cup of caffeine daily.    Family History  Problem Relation Age of Onset  . Cancer Mother   . Brain cancer Mother   . Seizures Mother   . Stroke Mother   . Diabetes Father     Past  Medical History:  Diagnosis Date  . Anemia   . Cancer [facility])    endometrial  . Cardiac microvascular disease   . CHF (congestive heart failure) ([place])   . Depression   . History of endometrial cancer    had hysterectomy in August 2015  . Mild diastolic dysfunction   . Narcolepsy   . Obstructive sleep apnea   . Pleural effusion, bilateral    3-4 years   . Solitary pulmonary nodule on lung CT    follow up CT recommended per medical records  . Thyroid disease   . Vitamin B 12 deficiency     Past Surgical History:  Procedure Laterality Date  . ABDOMINAL HYSTERECTOMY    . CHOLECYSTECTOMY    . 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 UPPER GI ENDOSCOPY  07/01/2016   Procedure: UPPER GI ENDOSCOPY;  Surgeon: Arta Bruce Kinsinger, MD;  Location: [facility];  Service: General;;    Current Outpatient Medications  Medication Sig Dispense Refill  . aspirin EC 81 MG tablet Take 1 tablet (81 mg total) by mouth  daily. 150 tablet 2  . buPROPion (WELLBUTRIN XL) 300 MG 24 hr tablet Take 300 mg by mouth daily.    . Calcium Carb-Cholecalciferol (CALCIUM 1000 + D PO) Take 1 tablet by mouth 3 (three) times daily.    Marland Kitchen FLUoxetine (PROZAC) 40 MG capsule Take 40 mg by mouth daily.    Marland Kitchen lisdexamfetamine (VYVANSE) 70 MG capsule Take 70 mg by mouth daily.    . modafinil (PROVIGIL) 200 MG tablet Take 200 mg by mouth daily.  3  . Multiple Vitamins-Minerals (BARIATRIC MULTIVITAMINS/IRON PO) Take 1 tablet by mouth 2 (two) times daily.     No current facility-administered medications for this visit.     Allergies as of 12/10/2017 - Review Complete 12/10/2017  Allergen Reaction Noted  . Ranexa [ranolazine] Shortness Of Breath 03/15/2015  . Nsaids  07/05/2016  . Verapamil Other (See Comments)  02/01/2015    Vitals: BP (!) 146/95   Pulse 82   Ht 5\' 7"  (1.702 m)   Wt 155 lb (70.3 kg)   BMI 24.28 kg/m  Last Weight:  Wt Readings from Last 1 Encounters:  12/10/17 155 lb (70.3 kg)   KGU:RKYH mass index is 24.28 kg/m.     Last Height:   Ht Readings from Last 1 Encounters:  12/10/17 5\' 7"  (1.702 m)    Physical exam:  General: The patient is awake, alert and appears not in acute distress. The patient is well groomed. Head: Normocephalic, atraumatic. Neck is supple. Mallampati 3,  neck circumference:17. Nasal airflow unrestricted, TMJ not evident . Retrognathia is not seen.  Cardiovascular:  Regular rate and rhythm, without distended neck veins. Respiratory: Lungs are clear to auscultation. Skin:  Without evidence of edema, or rash Trunk: BMI is elevated.    Cranial nerves: sense of taste and smell is intact.  Pupils are equal and briskly reactive to light. Funduscopic exam without evidence of pallor or edema.  Extraocular movements  in vertical and horizontal planes intact and without nystagmus. Visual fields by finger perimetry are intact. Hearing to finger rub intact.   Facial sensation , numbness to the right cheek .   Facial motor strength is asymmetric, with the nasolabial fold being absent on the right- forehead is symmetric- and  tongue and uvula move midline. Shoulder shrug was symmetrical.   During motor testing I noticed a very slight pronator drift, and a decrease in grip strength right versus left.  But there was actually more dysmetria on the left than right on finger-to-nose testing.  Gait examination shows a normal step, the gait is not wide-based, neither is a stance.  She can turn with 3 steps and presents with normal arm swing, no limp, no drift.    CLINICAL DATA:  Right upper extremity weakness. History of endometrial cancer.  EXAM: MRI HEAD WITHOUT CONTRAST  TECHNIQUE: Multiplanar, multiecho pulse sequences of the brain and  surrounding structures were obtained without intravenous contrast.  COMPARISON:  CTA head neck 11/19/2017  FINDINGS: BRAIN: There is no acute infarct, acute hemorrhage or mass effect. The midline structures are normal. There are no old infarcts. The white matter signal is normal for the patient's age. The CSF spaces are normal for age, with no hydrocephalus. Susceptibility-sensitive sequences show no chronic microhemorrhage or superficial siderosis.  VASCULAR: Major intracranial arterial and venous sinus flow voids are preserved.  SKULL AND UPPER CERVICAL SPINE: The visualized skull base, calvarium, upper cervical spine and extracranial soft tissues are normal.  SINUSES/ORBITS: No fluid levels or advanced mucosal  thickening. No mastoid or middle ear effusion. The orbits are normal.  IMPRESSION: Normal brain MRI.   Electronically Signed   By: Ulyses Jarred M.D.   On: 11/20/2017 22:05 The patient was advised of further testing being needed , differential dx the disorder, the treatment options and risks for general a health and wellness arising from not treating the condition.  I spent more than 45  minutes of face to face time with the patient. Greater than 50% of time was spent in counseling and coordination of care. We have discussed the diagnosis and differential and I answered the patient's questions.     Assessment:  After physical and neurologic examination, review of laboratory studies,  Personal review of imaging studies, reports of other /same  Imaging studies ,  Results of polysomnography/ neurophysiology testing and pre-existing records as far as provided in visit., my assessment is   1) her obstructive sleep apnea re-diagnosed in 2017 - at high BMI  ,and  known for about 12 years, AHI was 19/h  2) morbid obesity  Now status post bariataric surgery- gastric bypass , lost more than 50% of pre surgical weight, changes in anatomy, but also hair loss and  hypoproteinemia noted.  She has been highly compliant with CPAP auto set and reached a 95% CPAP pressure of 9.9 cm water - covered by current settings. Nasal WISP mask seems to fit ok, her previous hypoxemia and morning headaches were  closely related.and CPAP resolved these non- migraineous headaches . The excessive daytime sleepiness at Epworth 12 confirmed. 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  3) STROKE presentation without stroke confirmation by imaging studies.  MRI without contrast - will continue with PT, balance and OT evaluation. Right leg feels weaker- fatigued.  Fatigue in right hand, handwriting is changed, clumsy.  I will order a repeat study with MRI brain with contrast / cancer survivor- dx with endometrial cancer 2015. No local Oncologist is following her. No local Gynecologist. Please establish.  Add boost / carnation/ protein shake one in AM and one in PM.   Rv after 3 month .      Asencion Partridge Jaley Yan MD  12/10/2017   CC: Raelyn Number, Md [address] [place], [place] [address]

## 2017-12-11 ENCOUNTER — Telehealth: Payer: Self-pay | Admitting: Neurology

## 2017-12-11 NOTE — Telephone Encounter (Signed)
[facility] order sent to GI. They will reach out to the pt to schedule.

## 2017-12-15 ENCOUNTER — Ambulatory Visit: Payer: 59 | Admitting: Physical Therapy

## 2017-12-17 ENCOUNTER — Ambulatory Visit: Payer: 59 | Admitting: Physical Therapy

## 2017-12-17 ENCOUNTER — Telehealth: Payer: Self-pay | Admitting: Physical Therapy

## 2017-12-17 NOTE — Telephone Encounter (Signed)
[facility] office staff member called pt's spouse regarding missed PT appointments and that we can not get through on her phone. He was going to call her and have her call our office.   Willow Ora, PTA, [place] [address], [place], [place] [address] [phone] 12/17/17, 8:37 AM

## 2017-12-25 ENCOUNTER — Ambulatory Visit: Payer: 59 | Attending: Internal Medicine | Admitting: Physical Therapy

## 2017-12-26 ENCOUNTER — Ambulatory Visit
Admission: RE | Admit: 2017-12-26 | Discharge: 2017-12-26 | Disposition: A | Discharge status: Home / Self Care | Payer: 59 | Source: Ambulatory Visit | Attending: Neurology | Admitting: Neurology

## 2017-12-26 ENCOUNTER — Telehealth: Payer: Self-pay | Admitting: Rehabilitation

## 2017-12-26 DIAGNOSIS — R29818 Other symptoms and signs involving the nervous system: Secondary | ICD-10-CM | POA: Diagnosis not present

## 2017-12-26 MED ORDER — GADOBENATE DIMEGLUMINE 529 MG/ML IV SOLN
15.0000 mL | Freq: Once | INTRAVENOUS | Status: AC | PRN
  Administered 2017-12-26: 15 mL via INTRAVENOUS

## 2017-12-26 NOTE — Telephone Encounter (Signed)
PT called pt on 12/26/17 regarding 3 no shows and current scheduled appointments.  PT spoke with pts husband and he is supposed to relay message and have her call us back regarding future appts or the need to cancel remaining appts.    Cameron Sprang, PT, MPT [facility] [address] [place], [place], [address] Phone: [phone]   Fax:  [phone] 12/26/17, 9:48 AM

## 2017-12-29 ENCOUNTER — Telehealth: Payer: Self-pay | Admitting: Neurology

## 2017-12-29 NOTE — Telephone Encounter (Signed)
Called the patient to make her aware of the normal MRI result there was no answer. LVM informing her that the MRI was normal and there was nothing of concern. Instructed the pt to call if she had questions.

## 2017-12-29 NOTE — Telephone Encounter (Signed)
-----   Message from Larey Seat, MD sent at 12/26/2017  2:59 PM EDT ----- Normal MRI- ne demyelination, CVA or bleed, and no tumor.   Please call NP Lorelle Gibbs with results. 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

## 2017-12-30 ENCOUNTER — Encounter: Payer: Self-pay | Admitting: Neurology

## 2017-12-31 ENCOUNTER — Ambulatory Visit: Payer: 59 | Admitting: Physical Therapy

## 2018-01-02 ENCOUNTER — Ambulatory Visit: Payer: 59 | Admitting: Physical Therapy

## 2018-01-02 ENCOUNTER — Encounter: Payer: Self-pay | Admitting: Neurology

## 2018-01-05 ENCOUNTER — Ambulatory Visit: Payer: 59 | Admitting: Physical Therapy

## 2018-01-05 MED FILL — VYVANSE 70 MG CAPSULE: 70 | 30 days supply | Qty: 30 | Fill #0

## 2018-01-09 ENCOUNTER — Ambulatory Visit: Payer: 59 | Admitting: Rehabilitation

## 2018-01-14 ENCOUNTER — Ambulatory Visit: Payer: 59 | Admitting: Physical Therapy

## 2018-01-16 ENCOUNTER — Ambulatory Visit: Payer: 59 | Admitting: Rehabilitation

## 2018-01-19 DIAGNOSIS — F411 Generalized anxiety disorder: Secondary | ICD-10-CM | POA: Diagnosis not present

## 2018-01-19 DIAGNOSIS — F331 Major depressive disorder, recurrent, moderate: Secondary | ICD-10-CM | POA: Diagnosis not present

## 2018-01-20 MED FILL — buPROPion HCL ER (XL) 300 M: 300 | 90 days supply | Qty: 90 | Fill #0

## 2018-01-20 MED FILL — FLUoxetine HCL 40 MG CAPS: 40 | 90 days supply | Qty: 90 | Fill #0

## 2018-01-21 ENCOUNTER — Ambulatory Visit: Payer: 59 | Admitting: Physical Therapy

## 2018-01-21 ENCOUNTER — Institutional Professional Consult (permissible substitution): Payer: 59 | Admitting: Neurology

## 2018-01-23 ENCOUNTER — Ambulatory Visit: Payer: 59 | Admitting: Rehabilitation

## 2018-01-24 DIAGNOSIS — F411 Generalized anxiety disorder: Secondary | ICD-10-CM | POA: Diagnosis not present

## 2018-01-24 DIAGNOSIS — F331 Major depressive disorder, recurrent, moderate: Secondary | ICD-10-CM | POA: Diagnosis not present

## 2018-02-06 ENCOUNTER — Ambulatory Visit
Admission: RE | Admit: 2018-02-06 | Discharge: 2018-02-06 | Disposition: A | Discharge status: Home / Self Care | Payer: 59 | Source: Ambulatory Visit | Attending: Family Medicine | Admitting: Family Medicine

## 2018-02-06 DIAGNOSIS — Z780 Asymptomatic menopausal state: Secondary | ICD-10-CM | POA: Diagnosis not present

## 2018-02-06 DIAGNOSIS — Z1231 Encounter for screening mammogram for malignant neoplasm of breast: Secondary | ICD-10-CM | POA: Diagnosis not present

## 2018-02-06 DIAGNOSIS — Z13820 Encounter for screening for osteoporosis: Secondary | ICD-10-CM | POA: Diagnosis not present

## 2018-02-06 DIAGNOSIS — Z139 Encounter for screening, unspecified: Secondary | ICD-10-CM

## 2018-02-07 DIAGNOSIS — F411 Generalized anxiety disorder: Secondary | ICD-10-CM | POA: Diagnosis not present

## 2018-02-07 DIAGNOSIS — F331 Major depressive disorder, recurrent, moderate: Secondary | ICD-10-CM | POA: Diagnosis not present

## 2018-02-16 MED FILL — VYVANSE 70 MG CAPSULE: 70 | 30 days supply | Qty: 30 | Fill #0

## 2018-02-16 MED FILL — ARMODAFINIL 250 MG TABLET: 250 | 30 days supply | Qty: 30 | Fill #0

## 2018-02-21 DIAGNOSIS — F331 Major depressive disorder, recurrent, moderate: Secondary | ICD-10-CM | POA: Diagnosis not present

## 2018-02-21 DIAGNOSIS — F411 Generalized anxiety disorder: Secondary | ICD-10-CM | POA: Diagnosis not present

## 2018-02-28 DIAGNOSIS — F411 Generalized anxiety disorder: Secondary | ICD-10-CM | POA: Diagnosis not present

## 2018-02-28 DIAGNOSIS — F331 Major depressive disorder, recurrent, moderate: Secondary | ICD-10-CM | POA: Diagnosis not present

## 2018-03-04 ENCOUNTER — Other Ambulatory Visit: Payer: Self-pay

## 2018-03-05 ENCOUNTER — Other Ambulatory Visit: Payer: Self-pay

## 2018-03-05 NOTE — Patient Outreach (Signed)
Second attempt to obtain mRs. No answer. Left message for return call.  

## 2018-03-06 ENCOUNTER — Other Ambulatory Visit: Payer: Self-pay

## 2018-03-06 DIAGNOSIS — Z9884 Bariatric surgery status: Secondary | ICD-10-CM | POA: Diagnosis not present

## 2018-03-06 DIAGNOSIS — K912 Postsurgical malabsorption, not elsewhere classified: Secondary | ICD-10-CM | POA: Diagnosis not present

## 2018-03-06 DIAGNOSIS — R69 Illness, unspecified: Secondary | ICD-10-CM | POA: Diagnosis not present

## 2018-03-06 DIAGNOSIS — M8580 Other specified disorders of bone density and structure, unspecified site: Secondary | ICD-10-CM | POA: Diagnosis not present

## 2018-03-06 DIAGNOSIS — R5383 Other fatigue: Secondary | ICD-10-CM | POA: Diagnosis not present

## 2018-03-06 NOTE — Patient Outreach (Signed)
3 outreach attempts were completed to obtain mRs. mRs could not be obtained because patient never returned my calls. mRs=7 

## 2018-03-07 DIAGNOSIS — F411 Generalized anxiety disorder: Secondary | ICD-10-CM | POA: Diagnosis not present

## 2018-03-07 DIAGNOSIS — F331 Major depressive disorder, recurrent, moderate: Secondary | ICD-10-CM | POA: Diagnosis not present

## 2018-03-17 MED FILL — ARMODAFINIL 250 MG TABLET: 250 | 30 days supply | Qty: 30 | Fill #1

## 2018-03-17 MED FILL — VYVANSE 70 MG CAPSULE: 70 | 30 days supply | Qty: 30 | Fill #0

## 2018-03-18 DIAGNOSIS — F411 Generalized anxiety disorder: Secondary | ICD-10-CM | POA: Diagnosis not present

## 2018-03-18 DIAGNOSIS — F331 Major depressive disorder, recurrent, moderate: Secondary | ICD-10-CM | POA: Diagnosis not present

## 2018-04-04 DIAGNOSIS — F331 Major depressive disorder, recurrent, moderate: Secondary | ICD-10-CM | POA: Diagnosis not present

## 2018-04-04 DIAGNOSIS — F411 Generalized anxiety disorder: Secondary | ICD-10-CM | POA: Diagnosis not present

## 2018-04-09 ENCOUNTER — Ambulatory Visit: Payer: 59 | Admitting: Neurology

## 2018-04-09 ENCOUNTER — Telehealth: Payer: Self-pay | Admitting: Neurology

## 2018-04-09 ENCOUNTER — Encounter: Payer: Self-pay | Admitting: Neurology

## 2018-04-09 VITALS — BP 129/82 | HR 89 | Ht 66.5 in | Wt 163.0 lb

## 2018-04-09 DIAGNOSIS — Z9884 Bariatric surgery status: Secondary | ICD-10-CM

## 2018-04-09 NOTE — Telephone Encounter (Signed)
Patient had a 8:30 am apt time. I was skyped from the front desk at 8:35 that the patient showed up asking if the patient could still be seen. At this point the 9 am had already been brought back and Dr Brett Fairy was in to see them. The patient was informed at the front that Dr Brett Fairy would still see her but it would be after the patient that was already present. Patient was upset and said that she was told 8:30 am for apt, the front brought to her attention that it was 8:35 am and the patient stated "well that is not too late."   I proceeded to check the patient in while she was waiting the patient at 9:21 left. Her husband stated that she had to leave to go start seeing patients. Informed them to let them know at the front and reschedule.

## 2018-04-10 ENCOUNTER — Encounter: Payer: Self-pay | Admitting: Neurology

## 2018-04-10 NOTE — Progress Notes (Signed)
Patient was not seen- CD [place] [facility]

## 2018-04-10 NOTE — Patient Instructions (Signed)
Not seen- late arrival.

## 2018-04-13 MED FILL — VYVANSE 70 MG CAPSULE: 70 | 30 days supply | Qty: 30 | Fill #0

## 2018-04-13 MED FILL — ARMODAFINIL 250 MG TABLET: 250 | 30 days supply | Qty: 30 | Fill #2

## 2018-04-23 DIAGNOSIS — F331 Major depressive disorder, recurrent, moderate: Secondary | ICD-10-CM | POA: Diagnosis not present

## 2018-04-23 DIAGNOSIS — F411 Generalized anxiety disorder: Secondary | ICD-10-CM | POA: Diagnosis not present

## 2018-04-25 DIAGNOSIS — F331 Major depressive disorder, recurrent, moderate: Secondary | ICD-10-CM | POA: Diagnosis not present

## 2018-04-25 DIAGNOSIS — F411 Generalized anxiety disorder: Secondary | ICD-10-CM | POA: Diagnosis not present

## 2018-05-01 MED FILL — buPROPion HCL ER (XL) 300 M: 300 | 90 days supply | Qty: 90 | Fill #1

## 2018-05-01 MED FILL — FLUoxetine HCL 40 MG CAPS: 40 | 90 days supply | Qty: 90 | Fill #1

## 2018-05-11 MED FILL — ARMODAFINIL 250 MG TABLET: 250 | 90 days supply | Qty: 90 | Fill #0

## 2018-05-12 DIAGNOSIS — F411 Generalized anxiety disorder: Secondary | ICD-10-CM | POA: Diagnosis not present

## 2018-05-12 DIAGNOSIS — F331 Major depressive disorder, recurrent, moderate: Secondary | ICD-10-CM | POA: Diagnosis not present

## 2018-05-12 MED FILL — VYVANSE 70 MG CAPSULE: 70 | 30 days supply | Qty: 30 | Fill #0

## 2018-05-16 DIAGNOSIS — F411 Generalized anxiety disorder: Secondary | ICD-10-CM | POA: Diagnosis not present

## 2018-05-16 DIAGNOSIS — F331 Major depressive disorder, recurrent, moderate: Secondary | ICD-10-CM | POA: Diagnosis not present

## 2018-06-05 MED FILL — ATOMOXETINE HCL 40 MG CAPS: 40 | 30 days supply | Qty: 60 | Fill #0

## 2018-06-10 MED FILL — VYVANSE 70 MG CAPSULE: 70 | 30 days supply | Qty: 30 | Fill #0

## 2018-06-20 DIAGNOSIS — F331 Major depressive disorder, recurrent, moderate: Secondary | ICD-10-CM | POA: Diagnosis not present

## 2018-06-20 DIAGNOSIS — F411 Generalized anxiety disorder: Secondary | ICD-10-CM | POA: Diagnosis not present

## 2018-06-23 DIAGNOSIS — F331 Major depressive disorder, recurrent, moderate: Secondary | ICD-10-CM | POA: Diagnosis not present

## 2018-06-23 DIAGNOSIS — F411 Generalized anxiety disorder: Secondary | ICD-10-CM | POA: Diagnosis not present

## 2018-06-24 NOTE — Progress Notes (Signed)
NEUROLOGY CONSULTATION NOTE  Jennifer Mayo MRN: [id number] DOB: 21-Jan-1976  Referring provider: Ferd Hibbs, NP Primary care provider: Ferd Hibbs, NP  Reason for consult:  History of stroke  HISTORY OF PRESENT ILLNESS: Jennifer Mayo is a 43 year old right-handed Caucasian woman with CHF, microvascular coronary artery disease, obstructive sleep apnea, narcolepsy, status post bariatric surgery and history of endometrial cancer who presents to establish care for history of stroke.  History supplemented by prior neurology notes.  She is accompanied by her husband who also supplements history.  The patient was admitted to [facility] on 11/19/17 after presenting with right sided weakness involving the face, hand and leg.  NIH stroke scale was 3 due to mild right sided weakness of face, upper and lower extremities.  She received tPA.  There was no associated headache, unilateral numbness, aphasia, or visual disturbance.  MRI of brain personally reviewed and negative for acute stroke.  CTA of head and neck personally reviewed and demonstrated no emergent or significant large vessel occlusion or stenosis.  2D echo showed EF 55-60% with no cardiac source of embolus,  Due to recent prolonged car ride, she had lower extremity venous doppler performed, which was negative for DVT.  LDL was 72 and Hgb A1c 5.2.  HIV testing was negative.  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  Since then, she has a mild residual right sided heaviness, unsteadiness and memory and word-finding issues.  Since hospitalization, she has had a constant left sided pressure-like to sharp headache that fluctuates in intensity from mild to severe.  It becomes severe 5-10 minutes about 3 times a day.  No associated symptoms such as nausea, photophobia, phonophobia, visual disturbance or worsening numbness or weakness.  She was subsequently taken off of ASA due to consideration that the  event was migraine. MRI of brain with and without contrast was performed as a follow up which was personally reviewed and was normal.   A couple of months ago, she developed mild left sided weakness of arm and leg for 15 minutes.  On her own, she restarted ASA every other day.  Yesterday, she was at work and her right arm and hand felt weak with difficulty writing, lasting 60-90 minutes.  She denies prior history of migraines.  In hindsight, she had an event in 1999 in which she developed a visual aura described as a crayon scribble that slowly grows to cover visual field of both eyes, lasting 30 minutes.  It occurred a couple of years later, lasting 45 minutes.  Both events were not associated with headache.  She was diagnosed with microvascular cardiac disease.  She takes  Wellbutrin XL, Strattera, Prozac, Vyvanse Past medications include:  Amitriptyline (side effects), topiramate (side effects), beta blockers (hypotension)  PAST MEDICAL HISTORY: Past Medical History:  Diagnosis Date  . Anemia   . Cancer [facility])    endometrial  . Cardiac microvascular disease   . CHF (congestive heart failure) ([place])   . Depression   . History of endometrial cancer    had hysterectomy in August 2015  . Mild diastolic dysfunction   . Narcolepsy   . Obstructive sleep apnea   . Pleural effusion, bilateral    3-4 years   . Solitary pulmonary nodule on lung CT    follow up CT recommended per medical records  . Thyroid disease   . Vitamin B 12 deficiency     PAST SURGICAL HISTORY: Past Surgical History:  Procedure Laterality Date  . ABDOMINAL HYSTERECTOMY    . CHOLECYSTECTOMY    . 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 UPPER GI ENDOSCOPY  07/01/2016   Procedure: UPPER GI  ENDOSCOPY;  Surgeon: Arta Bruce Kinsinger, MD;  Location: [facility];  Service: General;;    MEDICATIONS: Current Outpatient Medications on File Prior to Visit  Medication Sig Dispense Refill  . Armodafinil 250 MG tablet Take 250 mg by mouth daily.    Marland Kitchen aspirin EC 81 MG tablet Take 1 tablet (81 mg total) by mouth daily. 150 tablet 2  . Calcium Carb-Cholecalciferol (CALCIUM 1000 + D PO) Take 1 tablet by mouth 3 (three) times daily.    Marland Kitchen FLUoxetine (PROZAC) 40 MG capsule Take 40 mg by mouth daily.    . Levomefolate Glucosamine (METHYLFOLATE) 400 MCG CAPS Take 1 capsule by mouth daily.    Marland Kitchen lisdexamfetamine (VYVANSE) 70 MG capsule Take 70 mg by mouth daily.    . Multiple Vitamins-Minerals (BARIATRIC MULTIVITAMINS/IRON PO) Take 1 tablet by mouth 2 (two) times daily.     No current facility-administered medications on file prior to visit.     ALLERGIES: Allergies  Allergen Reactions  . Ranexa [Ranolazine] Shortness Of Breath  . Nsaids     Gastric bypass - ulcer risk  . Verapamil Other (See Comments)    hypotension    FAMILY HISTORY: Family History  Problem Relation Age of Onset  . Cancer Mother   . Brain cancer Mother   . Seizures Mother   . Stroke Mother   . Breast cancer Mother 68  . Diabetes Father    SOCIAL HISTORY: Social History   Socioeconomic History  . Marital status: Married    Spouse name: Not on file  . Number of children: Not on file  . Years of education: Not on file  . Highest education level: Not on file  Occupational History  . Not on file  Social Needs  . Financial resource strain: Not on file  . Food insecurity:    Worry: Not on file    Inability: Not on file  . Transportation needs:    Medical: Not on file    Non-medical: Not on file  Tobacco Use  . Smoking status: Never Smoker  . Smokeless tobacco: Never Used  Substance and Sexual Activity  . Alcohol use: Yes    Alcohol/week: 0.0 standard drinks    Comment: very seldom  . Drug use: No  .  Sexual activity: Not on file  Lifestyle  . Physical activity:    Days per week: Not on file    Minutes per session: Not on file  . Stress: Not on file  Relationships  . Social connections:    Talks on phone: Not on file    Gets together: Not on file    Attends religious service: Not on file    Active member of club or organization: Not on file    Attends meetings of clubs or organizations: Not on file    Relationship status: Not on file  . Intimate partner violence:    Fear of current or ex partner: Not on file    Emotionally abused: Not on file    Physically abused: Not on file    Forced sexual activity: Not on file  Other Topics Concern  . Not on file  Social History Narrative   One cup of caffeine daily.    REVIEW OF SYSTEMS: Constitutional: No fevers, chills, or sweats, no generalized fatigue, change in appetite Eyes: No visual changes, double vision, eye pain Ear, nose and throat: No hearing loss, ear pain, nasal congestion, sore throat Cardiovascular: No chest pain, palpitations Respiratory:  No shortness of breath at rest or with exertion, wheezes GastrointestinaI: No nausea, vomiting, diarrhea, abdominal pain, fecal incontinence Genitourinary:  No dysuria, urinary retention or frequency Musculoskeletal:  No neck pain, back pain Integumentary: No rash, pruritus, skin lesions Neurological: as above Psychiatric: No depression, insomnia, anxiety Endocrine: No palpitations, fatigue, diaphoresis, mood swings, change in appetite, change in weight, increased thirst Hematologic/Lymphatic:  No purpura, petechiae. Allergic/Immunologic: no itchy/runny eyes, nasal congestion, recent allergic reactions, rashes  PHYSICAL EXAM: Blood pressure 128/84, pulse 96, height 5' 6.5" (1.689 m), weight 169 lb (76.7 kg), SpO2 100 %. General: No acute distress.  Patient appears well-groomed.   Head:  Normocephalic/atraumatic Eyes:  fundi examined but not visualized Neck: supple, no  paraspinal tenderness, full range of motion Back: No paraspinal tenderness Heart: regular rate and rhythm Lungs: Clear to auscultation bilaterally. Vascular: No carotid bruits. Neurological Exam: Mental status: alert and oriented to person, place, and time, recent and remote memory intact, fund of knowledge intact, attention and concentration intact, speech fluent and not dysarthric, language intact. Cranial nerves: CN I: not tested CN II: pupils equal, round and reactive to light, visual fields intact CN III, IV, VI:  full range of motion, no nystagmus, no ptosis CN V: facial sensation intact CN VII: upper and lower face symmetric CN VIII: hearing intact CN IX, X: gag intact, uvula midline CN XI: sternocleidomastoid and trapezius muscles intact CN XII: tongue midline Bulk & Tone: normal, no fasciculations. Motor:  5/5 throughout  Sensation:  temperature and vibration sensation intact. Deep Tendon Reflexes:  2+ throughout, toes downgoing.   Finger to nose testing:  Without dysmetria.   Heel to shin:  Without dysmetria.   Gait:  Normal station and stride.  Able to turn and tandem walk. Romberg negative  IMPRESSION: 1.  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 She has never fully recovered from initial event last summer.  I am not sure why she endorses mild residual symptoms.  Repeat MRI of brain over a month later was completely normal.  She has associated fluctuating persistent headache suggesting ongoing chronic migraine.  Since she has had two other stroke-like events without headache, repeat brain imaging is warranted.  Recurrent TIAs are still possible, but given no change in brain MRI, associated headaches and past history consistent with ocular migraines, my suspicion is still migraine.  PLAN: 1.  Check MRI of brain with and without contrast to evaluate for any changes.  2.  Start Aimovig 70mg  monthly 3.  Limit use of pain relievers to no more than 2 days out of week to prevent risk of  rebound or medication-overuse headache. 4.  Keep headache diary 5.  Follow up in 4 months.  Thank you for allowing me to take part in the care of this patient.  Metta Clines, DO  CC: Ferd Hibbs, NP

## 2018-06-25 ENCOUNTER — Encounter

## 2018-06-26 ENCOUNTER — Other Ambulatory Visit: Payer: Self-pay

## 2018-06-26 ENCOUNTER — Ambulatory Visit: Payer: 59 | Admitting: Neurology

## 2018-06-26 ENCOUNTER — Encounter: Payer: Self-pay | Admitting: Neurology

## 2018-06-26 VITALS — BP 128/84 | HR 96 | Ht 66.5 in | Wt 169.0 lb

## 2018-06-26 DIAGNOSIS — R2990 Unspecified symptoms and signs involving the nervous system: Secondary | ICD-10-CM

## 2018-06-26 DIAGNOSIS — I639 Cerebral infarction, unspecified: Secondary | ICD-10-CM

## 2018-06-26 DIAGNOSIS — G43409 Hemiplegic migraine, not intractable, without status migrainosus: Secondary | ICD-10-CM

## 2018-06-26 MED ORDER — ERENUMAB-AOOE 70 MG/ML ~~LOC~~ SOAJ: 1.0000 | SUBCUTANEOUS | 3 refills | Status: DC

## 2018-06-26 NOTE — Patient Instructions (Addendum)
1.  We will check another MRI of the brain with and without contrast.   We have sent a referral to [facility] for your MRI and they will call you directly to schedule your appt. They are located at [place]. If you need to contact them directly please call [phone].  Further recommendations pending results. 2.  We will start Aimovig 70mg  monthly injection 3.  Limit use of pain relievers to no more than 2 days out of week to prevent risk of rebound or medication-overuse headache. 4.  Keep headache diary 5.  Exercise, hydration 6.  Follow up in 4 months.

## 2018-07-08 MED FILL — VYVANSE 70 MG CAPSULE: 70 | 30 days supply | Qty: 30 | Fill #0

## 2018-07-21 IMAGING — DX DG CHEST 1V PORT
1 series · 1 of 1 positions shown · non-contrast
Comparison: Earlier same day

CLINICAL DATA: Central line placement

EXAM:
PORTABLE CHEST 1 VIEW

[chest ap]
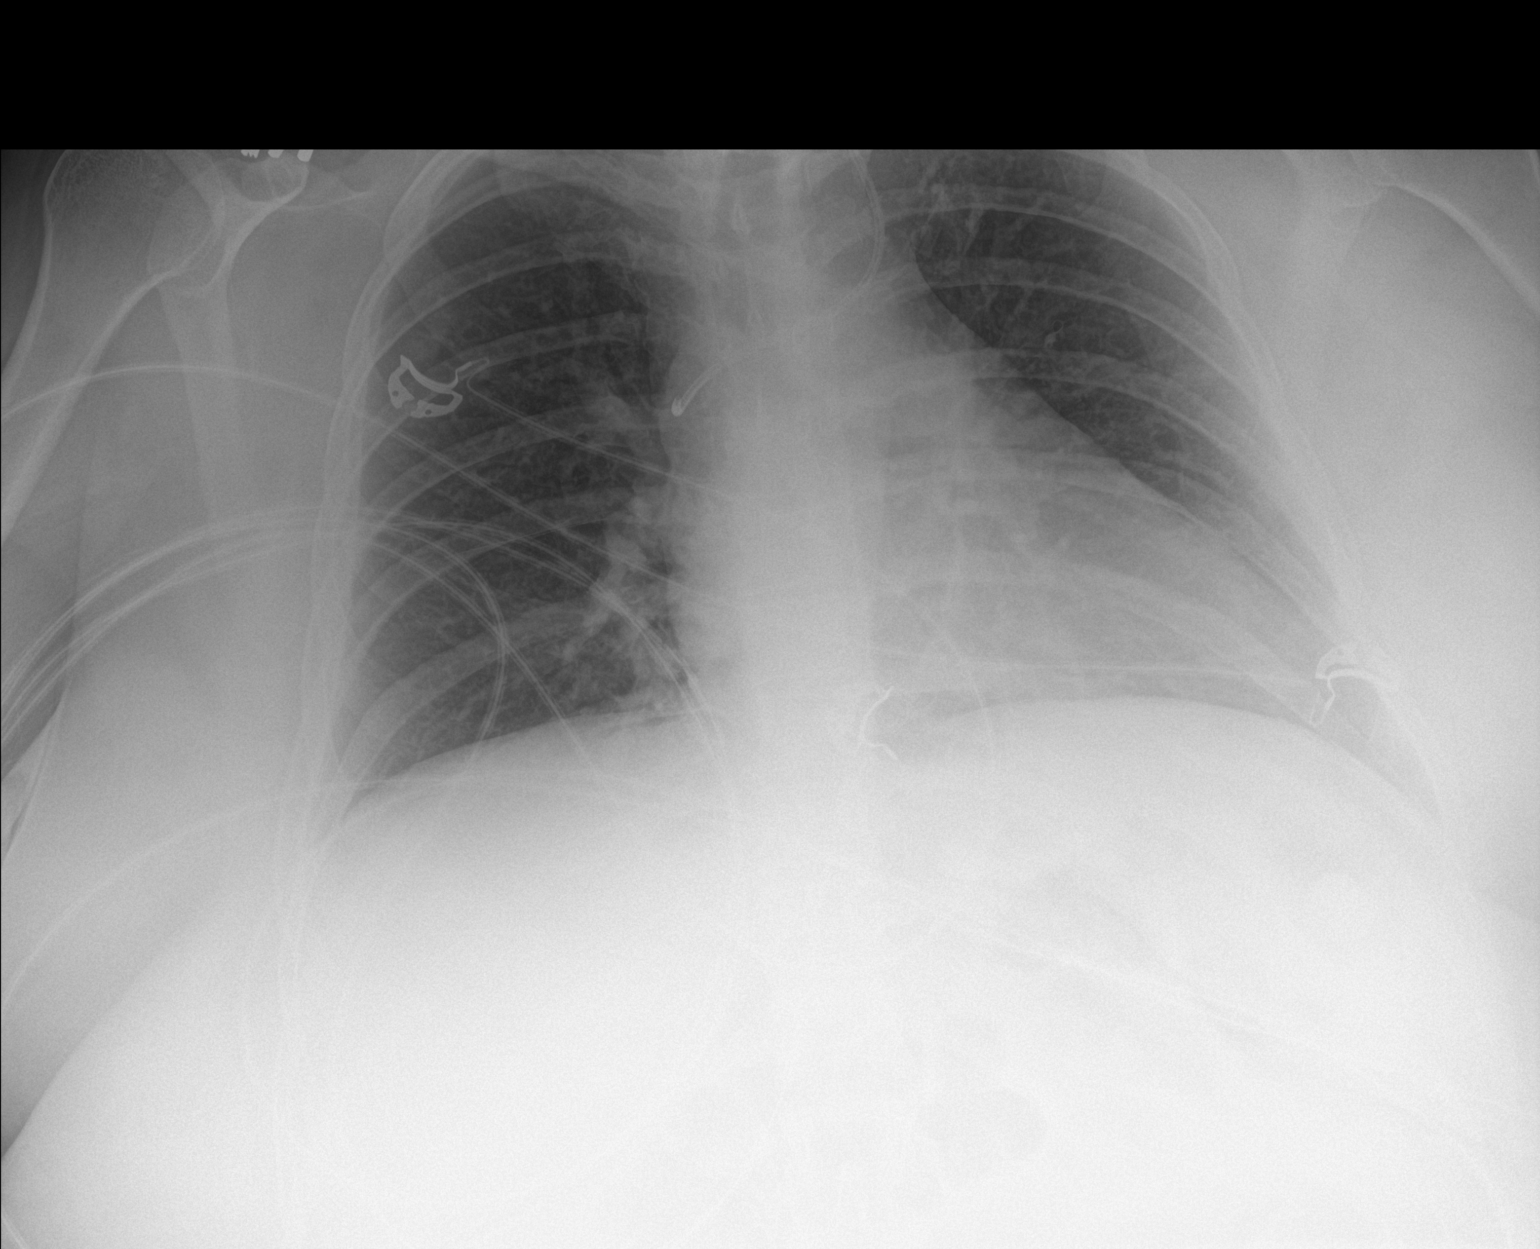

[1 of 1 positions shown; findings below may reference images not displayed]

FINDINGS: Left internal jugular central line has its tip either in the SVC at
the azygos level or within the azygos vein itself. No pneumothorax.
Minimal left base atelectasis.
IMPRESSION: Left internal jugular central line tip either in the SVC at the
azygos level or in the azygos vein. No pneumothorax.

## 2018-07-22 IMAGING — DX DG CHEST 1V PORT
1 series · 1 of 1 positions shown · non-contrast
Comparison: 10/20/2016.

CLINICAL DATA: Respiratory failure.

EXAM:
PORTABLE CHEST 1 VIEW

[chest ap]
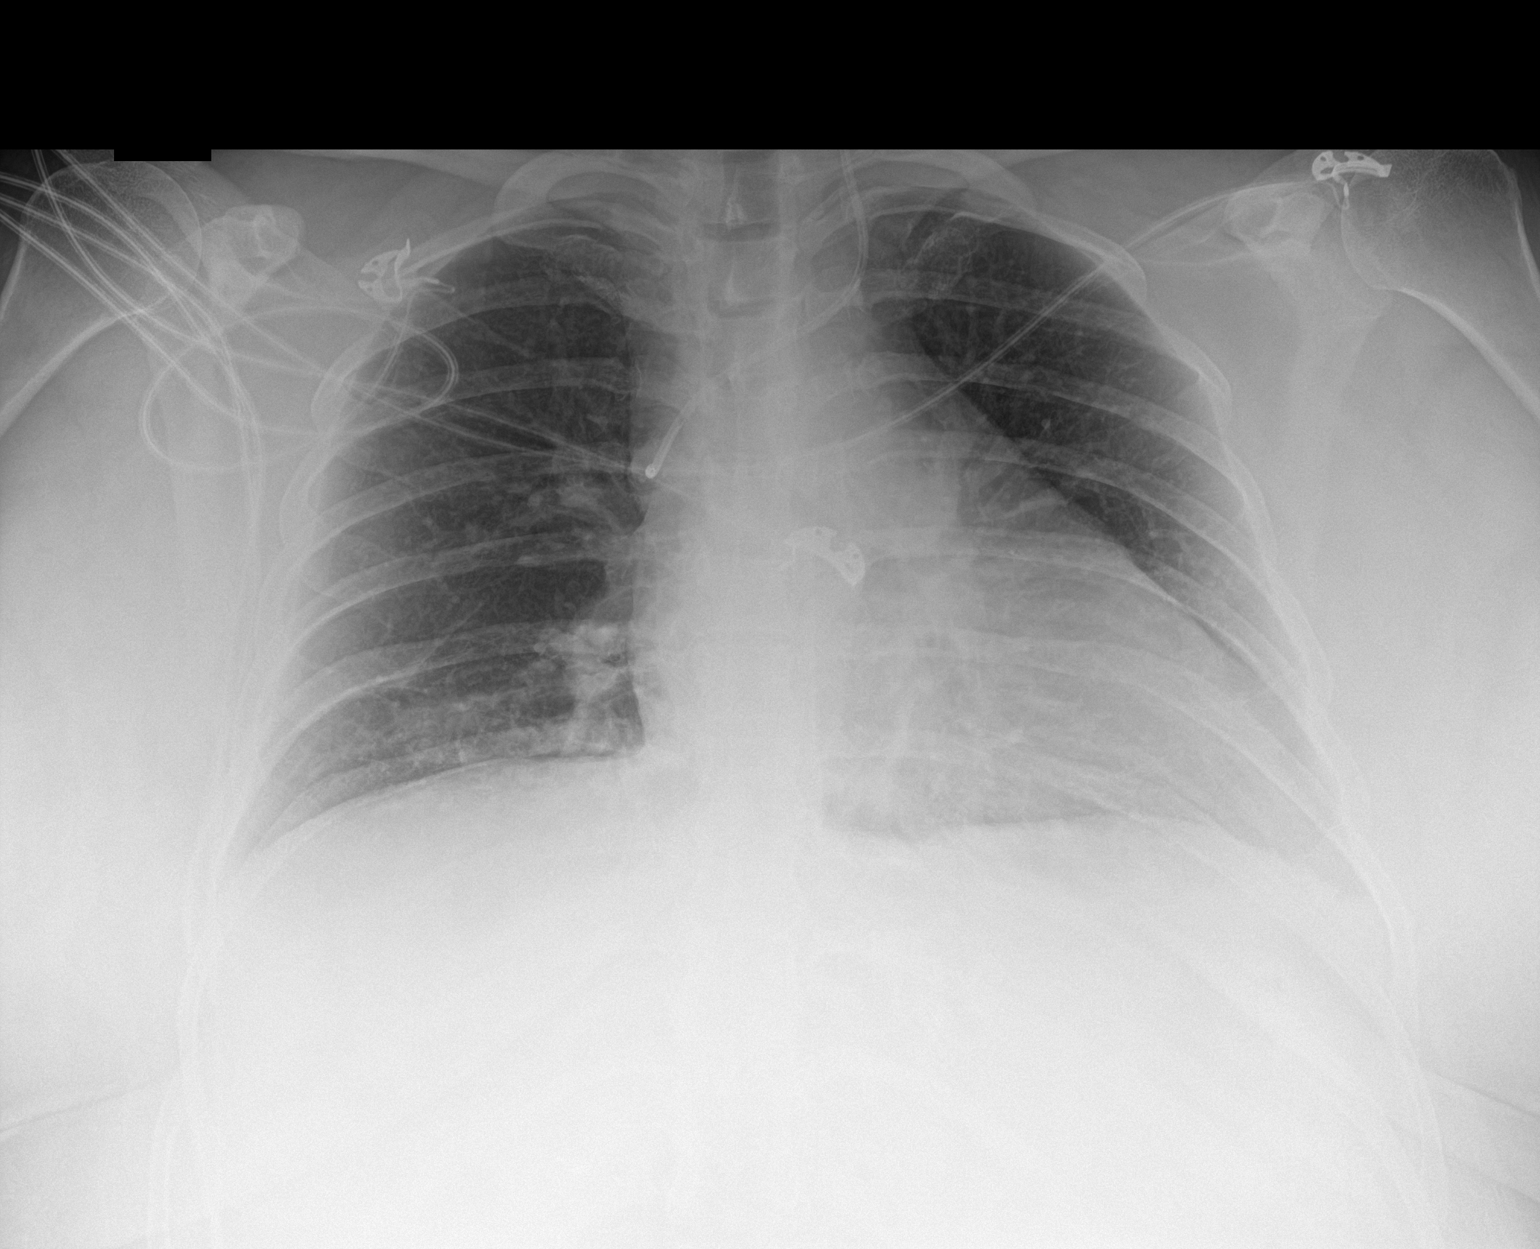

[1 of 1 positions shown; findings below may reference images not displayed]

FINDINGS: Left IJ line in stable position. Heart size normal. Mild bibasilar
subsegmental atelectasis. Small left pleural effusion cannot be
excluded. No pneumothorax.
IMPRESSION: 1. Left IJ line stable position.

2. Mild bibasilar atelectasis. Small left pleural effusion cannot be
excluded.

## 2018-07-28 NOTE — Progress Notes (Signed)
Rcvd fax from [facility]. Aimovig 70 mg approved for 6 refills from  06/26/18 - 12/26/18   Must be filled through a [facility] pharmacy and Pt must experience a reduction in of migraine or headaches of at least 2 days per month. OR a reduction in severity OR duration with Aimovig therapy for renewal

## 2018-08-07 ENCOUNTER — Other Ambulatory Visit: Payer: Self-pay

## 2018-08-07 ENCOUNTER — Ambulatory Visit
Admission: RE | Admit: 2018-08-07 | Discharge: 2018-08-07 | Disposition: A | Discharge status: Home / Self Care | Payer: 59 | Source: Ambulatory Visit | Attending: Neurology | Admitting: Neurology

## 2018-08-07 DIAGNOSIS — R2990 Unspecified symptoms and signs involving the nervous system: Secondary | ICD-10-CM

## 2018-08-07 DIAGNOSIS — I639 Cerebral infarction, unspecified: Secondary | ICD-10-CM | POA: Diagnosis not present

## 2018-08-07 DIAGNOSIS — G43409 Hemiplegic migraine, not intractable, without status migrainosus: Secondary | ICD-10-CM

## 2018-08-07 MED ORDER — GADOBENATE DIMEGLUMINE 529 MG/ML IV SOLN
15.0000 mL | Freq: Once | INTRAVENOUS | Status: AC | PRN
  Administered 2018-08-07: 15 mL via INTRAVENOUS

## 2018-08-10 MED FILL — VYVANSE 70 MG CAPSULE: 70 | 30 days supply | Qty: 30 | Fill #0

## 2018-08-21 MED FILL — ATOMOXETINE HCL 80 MG CAPS: 80 | 90 days supply | Qty: 90 | Fill #0

## 2018-08-21 MED FILL — FLUoxetine HCL 20 MG CAPS: 20 | 90 days supply | Qty: 270 | Fill #0

## 2018-08-21 MED FILL — MODAFINIL 200 MG TABLET: 200 | 90 days supply | Qty: 180 | Fill #0

## 2018-08-27 ENCOUNTER — Emergency Department (HOSPITAL_COMMUNITY): Payer: 59

## 2018-08-27 ENCOUNTER — Other Ambulatory Visit: Payer: Self-pay

## 2018-08-27 ENCOUNTER — Emergency Department (HOSPITAL_COMMUNITY)
Admission: EM | Admit: 2018-08-27 | Discharge: 2018-08-27 | Disposition: A | Discharge status: Home / Self Care | Payer: 59 | Attending: Emergency Medicine | Admitting: Emergency Medicine

## 2018-08-27 DIAGNOSIS — K219 Gastro-esophageal reflux disease without esophagitis: Secondary | ICD-10-CM | POA: Insufficient documentation

## 2018-08-27 DIAGNOSIS — R42 Dizziness and giddiness: Secondary | ICD-10-CM | POA: Diagnosis not present

## 2018-08-27 DIAGNOSIS — R0789 Other chest pain: Secondary | ICD-10-CM | POA: Insufficient documentation

## 2018-08-27 DIAGNOSIS — Z79899 Other long term (current) drug therapy: Secondary | ICD-10-CM | POA: Diagnosis not present

## 2018-08-27 DIAGNOSIS — R4702 Dysphasia: Secondary | ICD-10-CM

## 2018-08-27 DIAGNOSIS — R531 Weakness: Secondary | ICD-10-CM | POA: Diagnosis not present

## 2018-08-27 DIAGNOSIS — R29810 Facial weakness: Secondary | ICD-10-CM | POA: Diagnosis not present

## 2018-08-27 DIAGNOSIS — G43409 Hemiplegic migraine, not intractable, without status migrainosus: Secondary | ICD-10-CM | POA: Diagnosis not present

## 2018-08-27 DIAGNOSIS — I10 Essential (primary) hypertension: Secondary | ICD-10-CM | POA: Insufficient documentation

## 2018-08-27 DIAGNOSIS — G43419 Hemiplegic migraine, intractable, without status migrainosus: Secondary | ICD-10-CM | POA: Diagnosis not present

## 2018-08-27 LAB — COMPREHENSIVE METABOLIC PANEL
ALT: 35 U/L (ref 0–44)
AST: 30 U/L (ref 15–41)
Albumin: 3.8 g/dL (ref 3.5–5.0)
Alkaline Phosphatase: 81 U/L (ref 38–126)
Anion gap: 11 (ref 5–15)
BUN: 15 mg/dL (ref 6–20)
CO2: 24 mmol/L (ref 22–32)
Calcium: 9.2 mg/dL (ref 8.9–10.3)
Chloride: 102 mmol/L (ref 98–111)
Creatinine, Ser: 0.79 mg/dL (ref 0.44–1.00)
GFR calc Af Amer: >60 mL/min (ref >60)
GFR calc non Af Amer: >60 mL/min (ref >60)
Glucose, Bld: 129 mg/dL — ABNORMAL HIGH (ref 70–99)
Potassium: 3.6 mmol/L (ref 3.5–5.1)
Sodium: 137 mmol/L (ref 135–145)
Total Bilirubin: 0.5 mg/dL (ref 0.3–1.2)
Total Protein: 6.3 g/dL — ABNORMAL LOW (ref 6.5–8.1)

## 2018-08-27 LAB — ETHANOL: Alcohol, Ethyl (B): <10 mg/dL (ref <10)

## 2018-08-27 LAB — CBC
HCT: 44.3 % (ref 36.0–46.0)
Hemoglobin: 14.2 g/dL (ref 12.0–15.0)
MCH: 28.4 pg (ref 26.0–34.0)
MCHC: 32.1 g/dL (ref 30.0–36.0)
MCV: 88.6 fL (ref 80.0–100.0)
Platelets: 194 10*3/uL (ref 150–400)
RBC: 5 MIL/uL (ref 3.87–5.11)
RDW: 12.3 % (ref 11.5–15.5)
WBC: 6.9 10*3/uL (ref 4.0–10.5)
nRBC: 0 % (ref 0.0–0.2)

## 2018-08-27 LAB — I-STAT BETA HCG BLOOD, ED (MC, WL, AP ONLY): I-stat hCG, quantitative: <5 m[IU]/mL (ref <5)

## 2018-08-27 LAB — DIFFERENTIAL
Abs Immature Granulocytes: 0.02 10*3/uL (ref 0.00–0.07)
Basophils Absolute: 0 10*3/uL (ref 0.0–0.1)
Basophils Relative: 1 %
Eosinophils Absolute: 0.1 10*3/uL (ref 0.0–0.5)
Eosinophils Relative: 1 %
Immature Granulocytes: 0 %
Lymphocytes Relative: 38 %
Lymphs Abs: 2.6 10*3/uL (ref 0.7–4.0)
Monocytes Absolute: 0.5 10*3/uL (ref 0.1–1.0)
Monocytes Relative: 8 %
Neutro Abs: 3.6 10*3/uL (ref 1.7–7.7)
Neutrophils Relative %: 52 %

## 2018-08-27 LAB — APTT: aPTT: 28 seconds (ref 24–36)

## 2018-08-27 LAB — I-STAT CREATININE, ED: Creatinine, Ser: 0.7 mg/dL (ref 0.44–1.00)

## 2018-08-27 LAB — PROTIME-INR
INR: 1.1 (ref 0.8–1.2)
Prothrombin Time: 14.5 seconds (ref 11.4–15.2)

## 2018-08-27 MED ORDER — PROMETHAZINE HCL 25 MG PO TABS
25.0000 mg | ORAL_TABLET | Freq: Once | ORAL | Status: DC
  Filled 2018-08-27: qty 1

## 2018-08-27 MED ORDER — PROMETHAZINE HCL 12.5 MG PO TABS: 12.5000 mg | ORAL_TABLET | Freq: Four times a day (QID) | ORAL | 0 refills | Status: DC | PRN

## 2018-08-27 NOTE — Discharge Instructions (Signed)
You have hemiplegic migraines. Please continue your current meds as prescribed by your neurologist   Take phenergan as needed for headaches.   See your neurologist for follow up in a week   Return to ER if you have worse headaches, weakness, numbness

## 2018-08-27 NOTE — ED Notes (Signed)
Patient verbalizes understanding of discharge instructions. Opportunity for questioning and answers were provided. Pt discharged from ED. 

## 2018-08-27 NOTE — Code Documentation (Signed)
43yo female arriving to [facility] via private vehicle at 1419. 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 She presented to the ED where a code stroke was called for left arm weakness. Patient to CT. CT completed. Stroke team to the bedside. NIHSS 2, see documentation for details and code stroke times. Patient with left arm drift, left sided decreased sensation and stuttering speech on exam. Decision to proceed with STAT MRI. MRI completed and Dr. Cheral Marker made aware. Code stroke canceled. No acute stroke treatment at this time. Bedside handoff with ED RN Venetia Night.

## 2018-08-27 NOTE — ED Notes (Addendum)
Bilateral a/c ivs removed by Tommi Rumps, EMT

## 2018-08-27 NOTE — ED Provider Notes (Signed)
[facility] EMERGENCY DEPARTMENT Provider Note   CSN: [id number] Arrival date & time: 08/27/18  1419    History   Chief Complaint Chief Complaint  Patient presents with   Code Stroke    HPI Fleeta Mayo is a 43 y.o. female hx of CHF, hemiplegic migraines, here presenting with headache, left-sided weakness.  Patient states that on 1:30 PM, she had sudden onset of dizziness as well as weakness on the left side.  She states that she had similar symptoms previously and actually received TPA before.  She has seen neurology previously for this and was diagnosed with hemiplegic migraines but usually her deficits on the right side.  Since the deficits on the left side she decided to call EMS after talking to her doctor.  Of note, patient is a Designer, jewellery.  She denies any fevers or chills or trouble breathing.  Has any sick contacts.  Seen at triage and code stroke was activated in triage.     The history is provided by the patient.    Past Medical History:  Diagnosis Date   Anemia    Cancer [facility])    endometrial   Cardiac microvascular disease    CHF (congestive heart failure) (HCC)    Depression    History of endometrial cancer    had hysterectomy in August 1017   Mild diastolic dysfunction    Narcolepsy    Obstructive sleep apnea    Pleural effusion, bilateral    3-4 years    Solitary pulmonary nodule on lung CT    follow up CT recommended per medical records   Thyroid disease    Vitamin B 12 deficiency     Patient Active Problem List   Diagnosis Date Noted   Hemiplegic migraine 11/21/2017   Cardiac microvascular disease 11/21/2017   Depression 11/21/2017   Stroke-like episode ([place]) s/p IV tPA 11/19/2017   Adrenal insufficiency ([place]) 10/23/2016   Atelectasis    Acute respiratory failure ([place])    Encounter for central line placement    Near syncope 10/19/2016   Hypotension 07/05/2016   Morbid obesity due to excess calories  ([place]) 07/01/2016   Solitary pulmonary nodule on lung CT    Narcolepsy    Vitamin B 12 deficiency    Mild diastolic dysfunction    Obstructive sleep apnea     Past Surgical History:  Procedure Laterality Date   ABDOMINAL HYSTERECTOMY     CHOLECYSTECTOMY     LAPAROSCOPIC ROUX-EN-Y GASTRIC BYPASS WITH HIATAL HERNIA REPAIR N/A 07/01/2016   Procedure: LAPAROSCOPIC ROUX-EN-Y GASTRIC BYPASS WITH HIATAL HERNIA REPAIR, UPPER ENDO;  Surgeon: Arta Bruce Kinsinger, MD;  Location: [facility];  Service: General;  Laterality: N/A;   UPPER GI ENDOSCOPY  07/01/2016   Procedure: UPPER GI ENDOSCOPY;  Surgeon: Arta Bruce Kinsinger, MD;  Location: [facility];  Service: General;;     OB History   No obstetric history on file.      Home Medications    Prior to Admission medications   Medication Sig Start Date End Date Taking? Authorizing Provider  Armodafinil 250 MG tablet Take 250 mg by mouth daily.    [provider]  atomoxetine (STRATTERA) 40 MG capsule  06/05/18   [provider]  buPROPion (WELLBUTRIN XL) 300 MG 24 hr tablet  05/01/18   [provider]  Calcium Carb-Cholecalciferol (CALCIUM 1000 + D PO) Take 1 tablet by mouth 3 (three) times daily.    [provider]  Eduard Roux ([place])  70 MG/ML SOAJ Inject 1 Dose into the skin every 28 (twenty-eight) days. 06/26/18   Tomi Likens, Adam R, DO  FLUoxetine (PROZAC) 40 MG capsule Take 40 mg by mouth daily.    [provider]  Levomefolate Glucosamine (METHYLFOLATE) 400 MCG CAPS Take 1 capsule by mouth daily.    [provider]  lisdexamfetamine (VYVANSE) 70 MG capsule Take 70 mg by mouth daily.    [provider]  Multiple Vitamins-Minerals (BARIATRIC MULTIVITAMINS/IRON PO) Take 1 tablet by mouth 2 (two) times daily.    [provider]  promethazine (PHENERGAN) 12.5 MG tablet Take 1 tablet (12.5 mg total) by mouth every 6 (six) hours as needed for nausea or vomiting. 08/27/18   Drenda Freeze, MD    Family History Family History  Problem Relation Age of Onset   Cancer Mother    Brain cancer Mother    Seizures Mother    Stroke Mother    Breast cancer Mother 83   Diabetes Father     Social History Social History   Tobacco Use   Smoking status: Never Smoker   Smokeless tobacco: Never Used  Substance Use Topics   Alcohol use: Yes    Alcohol/week: 0.0 standard drinks    Comment: very seldom   Drug use: No     Allergies   Ranexa [ranolazine]; Nsaids; Ranexa [ranolazine er]; Topamax [topiramate]; and Verapamil   Review of Systems Review of Systems  Neurological: Positive for dizziness, weakness and numbness.  All other systems reviewed and are negative.    Physical Exam Updated Vital Signs BP 139/87    Pulse 85    Resp 19    Wt 79.4 kg    SpO2 100%    BMI 27.82 kg/m   Physical Exam Vitals signs and nursing note reviewed.  Constitutional:      Appearance: Normal appearance.  HENT:     Head: Normocephalic.     Nose: Nose normal.     Mouth/Throat:     Mouth: Mucous membranes are moist.  Eyes:     Extraocular Movements: Extraocular movements intact.     Pupils: Pupils are equal, round, and reactive to light.  Neck:     Musculoskeletal: Normal range of motion.  Cardiovascular:     Rate and Rhythm: Normal rate and regular rhythm.  Pulmonary:     Effort: Pulmonary effort is normal.     Breath sounds: Normal breath sounds.  Abdominal:     General: Abdomen is flat.     Palpations: Abdomen is soft.  Musculoskeletal: Normal range of motion.  Skin:    General: Skin is warm.     Capillary Refill: Capillary refill takes less than 2 seconds.  Neurological:     General: No focal deficit present.     Mental Status: She is alert.     Comments: ? L facial droop. Strength 4/5 L arm and leg. 5/5 R arm and leg. Nl finger to nose bilaterally. Nl sensation throughout   Psychiatric:        Mood and Affect: Mood normal.        Behavior:  Behavior normal.      ED Treatments / Results  Labs (all labs ordered are listed, but only abnormal results are displayed) Labs Reviewed  COMPREHENSIVE METABOLIC PANEL - Abnormal; Notable for the following components:      Result Value   Glucose, Bld 129 (*)    Total Protein 6.3 (*)    All other components  within normal limits  ETHANOL  PROTIME-INR  APTT  CBC  DIFFERENTIAL  RAPID URINE DRUG SCREEN, HOSP PERFORMED  URINALYSIS, ROUTINE W REFLEX MICROSCOPIC  I-STAT CREATININE, ED  I-STAT BETA HCG BLOOD, ED ([facility], [facility], [facility] ONLY)    EKG EKG Interpretation  Date/Time:  Thursday Aug 27 2018 15:37:36 EDT Ventricular Rate:  86 PR Interval:    QRS Duration: 106 QT Interval:  400 QTC Calculation: 479 R Axis:   2 Text Interpretation:  Sinus rhythm Left ventricular hypertrophy No significant change since last tracing Confirmed by Wandra Arthurs ([id number]) on 08/27/2018 3:44:18 PM   Radiology Mr Jodene Nam Head Wo Contrast  Result Date: 08/27/2018 CLINICAL DATA:  Acute onset of dizziness today. Right facial droop. Left arm drift. EXAM: MRI HEAD WITHOUT CONTRAST MRA HEAD WITHOUT CONTRAST TECHNIQUE: Multiplanar, multiecho pulse sequences of the brain and surrounding structures were obtained without intravenous contrast. Angiographic images of the head were obtained using MRA technique without contrast. COMPARISON:  Head CT same day. Previous MRI 08/07/2018. Previous MRIs done in August and September of 2019. FINDINGS: MRI HEAD FINDINGS Brain: The brain has a normal appearance without evidence of malformation, atrophy, old or acute small or large vessel infarction, mass lesion, hemorrhage, hydrocephalus or extra-axial collection. Vascular: Major vessels at the base of the brain show flow. Venous sinuses appear patent. Skull and upper cervical spine: Normal. Sinuses/Orbits: Clear/normal. Other: None significant. MRA HEAD FINDINGS Both internal carotid arteries are widely patent into the brain. No siphon  stenosis. The anterior and middle cerebral vessels are patent without proximal stenosis, aneurysm or vascular malformation. A1 segment on the right is congenitally small with a dominant A1 segment on the left. Both vertebral arteries are widely patent to the basilar. No basilar stenosis. Posterior circulation branch vessels appear normal. IMPRESSION: Normal MRI of the brain.  Normal intracranial MR angiography. Electronically Signed   By: Nelson Chimes M.D.   On: 08/27/2018 15:27   Mr Brain Wo Contrast  Result Date: 08/27/2018 CLINICAL DATA:  Acute onset of dizziness today. Right facial droop. Left arm drift. EXAM: MRI HEAD WITHOUT CONTRAST MRA HEAD WITHOUT CONTRAST TECHNIQUE: Multiplanar, multiecho pulse sequences of the brain and surrounding structures were obtained without intravenous contrast. Angiographic images of the head were obtained using MRA technique without contrast. COMPARISON:  Head CT same day. Previous MRI 08/07/2018. Previous MRIs done in August and September of 2019. FINDINGS: MRI HEAD FINDINGS Brain: The brain has a normal appearance without evidence of malformation, atrophy, old or acute small or large vessel infarction, mass lesion, hemorrhage, hydrocephalus or extra-axial collection. Vascular: Major vessels at the base of the brain show flow. Venous sinuses appear patent. Skull and upper cervical spine: Normal. Sinuses/Orbits: Clear/normal. Other: None significant. MRA HEAD FINDINGS Both internal carotid arteries are widely patent into the brain. No siphon stenosis. The anterior and middle cerebral vessels are patent without proximal stenosis, aneurysm or vascular malformation. A1 segment on the right is congenitally small with a dominant A1 segment on the left. Both vertebral arteries are widely patent to the basilar. No basilar stenosis. Posterior circulation branch vessels appear normal. IMPRESSION: Normal MRI of the brain.  Normal intracranial MR angiography. Electronically Signed   By:  Nelson Chimes M.D.   On: 08/27/2018 15:27   Ct Head Code Stroke Wo Contrast  Result Date: 08/27/2018 CLINICAL DATA:  Code stroke. LEFT arm drift, dizziness, facial droop, last seen normal 1330 hours. History of hemiplegic migraine with stroke-like episodes. EXAM: CT HEAD WITHOUT CONTRAST TECHNIQUE:  Contiguous axial images were obtained from the base of the skull through the vertex without intravenous contrast. COMPARISON:  MR brain 08/07/2018 FINDINGS: Brain: No evidence for acute infarction, hemorrhage, mass lesion, hydrocephalus, or extra-axial fluid. Normal cerebral volume. No white matter disease. Vascular: No hyperdense vessel or unexpected calcification. Skull: Normal. Negative for fracture or focal lesion. Sinuses/Orbits: No acute finding. Other: None. ASPECTS [facility] Stroke Program Early CT Score) - Ganglionic level infarction (caudate, lentiform nuclei, internal capsule, insula, M1-M3 cortex): 7 - Supraganglionic infarction (M4-M6 cortex): 3 Total score (0-10 with 10 being normal): 10 IMPRESSION: 1. Negative exam. 2. ASPECTS is 10. * These results were communicated to Dr. Cheral Marker at 2:46 pmon 5/7/2020by text page via the [facility] messaging system. Electronically Signed   By: Staci Righter M.D.   On: 08/27/2018 14:48    Procedures Procedures (including critical care time)  Medications Ordered in ED Medications  promethazine (PHENERGAN) tablet 25 mg (has no administration in time range)     Initial Impression / Assessment and Plan / ED Course  I have reviewed the triage vital signs and the nursing notes.  Pertinent labs & imaging results that were available during my care of the patient were reviewed by me and considered in my medical decision making (see chart for details).       Jennifer Mayo is a 43 y.o. female here presenting with weakness, headaches.  Patient has known hemiplegic migraines.  Patient was seen in triage and code stroke was activated.  Patient was also seen by Dr. Cheral Marker  from neurology.  Per the time I see her, her deficits has improved.  Patient states that during her last episode, her weakness persisted for months on the right side.  Now she has weakness on the left side so she with some headaches.  Her MRI and MRA were unremarkable.  Discussed extensively with Dr. Cheral Marker.  He felt that patient likely had hemiplegic migraines again and recommend some Phenergan.  Patient is already on Strattera as prescribed by her neurologist.  Patient is not currently on any antiplatelet agent.  She had previous gastric bypass so was told that she should not get any NSAIDs.  She was trialed on aspirin previously but her neurologist took her off of and she rather not take it as it may increase her bleeding risk.  I discussed risks and benefits of aspirin with patient and will hold off on aspirin for now and will have her discuss with her neurologist outpatient. Her neurologist can also discuss with her more regarding when her hemiplegia will expect to improve.    Final Clinical Impressions(s) / ED Diagnoses   Final diagnoses:  Hemiplegic migraine without status migrainosus, not intractable    ED Discharge Orders         Ordered    promethazine (PHENERGAN) 12.5 MG tablet  Every 6 hours PRN     08/27/18 1633           Drenda Freeze, MD 08/27/18 [phone]

## 2018-08-27 NOTE — Consult Note (Addendum)
Neurology Consultation  Reason for Consult: Code stroke Referring Physician: Dr. Darl Householder  History is obtained from: Patient  HPI: Jennifer Mayo is a 43 y.o. female with history of CHF, microvascular coronary artery disease, obstructive sleep apnea, narcolepsy, endometrial cancer, and migraine headaches with neurological symptoms.  Patient has been admitted to [facility] in the past on 11/19/2017 after presenting with right-sided weakness involving face arm and leg.  NIH stroke scale that time was 3; patient did receive tPA and MRI of brain did not show any acute infarct.  CTA of head and neck did not show any emergent or significant large vessel disease at that time.  2D echo showed an EF of 55-60% with no cardiac source of embolus.  At that time she was diagnosed with hemiplegic migraine.    Today, patient arrived via EMS from work where she began to feel dizzy. She states that she was about to fall and noted that she had a right facial droop and left arm weakness.  At time of arrival to the ED patient had stuttering speech and no significant weakness in left upper or lower extremity.  Patient was brought to CT scan which was negative and then brought to MRI to further evaluate for stroke prior to possibly giving tPA. Her symptoms at that time were rapidly improving and in addition NIH stroke scale was low at 2  Of note, patient denied any headache at time of evaluation.  ED course labs/CT head/MRI of head/ MRA of head  LKW: 1328 tpa given?: no, NIH stroke scale 2 and rapidly resolving Premorbid modified Rankin scale (mRS): 0 NIH stroke scale 2   ROS: A 14 point ROS was performed and is negative except as noted in the HPI.   Past Medical History:  Diagnosis Date  . Anemia   . Cancer [facility])    endometrial  . Cardiac microvascular disease   . CHF (congestive heart failure) ([place])   . Depression   . History of endometrial cancer    had hysterectomy in August 2015  . Mild diastolic dysfunction   .  Narcolepsy   . Obstructive sleep apnea   . Pleural effusion, bilateral    3-4 years   . Solitary pulmonary nodule on lung CT    follow up CT recommended per medical records  . Thyroid disease   . Vitamin B 12 deficiency     Family History  Problem Relation Age of Onset  . Cancer Mother   . Brain cancer Mother   . Seizures Mother   . Stroke Mother   . Breast cancer Mother 51  . Diabetes Father     Social History:   reports that she has never smoked. She has never used smokeless tobacco. She reports current alcohol use. She reports that she does not use drugs.  Medications No current facility-administered medications for this encounter.   Current Outpatient Medications:  .  Armodafinil 250 MG tablet, Take 250 mg by mouth daily., Disp: , Rfl:  .  atomoxetine (STRATTERA) 40 MG capsule, , Disp: , Rfl:  .  buPROPion (WELLBUTRIN XL) 300 MG 24 hr tablet, , Disp: , Rfl:  .  Calcium Carb-Cholecalciferol (CALCIUM 1000 + D PO), Take 1 tablet by mouth 3 (three) times daily., Disp: , Rfl:  .  Erenumab-aooe (AIMOVIG) 70 MG/ML SOAJ, Inject 1 Dose into the skin every 28 (twenty-eight) days., Disp: 1 pen, Rfl: 3 .  FLUoxetine (PROZAC) 40 MG capsule, Take 40 mg by mouth daily., Disp: ,  Rfl:  .  Levomefolate Glucosamine (METHYLFOLATE) 400 MCG CAPS, Take 1 capsule by mouth daily., Disp: , Rfl:  .  lisdexamfetamine (VYVANSE) 70 MG capsule, Take 70 mg by mouth daily., Disp: , Rfl:  .  Multiple Vitamins-Minerals (BARIATRIC MULTIVITAMINS/IRON PO), Take 1 tablet by mouth 2 (two) times daily., Disp: , Rfl:    Exam: Current vital signs: Wt 79.4 kg   BMI 27.82 kg/m  Vital signs in last 24 hours: Weight:  [79.4 kg] 79.4 kg (05/07 1400)  Physical Exam  Constitutional: Appears well-developed and well-nourished.  Psych: Affect appropriate to situation Eyes: No scleral injection HENT: No OP obstrucion Head: Normocephalic.  Cardiovascular: Normal rate and regular rhythm.  Respiratory: Effort  normal, non-labored breathing GI: Soft.  No distension. There is no tenderness.  Skin: WDI  Neuro: Mental Status: Patient is awake, alert, oriented to person, place, month, year, and situation. Patient is able to give a clear and coherent history. No signs of aphasia or neglect, she did have some stuttering speech that had a non-physiological, waxing-waning and distractable quality. Cranial Nerves: II: Visual Fields are full.  III,IV, VI: EOMI without ptosis or diploplia. Pupils equal, round and reactive to light V: Facial sensation is symmetric to temperature VII: Facial movement is symmetric.  VIII: hearing is intact to voice X: Palat elevates symmetrically XI: Shoulder shrug is symmetric. XII: tongue is midline without atrophy or fasciculations.  Motor: Tone is normal. Bulk is normal. 5/5 strength was present in all four extremities.  Sensory: Sensation is symmetric to light touch and temperature in the arms and legs. Deep Tendon Reflexes: 2+ and symmetric in the biceps and patellae.  Plantars: Toes are downgoing bilaterally.  Cerebellar: FNF and HKS are intact bilaterally  Labs I have reviewed labs in epic and the results pertinent to this consultation are:   CBC    Component Value Date/Time   WBC 6.9 08/27/2018 1431   RBC 5.00 08/27/2018 1431   HGB 14.2 08/27/2018 1431   HCT 44.3 08/27/2018 1431   PLT 194 08/27/2018 1431   MCV 88.6 08/27/2018 1431   MCH 28.4 08/27/2018 1431   MCHC 32.1 08/27/2018 1431   RDW 12.3 08/27/2018 1431   LYMPHSABS 2.6 08/27/2018 1431   MONOABS 0.5 08/27/2018 1431   EOSABS 0.1 08/27/2018 1431   BASOSABS 0.0 08/27/2018 1431    CMP     Component Value Date/Time   NA 138 11/19/2017 2031   K 3.7 11/19/2017 2031   CL 102 11/19/2017 2031   CO2 26 11/19/2017 2018   GLUCOSE 94 11/19/2017 2031   BUN 19 11/19/2017 2031   CREATININE 0.70 08/27/2018 1442   CALCIUM 9.3 11/19/2017 2018   PROT 6.2 (L) 11/19/2017 2018   ALBUMIN 3.8  11/19/2017 2018   AST 35 11/19/2017 2018   ALT 40 11/19/2017 2018   ALKPHOS 77 11/19/2017 2018   BILITOT 0.5 11/19/2017 2018   GFRNONAA >60 11/19/2017 2018   GFRAA >60 11/19/2017 2018    Lipid Panel     Component Value Date/Time   CHOL 134 11/20/2017 0533   TRIG 37 11/20/2017 0533   HDL 55 11/20/2017 0533   CHOLHDL 2.4 11/20/2017 0533   VLDL 7 11/20/2017 0533   LDLCALC 72 11/20/2017 0533     Imaging I have reviewed the images obtained:  CT-scan of the brain-negative exam  MRI examination of the brain-negative  Etta Quill PA-C [facility] [phone] 08/27/2018, 2:59 PM     Assessment: 43 year old female presenting to the  hospital with dizziness and weakness of her left arm.  1. 1. Exam with multiple functional, nonphysiological and distractable aspects, with no definite localizing features 2. CT head did not show any hemorrhage or acute stroke.   3. MRI brain negative 4. MRA head negative.  5. Patient's NIH stroke scale was 2 and symptoms were very mild. Risks of tPA significantly outweigh benefits given lack of clearly localizable hard neurological deficits.   6. Prior diagnosis of hemiplegic migraine.   7. Overall impression is that her presentation is most likely an anxiety-related phenomenon or migraine accompaniment.   Recommendations: - ASA 325 mg daily - Outpatient Neurology follow up.   I have seen and examined the patient. I have formulated the assessment and plan. 43 year old female with history of hemiplegic migraine presenting with non-localizable symptoms and waxing-waning, distractable exam features. Imaging studies negative. Starting on ASA. Will need outpatient Neurology follow up.  Electronically signed: Dr. Kerney Elbe

## 2018-08-27 NOTE — ED Triage Notes (Signed)
Pt arrives via EMS from work where patient began feel dizzy, about to fall, noted to have right facial droop and left arm drift at triage. Code stroke activated, symptoms began 1 hour ago. Dr. Laverta [place] in triage to initiate order set and clear airway.

## 2018-10-07 MED FILL — VYVANSE 70 MG CAPSULE: 70 | 30 days supply | Qty: 30 | Fill #0

## 2018-10-22 ENCOUNTER — Encounter (HOSPITAL_COMMUNITY): Payer: Self-pay | Admitting: Emergency Medicine

## 2018-10-22 ENCOUNTER — Emergency Department (HOSPITAL_COMMUNITY)
Admission: EM | Admit: 2018-10-22 | Discharge: 2018-10-22 | Disposition: A | Discharge status: Home / Self Care | Payer: 59 | Attending: Emergency Medicine | Admitting: Emergency Medicine

## 2018-10-22 ENCOUNTER — Telehealth: Payer: Self-pay | Admitting: Neurology

## 2018-10-22 ENCOUNTER — Other Ambulatory Visit: Payer: Self-pay

## 2018-10-22 DIAGNOSIS — R519 Headache, unspecified: Secondary | ICD-10-CM

## 2018-10-22 DIAGNOSIS — R251 Tremor, unspecified: Secondary | ICD-10-CM | POA: Diagnosis not present

## 2018-10-22 DIAGNOSIS — Z79899 Other long term (current) drug therapy: Secondary | ICD-10-CM | POA: Insufficient documentation

## 2018-10-22 DIAGNOSIS — R531 Weakness: Secondary | ICD-10-CM

## 2018-10-22 DIAGNOSIS — G43409 Hemiplegic migraine, not intractable, without status migrainosus: Secondary | ICD-10-CM | POA: Diagnosis not present

## 2018-10-22 DIAGNOSIS — Z8542 Personal history of malignant neoplasm of other parts of uterus: Secondary | ICD-10-CM | POA: Insufficient documentation

## 2018-10-22 DIAGNOSIS — R51 Headache: Secondary | ICD-10-CM | POA: Insufficient documentation

## 2018-10-22 LAB — CBC
HCT: 44.8 % (ref 36.0–46.0)
Hemoglobin: 14.4 g/dL (ref 12.0–15.0)
MCH: 28.7 pg (ref 26.0–34.0)
MCHC: 32.1 g/dL (ref 30.0–36.0)
MCV: 89.2 fL (ref 80.0–100.0)
Platelets: 188 10*3/uL (ref 150–400)
RBC: 5.02 MIL/uL (ref 3.87–5.11)
RDW: 12.2 % (ref 11.5–15.5)
WBC: 8 10*3/uL (ref 4.0–10.5)
nRBC: 0 % (ref 0.0–0.2)

## 2018-10-22 LAB — URINALYSIS, ROUTINE W REFLEX MICROSCOPIC
Bilirubin Urine: NEGATIVE
Glucose, UA: NEGATIVE mg/dL
Hgb urine dipstick: NEGATIVE
Ketones, ur: NEGATIVE mg/dL
Leukocytes,Ua: NEGATIVE
Nitrite: NEGATIVE
Protein, ur: NEGATIVE mg/dL
Specific Gravity, Urine: 1.016 (ref 1.005–1.030)
pH: 5 (ref 5.0–8.0)

## 2018-10-22 LAB — I-STAT BETA HCG BLOOD, ED (MC, WL, AP ONLY): I-stat hCG, quantitative: <5 m[IU]/mL (ref <5)

## 2018-10-22 LAB — BASIC METABOLIC PANEL
Anion gap: 10 (ref 5–15)
BUN: 18 mg/dL (ref 6–20)
CO2: 26 mmol/L (ref 22–32)
Calcium: 9 mg/dL (ref 8.9–10.3)
Chloride: 104 mmol/L (ref 98–111)
Creatinine, Ser: 0.79 mg/dL (ref 0.44–1.00)
GFR calc Af Amer: >60 mL/min (ref >60)
GFR calc non Af Amer: >60 mL/min (ref >60)
Glucose, Bld: 103 mg/dL — ABNORMAL HIGH (ref 70–99)
Potassium: 3.8 mmol/L (ref 3.5–5.1)
Sodium: 140 mmol/L (ref 135–145)

## 2018-10-22 MED ORDER — SODIUM CHLORIDE 0.9 % IV BOLUS
1000.0000 mL | Freq: Once | INTRAVENOUS | Status: AC
  Administered 2018-10-22: 18:00:00 1000 mL via INTRAVENOUS

## 2018-10-22 MED ORDER — PROCHLORPERAZINE EDISYLATE 10 MG/2ML IJ SOLN
10.0000 mg | Freq: Once | INTRAMUSCULAR | Status: AC
  Administered 2018-10-22: 18:00:00 10 mg via INTRAVENOUS
  Filled 2018-10-22: qty 2

## 2018-10-22 MED ORDER — DEXAMETHASONE SODIUM PHOSPHATE 10 MG/ML IJ SOLN
10.0000 mg | Freq: Once | INTRAMUSCULAR | Status: AC
  Administered 2018-10-22: 18:00:00 10 mg via INTRAVENOUS
  Filled 2018-10-22: qty 1

## 2018-10-22 MED ORDER — ACETAMINOPHEN 325 MG PO TABS
650.0000 mg | ORAL_TABLET | Freq: Once | ORAL | Status: AC
  Administered 2018-10-22: 650 mg via ORAL
  Filled 2018-10-22: qty 2

## 2018-10-22 MED ORDER — SODIUM CHLORIDE 0.9% FLUSH: 3.0000 mL | Freq: Once | INTRAVENOUS | Status: DC

## 2018-10-22 MED ORDER — DIPHENHYDRAMINE HCL 50 MG/ML IJ SOLN
12.5000 mg | Freq: Once | INTRAMUSCULAR | Status: AC
  Administered 2018-10-22: 18:00:00 12.5 mg via INTRAVENOUS
  Filled 2018-10-22: qty 1

## 2018-10-22 MED ORDER — METOCLOPRAMIDE HCL 5 MG/ML IJ SOLN: 10.0000 mg | Freq: Once | INTRAMUSCULAR | Status: DC

## 2018-10-22 NOTE — ED Triage Notes (Signed)
Pt states approx an hour ago pt started having tremors in bilateral arms and legs feel shaky. Pt states she also feels a little dizzy. Pt denies CP/SOB

## 2018-10-22 NOTE — ED Provider Notes (Signed)
[facility] EMERGENCY DEPARTMENT Provider Note   CSN: [id number] Arrival date & time: 10/22/18  1350     History   Chief Complaint Chief Complaint  Patient presents with  . Dizziness  . Tremors    HPI Jennifer Mayo is a 43 y.o. female who presents with CHF, hx of hemiplegic migraines, hx of endometrial cancer s/p hysterectomy, anemia. She states that around 1PM today while at work she started to feel weak in her upper and lower extremities, developed a headache that felt like a heaviness in the back of her head, feels dizzy/lightheaded like she is off balance but not like vertigo or near syncope, has had tremors, and expressive aphasia which seems to be improving. She states that she has a working diagnosis of hemiplegic migraines and this has happened 2 times before but they are never the same. She is followed by Dr. Tomi Likens with Neuro and has had extensive testing. She has had multiple brain MRIs over the past year which have all been negative. She was started on a monthly injection for migraines (Aimovig) - she has had about 2 doses of this. She called her neurologist's office and they recommended her to come to the ED since Dr. Tomi Likens was not in the office. She denies fever, severe headache, LOC, seizure activity, unilateral weakness, chest pain, SOB, numbness, paresthesias.        HPI  Past Medical History:  Diagnosis Date  . Anemia   . Cancer [facility])    endometrial  . Cardiac microvascular disease   . CHF (congestive heart failure) ([place])   . Depression   . History of endometrial cancer    had hysterectomy in August 2015  . Mild diastolic dysfunction   . Narcolepsy   . Obstructive sleep apnea   . Pleural effusion, bilateral    3-4 years   . Solitary pulmonary nodule on lung CT    follow up CT recommended per medical records  . Thyroid disease   . Vitamin B 12 deficiency     Patient Active Problem List   Diagnosis Date Noted  . Hemiplegic migraine  11/21/2017  . Cardiac microvascular disease 11/21/2017  . Depression 11/21/2017  . Stroke-like episode ([place]) s/p IV tPA 11/19/2017  . Adrenal insufficiency ([place]) 10/23/2016  . Atelectasis   . Acute respiratory failure ([place])   . Encounter for central line placement   . Near syncope 10/19/2016  . Hypotension 07/05/2016  . Morbid obesity due to excess calories ([place]) 07/01/2016  . Solitary pulmonary nodule on lung CT   . Narcolepsy   . Vitamin B 12 deficiency   . Mild diastolic dysfunction   . Obstructive sleep apnea     Past Surgical History:  Procedure Laterality Date  . ABDOMINAL HYSTERECTOMY    . CHOLECYSTECTOMY    . 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 UPPER GI ENDOSCOPY  07/01/2016   Procedure: UPPER GI ENDOSCOPY;  Surgeon: Arta Bruce Kinsinger, MD;  Location: [facility];  Service: General;;     OB History   No obstetric history on file.      Home Medications    Prior to Admission medications   Medication Sig Start Date End Date Taking? Authorizing Provider  Armodafinil 250 MG tablet Take 250 mg by mouth daily.  [provider]  atomoxetine (STRATTERA) 40 MG capsule  06/05/18   [provider]  buPROPion (WELLBUTRIN XL) 300 MG 24 hr tablet  05/01/18   [provider]  Calcium Carb-Cholecalciferol (CALCIUM 1000 + D PO) Take 1 tablet by mouth 3 (three) times daily.    [provider]  Erenumab-aooe (AIMOVIG) 70 MG/ML SOAJ Inject 1 Dose into the skin every 28 (twenty-eight) days. 06/26/18   Tomi Likens, Adam R, DO  FLUoxetine (PROZAC) 40 MG capsule Take 40 mg by mouth daily.    [provider]  Levomefolate Glucosamine (METHYLFOLATE) 400 MCG CAPS Take 1 capsule by mouth daily.    [provider]   lisdexamfetamine (VYVANSE) 70 MG capsule Take 70 mg by mouth daily.    [provider]  Multiple Vitamins-Minerals (BARIATRIC MULTIVITAMINS/IRON PO) Take 1 tablet by mouth 2 (two) times daily.    [provider]  promethazine (PHENERGAN) 12.5 MG tablet Take 1 tablet (12.5 mg total) by mouth every 6 (six) hours as needed for nausea or vomiting. 08/27/18   Drenda Freeze, MD    Family History Family History  Problem Relation Age of Onset  . Cancer Mother   . Brain cancer Mother   . Seizures Mother   . Stroke Mother   . Breast cancer Mother 48  . Diabetes Father     Social History Social History   Tobacco Use  . Smoking status: Never Smoker  . Smokeless tobacco: Never Used  Substance Use Topics  . Alcohol use: Yes    Alcohol/week: 0.0 standard drinks    Comment: very seldom  . Drug use: No     Allergies   Ranexa [ranolazine], Nsaids, Ranexa [ranolazine er], Topamax [topiramate], and Verapamil   Review of Systems Review of Systems  Constitutional: Negative for fever.  Eyes: Negative for visual disturbance.  Respiratory: Negative for shortness of breath.   Cardiovascular: Negative for chest pain.  Gastrointestinal: Positive for nausea. Negative for abdominal pain and vomiting.  Genitourinary: Negative for dysuria.  Neurological: Positive for dizziness, tremors, speech difficulty, weakness, light-headedness and headaches. Negative for seizures, syncope and numbness.  All other systems reviewed and are negative.    Physical Exam Updated Vital Signs BP (!) 163/97 (BP Location: Left Arm)   Pulse 88   Temp 98.4 F (36.9 C) (Oral)   Resp 19   Ht 5\' 6"  (1.676 m)   Wt 81.6 kg   SpO2 100%   BMI 29.05 kg/m   Physical Exam Vitals signs and nursing note reviewed.  Constitutional:      General: She is not in acute distress.    Appearance: Normal appearance. She is well-developed. She is not ill-appearing.  HENT:     Head: Normocephalic and  atraumatic.     Mouth/Throat:     Mouth: Mucous membranes are dry.  Eyes:     General: No scleral icterus.       Right eye: No discharge.        Left eye: No discharge.     Conjunctiva/sclera: Conjunctivae normal.     Pupils: Pupils are equal, round, and reactive to light.  Neck:     Musculoskeletal: Normal range of motion.  Cardiovascular:     Rate and Rhythm: Normal rate.  Pulmonary:     Effort: Pulmonary effort is normal. No respiratory distress.  Abdominal:     General: There is no distension.  Skin:    General: Skin is warm and dry.  Neurological:  Mental Status: She is alert and oriented to person, place, and time.     Comments: Mental Status:  Alert, oriented, thought content appropriate, able to give a coherent history. Speech is somewhat halting but there is no dysarthria or aphasia. Able to follow 2 step commands without difficulty.  Cranial Nerves:  II:  Peripheral visual fields grossly normal, pupils equal, round, reactive to light. Wearing contacts III,IV, VI: ptosis not present, extra-ocular motions intact bilaterally although she has some difficulty following V,VII: smile symmetric, facial light touch sensation equal VIII: hearing grossly normal to voice  X: uvula elevates symmetrically  XI: bilateral shoulder shrug symmetric and strong XII: midline tongue extension without fassiculations Motor:  Normal tone. 5/5 in upper and lower extremities bilaterally including strong and equal grip strength and dorsiflexion/plantar flexion Sensory: Pinprick and light touch normal in all extremities.  Cerebellar: normal finger-to-nose with bilateral upper extremities Gait: Very off balance. Unable to walk with steady gait CV: distal pulses palpable throughout    Psychiatric:        Behavior: Behavior normal.      ED Treatments / Results  Labs (all labs ordered are listed, but only abnormal results are displayed) Labs Reviewed  BASIC METABOLIC PANEL - Abnormal;  Notable for the following components:      Result Value   Glucose, Bld 103 (*)    All other components within normal limits  URINALYSIS, ROUTINE W REFLEX MICROSCOPIC - Abnormal; Notable for the following components:   APPearance HAZY (*)    All other components within normal limits  CBC  I-STAT BETA HCG BLOOD, ED ([facility], [facility], [facility] ONLY)    EKG EKG Interpretation  Date/Time:  Thursday October 22 2018 13:56:23 EDT Ventricular Rate:  92 PR Interval:  138 QRS Duration: 100 QT Interval:  398 QTC Calculation: 492 R Axis:   53 Text Interpretation:  Normal sinus rhythm Septal infarct , age undetermined Abnormal ECG No significant change since last tracing Confirmed by Theotis Burrow [phone]) on 10/22/2018 4:32:48 PM   Radiology No results found.  Procedures Procedures (including critical care time)  Medications Ordered in ED Medications  sodium chloride flush (NS) 0.9 % injection 3 mL (has no administration in time range)  acetaminophen (TYLENOL) tablet 650 mg (650 mg Oral Given 10/22/18 1809)  sodium chloride 0.9 % bolus 1,000 mL (0 mLs Intravenous Stopped 10/22/18 2044)  diphenhydrAMINE (BENADRYL) injection 12.5 mg (12.5 mg Intravenous Given 10/22/18 1810)  dexamethasone (DECADRON) injection 10 mg (10 mg Intravenous Given 10/22/18 1809)  prochlorperazine (COMPAZINE) injection 10 mg (10 mg Intravenous Given 10/22/18 1809)     Initial Impression / Assessment and Plan / ED Course  I have reviewed the triage vital signs and the nursing notes.  Pertinent labs & imaging results that were available during my care of the patient were reviewed by me and considered in my medical decision making (see chart for details).  43 year old female presents with generalized weakness, tremors, head pressure and expressive aphasia.  She is hypertensive but otherwise vital signs are normal.  Her neurologic exam is abnormal since she cannot walk however she has no focal weakness. Labs are unremarkable. Will try and  discuss with her neurologist.   Dr. Tomi Likens is not in the office. I spoke with his RN, Lovey Newcomer who is not sure of what test Dr. Tomi Likens wanted. She will try and get pt added to the schedule next week.   Discussed with Dr. Cheral Marker. He recommends treating symptoms with headache cocktail and not  repeating MRI at this time. If she does not improve, then can consider MRI.  After headache cocktail she is able to ambulate. She still feels weak all over but is better. Will have her f/u with neurology.  Final Clinical Impressions(s) / ED Diagnoses   Final diagnoses:  Acute nonintractable headache, unspecified headache type  Generalized weakness  Tremor    ED Discharge Orders    None       Recardo Evangelist, PA-C 10/22/18 2349    Little, Wenda Overland, MD 10/23/18 1655

## 2018-10-22 NOTE — ED Notes (Signed)
Pt ambulated into hall with no issue

## 2018-10-22 NOTE — Telephone Encounter (Signed)
OK.  Please add her to the schedule next week.  Thank you.

## 2018-10-22 NOTE — Discharge Instructions (Signed)
Please follow up with your neurologist next week Return if worsening

## 2018-10-22 NOTE — Telephone Encounter (Signed)
I rcvd call from [place], a PA at [facility]. Pt was there being seen and was adamant there was another test we wanted her to have while she was having an episode. Claiborne Billings was unable to find any mention of any such test in any notes, and asked if I could locate any other information, I could not. I did advise her Dr. Tomi Likens said to add her to the schedule next week. Claiborne Billings will have the Pt call on Monday morning.

## 2018-10-22 NOTE — Telephone Encounter (Signed)
Left message with after hour service on 10-22-18@ 1:16 pm   Caller states wife is having some additional sx and would like to know if he can move up her appt to today. Caller states she has arm weakness and difficulty speaking and shaking hands. Caller states they don't believe it is a stroke and it maybe a hemiplegic migraine caller also thinks maybe having a seizure. Caller would like to know if they could go into the office today for a last minute appt.    Patient was told to go to the ED   Gave a paper copy of the message to [facility]

## 2018-10-22 NOTE — ED Notes (Signed)
Discharge instructions discussed with Pt. Pt verbalized understanding. Pt stable and ambulatory.    

## 2018-10-29 NOTE — Progress Notes (Signed)
NEUROLOGY FOLLOW UP OFFICE NOTE  Jennifer Mayo [id number]  HISTORY OF PRESENT ILLNESS: Jennifer Mayo is a 43 year old right-handed Caucasian woman with CHF, microvascular coronary artery disease, obstructive sleep apnea, narcolepsy, status post bariatric surgery and history of endometrial cancer who follows up for complicated migraines/hemiplegic migraines.  UPDATE: Current medications  Wellbutrin XL, Strattera, Prozac, Vyvanse Due to mild residual symptoms, she had a repeat MRI of brain with and without contrast on 08/07/18 which was personally reviewed and was normal.  She was started on Aimovig 70mg .  She presented to the [facility] ED on 08/27/18 as a stroke code for sudden onset dizziness, headache and now LEFT sided weakness.  MRI and MRA of head personally reviewed were negative.  Symptoms lasted into next day  She returned to [facility] ED on 10/22/18 for sudden onset onset of weakness of all of her extremities, but primarily left sided, dizziness/lightheadedness (not spinning), tremors and difficulty speaking.  She also had a mild headache/heaviness in the back of her head. Symptoms improved while in ED and resolved by next day.  She did not take any Ubrelvy for above episodes because she didn't have it with her.    She had two mild episodes, possibly aura, in which she had trouble speaking and right hand heaviness.  She took [place] and it never progressed, lasting 20-30 minutes.   No headaches.   Current NSAIDS:  none Current analgesics:  none Current triptans:  none Current ergotamine:  none Current anti-emetic:  promethazine Current muscle relaxants:  none Current anti-anxiolytic:  none Current sleep aide:  none Current Antihypertensive medications:  none Current Antidepressant medications:  Prozac, Wellbutrin XL, Vyvanse Current Anticonvulsant medications:  none Current anti-CGRP:  Aimovig 70mg  Current Vitamins/Herbal/Supplements:  none Current Antihistamines/Decongestants:   none Other therapy:  none Other medication:  Straterra  Notes increased stress.  Working 90 hours a week from 40 hours a week.  More tired.    HISTORY: The patient was admitted to [facility] on 11/19/17 after presenting with right sided weakness involving the face, hand and leg.  NIH stroke scale was 3 due to mild right sided weakness of face, upper and lower extremities.  She received tPA.  There was no associated headache, unilateral numbness, aphasia, or visual disturbance.  MRI of brain personally reviewed and negative for acute stroke.  CTA of head and neck personally reviewed and demonstrated no emergent or significant large vessel occlusion or stenosis.  2D echo showed EF 55-60% with no cardiac source of embolus,  Due to recent prolonged car ride, she had lower extremity venous doppler performed, which was negative for DVT.  LDL was 72 and Hgb A1c 5.2.  HIV testing was negative.  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  Since then, she has a mild residual right sided heaviness, unsteadiness and memory and word-finding issues.  Since hospitalization, she has had a constant left sided pressure-like to sharp headache that fluctuates in intensity from mild to severe.  It becomes severe 5-10 minutes about 3 times a day.  No associated symptoms such as nausea, photophobia, phonophobia, visual disturbance or worsening numbness or weakness.  She was subsequently taken off of ASA due to consideration that the event was migraine. MRI of brain with and without contrast was performed as a follow up which was personally reviewed and was normal.   Around January 2020, she developed mild left sided weakness of arm and leg for 15  minutes.  On her own, she restarted ASA every other day.  Yesterday, she was at work and her right arm and hand felt weak with difficulty writing, lasting 60-90 minutes.  She denies prior history of migraines.  In hindsight,  she had an event in 1999 in which she developed a visual aura described as a crayon scribble that slowly grows to cover visual field of both eyes, lasting 30 minutes.  It occurred a couple of years later, lasting 45 minutes.  Both events were not associated with headache.  She was diagnosed with microvascular cardiac disease.  Past medications include:  Amitriptyline (side effects), topiramate (side effects), beta blockers (hypotension)  PAST MEDICAL HISTORY: Past Medical History:  Diagnosis Date  . Anemia   . Cancer [facility])    endometrial  . Cardiac microvascular disease   . CHF (congestive heart failure) ([place])   . Depression   . History of endometrial cancer    had hysterectomy in August 2015  . Mild diastolic dysfunction   . Narcolepsy   . Obstructive sleep apnea   . Pleural effusion, bilateral    3-4 years   . Solitary pulmonary nodule on lung CT    follow up CT recommended per medical records  . Thyroid disease   . Vitamin B 12 deficiency     MEDICATIONS: Current Outpatient Medications on File Prior to Visit  Medication Sig Dispense Refill  . Armodafinil 250 MG tablet Take 250 mg by mouth daily.    Marland Kitchen atomoxetine (STRATTERA) 40 MG capsule     . buPROPion (WELLBUTRIN XL) 300 MG 24 hr tablet     . Calcium Carb-Cholecalciferol (CALCIUM 1000 + D PO) Take 1 tablet by mouth 3 (three) times daily.    Eduard Roux (AIMOVIG) 70 MG/ML SOAJ Inject 1 Dose into the skin every 28 (twenty-eight) days. 1 pen 3  . FLUoxetine (PROZAC) 40 MG capsule Take 40 mg by mouth daily.    . Levomefolate Glucosamine (METHYLFOLATE) 400 MCG CAPS Take 1 capsule by mouth daily.    Marland Kitchen lisdexamfetamine (VYVANSE) 70 MG capsule Take 70 mg by mouth daily.    . Multiple Vitamins-Minerals (BARIATRIC MULTIVITAMINS/IRON PO) Take 1 tablet by mouth 2 (two) times daily.    . promethazine (PHENERGAN) 12.5 MG tablet Take 1 tablet (12.5 mg total) by mouth every 6 (six) hours as needed for nausea or vomiting. 10 tablet 0    No current facility-administered medications on file prior to visit.     ALLERGIES: Allergies  Allergen Reactions  . Ranexa [Ranolazine] Shortness Of Breath  . Nsaids     Gastric bypass - ulcer risk  . Ranexa [Ranolazine Er]   . Topamax [Topiramate]   . Verapamil Other (See Comments)    hypotension    FAMILY HISTORY: Family History  Problem Relation Age of Onset  . Cancer Mother   . Brain cancer Mother   . Seizures Mother   . Stroke Mother   . Breast cancer Mother 24  . Diabetes Father    SOCIAL HISTORY: Social History   Socioeconomic History  . Marital status: Married    Spouse name: Not on file  . Number of children: Not on file  . Years of education: Not on file  . Highest education level: Not on file  Occupational History  . Not on file  Social Needs  . Financial resource strain: Not on file  . Food insecurity    Worry: Not on file    Inability: Not on  file  . Transportation needs    Medical: Not on file    Non-medical: Not on file  Tobacco Use  . Smoking status: Never Smoker  . Smokeless tobacco: Never Used  Substance and Sexual Activity  . Alcohol use: Yes    Alcohol/week: 0.0 standard drinks    Comment: very seldom  . Drug use: No  . Sexual activity: Not on file  Lifestyle  . Physical activity    Days per week: Not on file    Minutes per session: Not on file  . Stress: Not on file  Relationships  . Social Herbalist on phone: Not on file    Gets together: Not on file    Attends religious service: Not on file    Active member of club or organization: Not on file    Attends meetings of clubs or organizations: Not on file    Relationship status: Not on file  . Intimate partner violence    Fear of current or ex partner: Not on file    Emotionally abused: Not on file    Physically abused: Not on file    Forced sexual activity: Not on file  Other Topics Concern  . Not on file  Social History Narrative   Pt is R handed   Lives  in 3 story home with her husband and 2 (sometimes 3) children   Has 3 children   Masters Degree x2 Theatre stage manager, NP   Practicing Nurse Practitioner     REVIEW OF SYSTEMS: Constitutional: No fevers, chills, or sweats, no generalized fatigue, change in appetite Eyes: No visual changes, double vision, eye pain Ear, nose and throat: No hearing loss, ear pain, nasal congestion, sore throat Cardiovascular: No chest pain, palpitations Respiratory:  No shortness of breath at rest or with exertion, wheezes GastrointestinaI: No nausea, vomiting, diarrhea, abdominal pain, fecal incontinence Genitourinary:  No dysuria, urinary retention or frequency Musculoskeletal:  No neck pain, back pain Integumentary: No rash, pruritus, skin lesions Neurological: as above Psychiatric: No depression, insomnia, anxiety Endocrine: No palpitations, fatigue, diaphoresis, mood swings, change in appetite, change in weight, increased thirst Hematologic/Lymphatic:  No purpura, petechiae. Allergic/Immunologic: no itchy/runny eyes, nasal congestion, recent allergic reactions, rashes  PHYSICAL EXAM: Blood pressure 137/81, pulse (!) 102, height 5\' 6"  (1.676 m), weight 182 lb (82.6 kg), SpO2 100 %. General: No acute distress.  Patient appears well-groomed.   Head:  Normocephalic/atraumatic Eyes:  Fundi examined but not visualized Neck: supple, no paraspinal tenderness, full range of motion Heart:  Regular rate and rhythm Lungs:  Clear to auscultation bilaterally Back: No paraspinal tenderness Neurological Exam: alert and oriented to person, place, and time. Attention span and concentration intact, recent and remote memory intact, fund of knowledge intact.  Speech fluent and not dysarthric, language intact.  CN II-XII intact. Bulk and tone normal, muscle strength 5/5 throughout.  Sensation to light touch  intact.  Deep tendon reflexes 2+ throughout.  Finger to nose testing intact.  Gait normal, Romberg negative.   IMPRESSION: Hemiplegic migraine.  She responds to [place] which aborts symptoms before they progress, supporting diagnosis of migraine.  Recurrent MRI negative for stroke or demyelinating disease.  CTA and MRA during attacks negative for vasospasm.   Low suspicion, but we will check EEG for potential epileptiform activity.  PLAN: 1.  Increase Aimovig to 140mg  monthly 2.  Take Ubrelvy at earliest onset of symptoms.  Advised to keep on hand at all times. 3.  Since  she has had recurrent spells, advised to start ASA 81mg  daily 4.  Check EEG 5.  Follow up in 4 months.  Metta Clines, DO  CC: Ferd Hibbs, NP

## 2018-10-30 ENCOUNTER — Other Ambulatory Visit: Payer: Self-pay

## 2018-10-30 ENCOUNTER — Encounter: Payer: Self-pay | Admitting: Neurology

## 2018-10-30 ENCOUNTER — Ambulatory Visit (INDEPENDENT_AMBULATORY_CARE_PROVIDER_SITE_OTHER): Payer: 59 | Admitting: Neurology

## 2018-10-30 VITALS — BP 137/81 | HR 102 | Ht 66.0 in | Wt 182.0 lb

## 2018-10-30 DIAGNOSIS — G43409 Hemiplegic migraine, not intractable, without status migrainosus: Secondary | ICD-10-CM | POA: Diagnosis not present

## 2018-10-30 MED ORDER — AIMOVIG 140 MG/ML ~~LOC~~ SOAJ: 140.0000 mg | SUBCUTANEOUS | 4 refills | Status: DC

## 2018-10-30 NOTE — Addendum Note (Signed)
Addended by: Clois Comber on: 10/30/2018 10:43 AM   Modules accepted: Orders

## 2018-10-30 NOTE — Patient Instructions (Addendum)
1.  Will increase Aimovig to 140mg  monthly 2.  Keep Jennifer Mayo on you at all times and take at earliest onset of symptoms 3.  Keep diary documenting spells 4.  Will check EEG 5.  Start aspirin 81mg  daily 6.  Follow up in 4 months

## 2018-11-10 MED FILL — VYVANSE 70 MG CAPSULE: 70 | 30 days supply | Qty: 30 | Fill #0

## 2018-11-16 ENCOUNTER — Other Ambulatory Visit: Payer: 59

## 2018-11-20 DIAGNOSIS — Z0000 Encounter for general adult medical examination without abnormal findings: Secondary | ICD-10-CM | POA: Diagnosis not present

## 2018-12-12 MED FILL — MODAFINIL 200 MG TABLET: 200 | 30 days supply | Qty: 60 | Fill #1

## 2018-12-12 MED FILL — VYVANSE 70 MG CAPSULE: 70 | 30 days supply | Qty: 30 | Fill #0

## 2018-12-18 ENCOUNTER — Encounter: Payer: Self-pay | Admitting: *Deleted

## 2018-12-18 ENCOUNTER — Telehealth: Payer: Self-pay | Admitting: *Deleted

## 2018-12-18 NOTE — Telephone Encounter (Signed)
LMOM to call back . To finish PA for Aimovig need to know if decreased HA, severity, etc.

## 2018-12-18 NOTE — Progress Notes (Signed)
St this point questions need answered so I called and LMOM Zacaria Deguzman (Key: [id number]) Aimovig 70MG /ML auto-injectors   Form [employer] ePA Form Created 4 days ago Sent to Plan less than a minute ago Plan Response less than a minute ago Submit Clinical Questions Expires 10 days from now Determination Clinical Questions Are Ready Fill out the questions below and click "Send to Plan." The plan requires answers to the clinical questions for this electronic prior authorization.

## 2019-01-12 MED FILL — VYVANSE 70 MG CAPSULE: 70 | 30 days supply | Qty: 30 | Fill #0

## 2019-01-13 ENCOUNTER — Telehealth: Payer: Self-pay | Admitting: Neurology

## 2019-01-21 ENCOUNTER — Telehealth: Payer: Self-pay | Admitting: *Deleted

## 2019-01-21 NOTE — Telephone Encounter (Signed)
LMOM to call back and ask for Laureen Ochs there are 2 Susan's in this office. Asked her to call about medication management regarding prior authorization and about scheduling her EEG before her next follow up with Dr. Tomi Likens

## 2019-02-12 MED FILL — VYVANSE 70 MG CAPSULE: 70 | 30 days supply | Qty: 30 | Fill #0

## 2019-02-20 ENCOUNTER — Telehealth: Payer: 59 | Admitting: Nurse Practitioner

## 2019-02-20 DIAGNOSIS — R059 Cough, unspecified: Secondary | ICD-10-CM

## 2019-02-20 DIAGNOSIS — Z20828 Contact with and (suspected) exposure to other viral communicable diseases: Secondary | ICD-10-CM

## 2019-02-20 DIAGNOSIS — Z20822 Contact with and (suspected) exposure to covid-19: Secondary | ICD-10-CM

## 2019-02-20 DIAGNOSIS — R05 Cough: Secondary | ICD-10-CM

## 2019-02-20 MED ORDER — BENZONATATE 100 MG PO CAPS: 100.0000 mg | ORAL_CAPSULE | Freq: Three times a day (TID) | ORAL | 0 refills | Status: DC | PRN

## 2019-02-20 MED FILL — BENZONATATE 100 MG CAPS: 100 | 7 days supply | Qty: 20 | Fill #0

## 2019-02-20 NOTE — Progress Notes (Signed)
E-Visit for Corona Virus Screening   Your current symptoms could be consistent with the coronavirus.  Many health care providers can now test patients at their office but not all are.  [facility] has multiple testing sites. For information on our COVID testing locations and hours go to [email or web address]  Please quarantine yourself while awaiting your test results.  We are enrolling you in our [place] for [place] . Daily you will receive a questionnaire within the [facility] website. Our COVID 19 response team willl be monitoriing your responses daily.  You can go to one of the  testing sites listed below, while they are opened (see hours). You do not need a doctors order to be tested for covid.You do need to self-isolate until your results return and if positive 14 days from when your symptoms started and until you are 3 days symptom free.   Testing Locations (Monday - Friday, 8 a.m. - 3:30 p.m.)   [place]: [facility] Entrance), [address], [place], [place]: [address], [place], [place], [place] (entrance off [address] (Closed each Monday): [address], [place], [place] - the short stay covered drive at [facility] (Use the [employer] entrance to [facility] next to [place] is a respiratory illness with symptoms that are similar to the flu. Symptoms are typically mild to moderate, but there have been cases of severe illness and death due to the virus. The following symptoms may appear 2-14 days after exposure: . Fever . Cough . Shortness of breath or difficulty breathing . Chills . Repeated shaking with chills . Muscle pain . Headache . Sore throat . New loss of taste or smell . Fatigue . Congestion or runny nose . Nausea or vomiting . Diarrhea  It is vitally  important that if you feel that you have an infection such as this virus or any other virus that you stay home and away from places where you may spread it to others.  You should self-quarantine for 14 days if you have symptoms that could potentially be coronavirus or have been in close contact a with a person diagnosed with COVID-19 within the last 2 weeks. You should avoid contact with people age 85 and older.   You should wear a mask or cloth face covering over your nose and mouth if you must be around other people or animals, including pets (even at home). Try to stay at least 6 feet away from other people. This will protect the people around you.  You can use medication such as A prescription cough medication called Tessalon Perles 100 mg. You may take 1-2 capsules every 8 hours as needed for cough  You may also take acetaminophen (Tylenol) as needed for fever.   Reduce your risk of any infection by using the same precautions used for avoiding the common cold or flu:  Marland Kitchen Wash your hands often with soap and warm water for at least 20 seconds.  If soap and water are not readily available, use an alcohol-based hand sanitizer with at least 60% alcohol.  . If coughing or sneezing, cover your mouth and nose by coughing or sneezing into the elbow areas of your shirt or coat, into a tissue or into your sleeve (not your hands). . Avoid shaking hands with others and consider head nods or  verbal greetings only. . Avoid touching your eyes, nose, or mouth with unwashed hands.  . Avoid close contact with people who are sick. . Avoid places or events with large numbers of people in one location, like concerts or sporting events. . Carefully consider travel plans you have or are making. . If you are planning any travel outside or inside the [place], visit the CDC's Travelers' Health webpage for the latest health notices. . If you have some symptoms but not all symptoms, continue to monitor at home and seek medical  attention if your symptoms worsen. . If you are having a medical emergency, call 911.  HOME CARE . Only take medications as instructed by your medical team. . Drink plenty of fluids and get plenty of rest. . A steam or ultrasonic humidifier can help if you have congestion.   GET HELP RIGHT AWAY IF YOU HAVE EMERGENCY WARNING SIGNS** FOR COVID-19. If you or someone is showing any of these signs seek emergency medical care immediately. Call 911 or proceed to your closest emergency facility if: . You develop worsening high fever. . Trouble breathing . Bluish lips or face . Persistent pain or pressure in the chest . New confusion . Inability to wake or stay awake . You cough up blood. . Your symptoms become more severe  **This list is not all possible symptoms. Contact your medical provider for any symptoms that are sever or concerning to you.   MAKE SURE YOU   Understand these instructions.  Will watch your condition.  Will get help right away if you are not doing well or get worse.  Your e-visit answers were reviewed by a board certified advanced clinical practitioner to complete your personal care plan.  Depending on the condition, your plan could have included both over the counter or prescription medications.  If there is a problem please reply once you have received a response from your provider.  Your safety is important to [place].  If you have drug allergies check your prescription carefully.    You can use MyChart to ask questions about today's visit, request a non-urgent call back, or ask for a work or school excuse for 24 hours related to this e-Visit. If it has been greater than 24 hours you will need to follow up with your provider, or enter a new e-Visit to address those concerns. You will get an e-mail in the next two days asking about your experience.  I hope that your e-visit has been valuable and will speed your recovery. Thank you for using e-visits.   5-10 minutes  spent reviewing and documenting in chart.

## 2019-02-22 ENCOUNTER — Other Ambulatory Visit: Payer: Self-pay

## 2019-02-22 DIAGNOSIS — Z20822 Contact with and (suspected) exposure to covid-19: Secondary | ICD-10-CM

## 2019-02-23 LAB — NOVEL CORONAVIRUS, NAA: SARS-CoV-2, NAA: NOT DETECTED

## 2019-03-03 NOTE — Progress Notes (Signed)
NEUROLOGY FOLLOW UP OFFICE NOTE  Jennifer Mayo [id number]  HISTORY OF PRESENT ILLNESS: Jennifer Mayo is a 27 year oldright-handed Caucasian woman with CHF, microvascular coronary artery disease, obstructive sleep apnea, narcolepsy, status post bariatric surgeryand history of endometrial cancer who follows up for complicated migraines/hemiplegic migraines.  UPDATE: EEG was not performed but was unable to keep the appointment.  She has seen improvement since increasing Aimovig.  No headaches.  She has had daily mild-moderate auras (unilateral weakness and feeling off balance usually lasting 20 to 30 minutes with [name]) only during the last week prior to next injection.     Current NSAIDS:  none Current analgesics:  none Current triptans:  none Current ergotamine:  none Current anti-emetic:  promethazine Current muscle relaxants:  none Current anti-anxiolytic:  none Current sleep aide:  none Current Antihypertensive medications:  none Current Antidepressant medications:  Prozac, Wellbutrin XL, Vyvanse Current Anticonvulsant medications:  none Current anti-CGRP:  Aimovig 140mg ; Ubrelvy 100mg  Current Vitamins/Herbal/Supplements:  none Current Antihistamines/Decongestants:  none Other therapy:  none Other medication:  Straterra  Notes increased stress.  Working 90 hours a week from 40 hours a week.  More tired.    HISTORY: The patient was admitted to [facility] on 11/19/17 after presenting with right sided weakness involving the face, hand and leg. NIH stroke scale was 3 due to mild right sided weakness of face, upper and lower extremities. She received tPA. There was no associated headache, unilateral numbness, aphasia, or visual disturbance. MRI of brain personally reviewed and negative for acute stroke. CTA of head and neck personally reviewed and demonstrated no emergent or significant large vessel occlusion or stenosis. 2D echo showed EF 55-60% with no cardiac source of  embolus, Due to recent prolonged car ride, she had lower extremity venous doppler performed, which was negative for DVT. LDL was 72 and Hgb A1c 5.2. HIV testing was negative. 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 Since then, she has a mild residual right sided heaviness, unsteadiness and memory and word-finding issues. Since hospitalization, she has had a constant left sided pressure-like to sharp headache that fluctuates in intensity from mild to severe. It becomes severe 5-10 minutes about 3 times a day. No associated symptoms such as nausea, photophobia, phonophobia, visual disturbance or worsening numbness or weakness. She was subsequently taken off of ASA due to consideration that the event was migraine. MRI of brain with and without contrast was performed as a follow up which was personally reviewed and was normal.   Around January 2020, she developed mild left sided weakness of arm and leg for 15 minutes. On her own, she restarted ASA every other day. Due to mild residual symptoms, she had a repeat MRI of brain with and without contrast on 08/07/18 which was personally reviewed and was normal.   She presented to the [facility] ED on 08/27/18 as a stroke code for sudden onset dizziness, headache and now LEFT sided weakness.  MRI and MRA of head personally reviewed were negative.  Symptoms lasted into next day  She returned to the [facility] ED on 10/22/18 for sudden onset onset of weakness of all of her extremities, but primarily left sided, dizziness/lightheadedness (not spinning), tremors and difficulty speaking.  She also had a mild headache/heaviness in the back of her head. Symptoms improved while in ED and resolved by next day.  Other spells: -  her right arm and hand felt weak with difficulty writing, lasting 60-90  minutes -  Episodes of possible aura, in which she had trouble speaking and right hand heaviness.  She denies prior  history of migraines. In hindsight, she had an event in 1999 in which she developed a visual aura described as a crayon scribble that slowly grows to cover visual field of both eyes, lasting 30 minutes. It occurred a couple of years later, lasting 45 minutes. Both events were not associated with headache.  She was diagnosed with microvascular cardiac disease.  Past medications include: Amitriptyline (side effects), topiramate (side effects), beta blockers (hypotension)  PAST MEDICAL HISTORY: Past Medical History:  Diagnosis Date  . Anemia   . Cancer [facility])    endometrial  . Cardiac microvascular disease   . CHF (congestive heart failure) ([place])   . Depression   . History of endometrial cancer    had hysterectomy in August 2015  . Mild diastolic dysfunction   . Narcolepsy   . Obstructive sleep apnea   . Pleural effusion, bilateral    3-4 years   . Solitary pulmonary nodule on lung CT    follow up CT recommended per medical records  . Thyroid disease   . Vitamin B 12 deficiency     MEDICATIONS: Current Outpatient Medications on File Prior to Visit  Medication Sig Dispense Refill  . Armodafinil 250 MG tablet Take 250 mg by mouth daily.    Marland Kitchen atomoxetine (STRATTERA) 40 MG capsule     . benzonatate (TESSALON PERLES) 100 MG capsule Take 1 capsule (100 mg total) by mouth 3 (three) times daily as needed. 20 capsule 0  . buPROPion (WELLBUTRIN XL) 300 MG 24 hr tablet     . Calcium Carb-Cholecalciferol (CALCIUM 1000 + D PO) Take 1 tablet by mouth 3 (three) times daily.    Eduard Roux (AIMOVIG) 140 MG/ML SOAJ Inject 140 mg into the skin every 30 (thirty) days. 1 pen 4  . FLUoxetine (PROZAC) 40 MG capsule Take 40 mg by mouth daily.    . Levomefolate Glucosamine (METHYLFOLATE) 400 MCG CAPS Take 1 capsule by mouth daily.    Marland Kitchen lisdexamfetamine (VYVANSE) 70 MG capsule Take 70 mg by mouth daily.    . Multiple Vitamins-Minerals (BARIATRIC MULTIVITAMINS/IRON PO) Take 1 tablet by mouth 2  (two) times daily.    . promethazine (PHENERGAN) 12.5 MG tablet Take 1 tablet (12.5 mg total) by mouth every 6 (six) hours as needed for nausea or vomiting. 10 tablet 0   No current facility-administered medications on file prior to visit.     ALLERGIES: Allergies  Allergen Reactions  . Ranexa [Ranolazine] Shortness Of Breath  . Nsaids     Gastric bypass - ulcer risk  . Ranexa [Ranolazine Er]   . Topamax [Topiramate]   . Verapamil Other (See Comments)    hypotension    FAMILY HISTORY: Family History  Problem Relation Age of Onset  . Cancer Mother   . Brain cancer Mother   . Seizures Mother   . Stroke Mother   . Breast cancer Mother 68  . Diabetes Father    SOCIAL HISTORY: Social History   Socioeconomic History  . Marital status: Married    Spouse name: Not on file  . Number of children: Not on file  . Years of education: Not on file  . Highest education level: Not on file  Occupational History  . Not on file  Social Needs  . Financial resource strain: Not on file  . Food insecurity    Worry: Not on  file    Inability: Not on file  . Transportation needs    Medical: Not on file    Non-medical: Not on file  Tobacco Use  . Smoking status: Never Smoker  . Smokeless tobacco: Never Used  Substance and Sexual Activity  . Alcohol use: Yes    Alcohol/week: 0.0 standard drinks    Comment: very seldom  . Drug use: No  . Sexual activity: Not on file  Lifestyle  . Physical activity    Days per week: Not on file    Minutes per session: Not on file  . Stress: Not on file  Relationships  . Social Herbalist on phone: Not on file    Gets together: Not on file    Attends religious service: Not on file    Active member of club or organization: Not on file    Attends meetings of clubs or organizations: Not on file    Relationship status: Not on file  . Intimate partner violence    Fear of current or ex partner: Not on file    Emotionally abused: Not on file     Physically abused: Not on file    Forced sexual activity: Not on file  Other Topics Concern  . Not on file  Social History Narrative   Pt is R handed   Lives in 3 story home with her husband and 2 (sometimes 3) children   Has 3 children   Masters Degree x2 Theatre stage manager, NP   Practicing Nurse Practitioner     REVIEW OF SYSTEMS: Constitutional: No fevers, chills, or sweats, no generalized fatigue, change in appetite Eyes: No visual changes, double vision, eye pain Ear, nose and throat: No hearing loss, ear pain, nasal congestion, sore throat Cardiovascular: No chest pain, palpitations Respiratory:  No shortness of breath at rest or with exertion, wheezes GastrointestinaI: No nausea, vomiting, diarrhea, abdominal pain, fecal incontinence Genitourinary:  No dysuria, urinary retention or frequency Musculoskeletal:  No neck pain, back pain Integumentary: No rash, pruritus, skin lesions Neurological: as above Psychiatric: No depression, insomnia, anxiety Endocrine: No palpitations, fatigue, diaphoresis, mood swings, change in appetite, change in weight, increased thirst Hematologic/Lymphatic:  No purpura, petechiae. Allergic/Immunologic: no itchy/runny eyes, nasal congestion, recent allergic reactions, rashes  PHYSICAL EXAM: Blood pressure 135/87, pulse 84, height 5\' 6"  (1.676 m), weight 194 lb 3.2 oz (88.1 kg), SpO2 100 %. General: No acute distress.  Patient appears well-groomed.   Head:  Normocephalic/atraumatic Eyes:  Fundi examined but not visualized Neck: supple, no paraspinal tenderness, full range of motion Heart:  Regular rate and rhythm Lungs:  Clear to auscultation bilaterally Back: No paraspinal tenderness Neurological Exam: alert and oriented to person, place, and time. Attention span and concentration intact, recent and remote memory intact, fund of knowledge intact.  Speech fluent and not dysarthric, language intact.  CN II-XII intact. Bulk and tone  normal, muscle strength 5/5 throughout.  Sensation to light touch, temperature and vibration intact.  Deep tendon reflexes 2+ throughout, toes downgoing.  Finger to nose and heel to shin testing intact.  Gait normal, Romberg negative.  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  Low suspicion for seizures, so we don't need to pursue EEG.  PLAN: 1.  For preventative management, refilled Aimovig 140mg .  Advised that she may take it every 28 days 2.  For abortive therapy, Ubrelvy 100mg  (refilled) 3.  Limit use of pain relievers to no more than  2 days out of week to prevent risk of rebound or medication-overuse headache. 4.  Keep headache diary 5.  Exercise, hydration, caffeine cessation, sleep hygiene, monitor for and avoid triggers 6.  Consider:  magnesium citrate 400mg  daily, riboflavin 400mg  daily, and coenzyme Q10 100mg  three times daily 7. Always keep in mind that currently taking a hormone or birth control may be a possible trigger or aggravating factor for migraine. 8. Follow up 6 months   Metta Clines, DO  CC:  Ferd Hibbs, NP

## 2019-03-05 ENCOUNTER — Ambulatory Visit: Payer: 59 | Admitting: Neurology

## 2019-03-05 ENCOUNTER — Other Ambulatory Visit: Payer: Self-pay

## 2019-03-05 ENCOUNTER — Encounter: Payer: Self-pay | Admitting: Neurology

## 2019-03-05 VITALS — BP 135/87 | HR 84 | Ht 66.0 in | Wt 194.2 lb

## 2019-03-05 DIAGNOSIS — G43409 Hemiplegic migraine, not intractable, without status migrainosus: Secondary | ICD-10-CM | POA: Diagnosis not present

## 2019-03-05 DIAGNOSIS — G43109 Migraine with aura, not intractable, without status migrainosus: Secondary | ICD-10-CM

## 2019-03-05 MED ORDER — AIMOVIG 140 MG/ML ~~LOC~~ SOAJ: 140.0000 mg | SUBCUTANEOUS | 11 refills | Status: DC

## 2019-03-05 MED ORDER — UBRELVY 100 MG PO TABS: 1.0000 | ORAL_TABLET | ORAL | 5 refills | Status: DC | PRN

## 2019-03-05 MED FILL — UBRELVY 100 MG TABS: 100 | 30 days supply | Qty: 16 | Fill #0

## 2019-03-05 NOTE — Patient Instructions (Signed)
Take Aimovig every 28 days Ubrelvy as needed Follow up in 6 months

## 2019-03-08 NOTE — Progress Notes (Signed)
Received notice of approval from [facility] for patient AIMOVIG 140 MG  APPROVED  Effective for a max of 12 refills from 03/06/19- 03/04/2020  This request has been approved for 1 ML per 30 days  This medication must be filled or managed by a [facility] outpatient pharmacy Ref# [place] code: [phone]

## 2019-03-15 MED FILL — VYVANSE 70 MG CAPSULE: 70 | 30 days supply | Qty: 30 | Fill #0

## 2019-04-13 MED FILL — VYVANSE 70 MG CAPSULE: 70 | 30 days supply | Qty: 30 | Fill #0

## 2019-04-26 ENCOUNTER — Other Ambulatory Visit: Payer: Self-pay

## 2019-04-26 MED ORDER — AIMOVIG 140 MG/ML ~~LOC~~ SOAJ: 140.0000 mg | SUBCUTANEOUS | 11 refills | Status: DC

## 2019-05-13 MED FILL — VYVANSE 70 MG CAPSULE: 70 | 30 days supply | Qty: 30 | Fill #0

## 2019-06-01 ENCOUNTER — Other Ambulatory Visit: Payer: Self-pay | Admitting: Neurology

## 2019-06-04 ENCOUNTER — Ambulatory Visit: Payer: 59 | Attending: Internal Medicine

## 2019-06-04 ENCOUNTER — Ambulatory Visit: Payer: 59

## 2019-06-04 DIAGNOSIS — Z23 Encounter for immunization: Secondary | ICD-10-CM | POA: Insufficient documentation

## 2019-06-04 NOTE — Progress Notes (Signed)
   Covid-19 Vaccination Clinic  Name:  Jennifer Mayo    MRN: [id number] DOB: 11/10/1975  06/04/2019  [name] was observed post Covid-19 immunization for 15 minutes without incidence. She was provided with Vaccine Information Sheet and instruction to access the V-Safe system.   [name] was instructed to call 911 with any severe reactions post vaccine: Marland Kitchen Difficulty breathing  . Swelling of your face and throat  . A fast heartbeat  . A bad rash all over your body  . Dizziness and weakness    Immunizations Administered    Name Date Dose VIS Date Route   Pfizer COVID-19 Vaccine 06/04/2019 12:20 PM 0.3 mL 04/02/2019 Intramuscular   Manufacturer: [place]   Lot: [id number]   [place]: [id number]

## 2019-06-11 MED FILL — VYVANSE 70 MG CAPSULE: 70 | 30 days supply | Qty: 30 | Fill #0

## 2019-06-26 ENCOUNTER — Ambulatory Visit: Payer: 59 | Attending: Internal Medicine

## 2019-06-26 DIAGNOSIS — Z23 Encounter for immunization: Secondary | ICD-10-CM | POA: Insufficient documentation

## 2019-06-26 NOTE — Progress Notes (Signed)
   Covid-19 Vaccination Clinic  Name:  Jennifer Mayo    MRN: [id number] DOB: Mar 17, 1976  06/26/2019  Ms. Smotherman was observed post Covid-19 immunization for 15 minutes without incident. She was provided with Vaccine Information Sheet and instruction to access the V-Safe system.   Ms. Landesman was instructed to call 911 with any severe reactions post vaccine: Marland Kitchen Difficulty breathing  . Swelling of face and throat  . A fast heartbeat  . A bad rash all over body  . Dizziness and weakness   Immunizations Administered    Name Date Dose VIS Date Route   Pfizer COVID-19 Vaccine 06/26/2019  3:35 PM 0.3 mL 04/02/2019 Intramuscular   Manufacturer: [place]   Lot: [id number]   [place]: [id number]

## 2019-07-10 MED FILL — VYVANSE 70 MG CAPSULE: 70 | 30 days supply | Qty: 30 | Fill #0

## 2019-08-07 MED FILL — VYVANSE 70 MG CAPSULE: 70 | 30 days supply | Qty: 30 | Fill #0

## 2019-08-20 IMAGING — CT CT ANGIO NECK
2 of 7 series · 8 of 33 positions shown · IV contrast (iopamidol)
Comparison: Head CT 11/19/2017

CLINICAL DATA: Acute onset right-sided weakness. Last seen normal 3
hours earlier.

EXAM:
CT ANGIOGRAPHY HEAD AND NECK
TECHNIQUE: Multidetector CT imaging of the head and neck was performed using
the standard protocol during bolus administration of intravenous
contrast. Multiplanar CT image reconstructions and MIPs were
obtained to evaluate the vascular anatomy. Carotid stenosis
measurements (when applicable) are obtained utilizing NASCET
criteria, using the distal internal carotid diameter as the
denominator.
CONTRAST:  50mL 2LCG5H-0K3 IOPAMIDOL (2LCG5H-0K3) INJECTION 76%

[Series 6: cta neck · axial · 0.45mm/px · z∈[+1181,+1297]mm · 2 of 175 slices shown]
[im 59/175  soft-tissue]
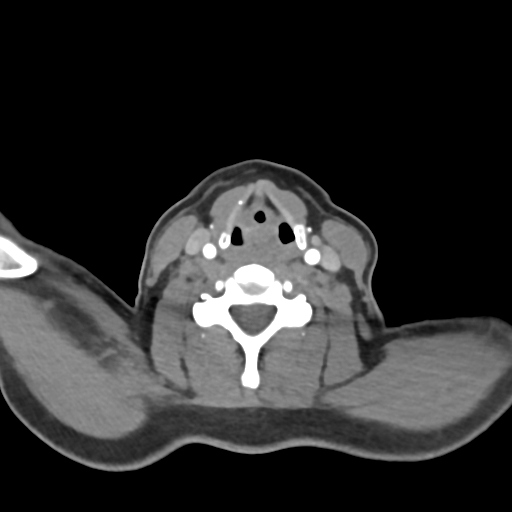
[im 117/175  soft-tissue]
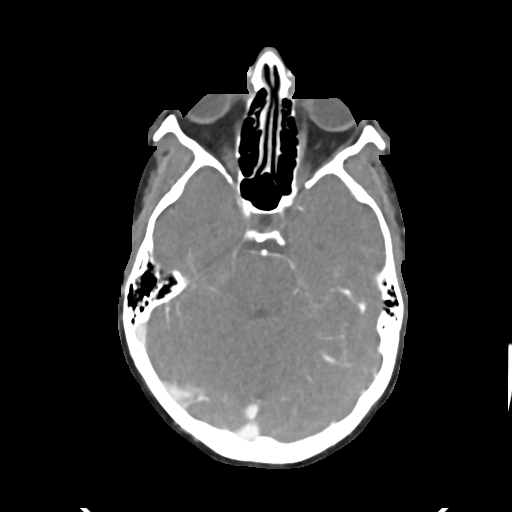

[Series 8: cta neck axial · axial · 0.39mm/px · z∈[+1114,+1361]mm · 6 of 347 slices shown]
[im 50/347  soft-tissue]
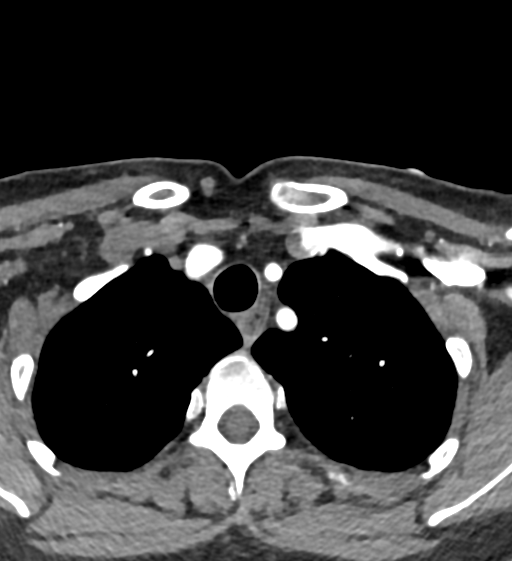
[im 99/347  bone]
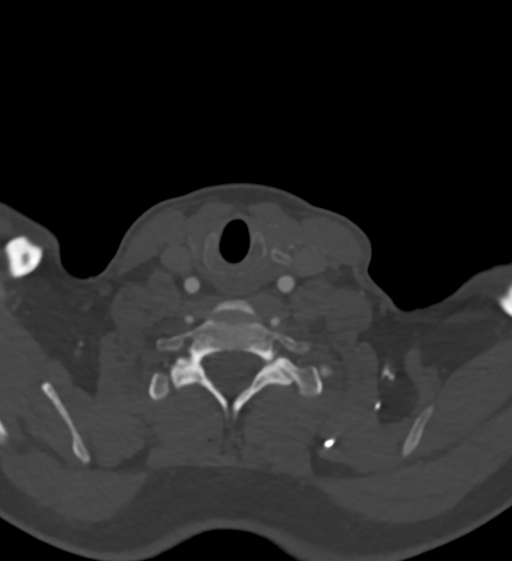
[im 149/347  soft-tissue]
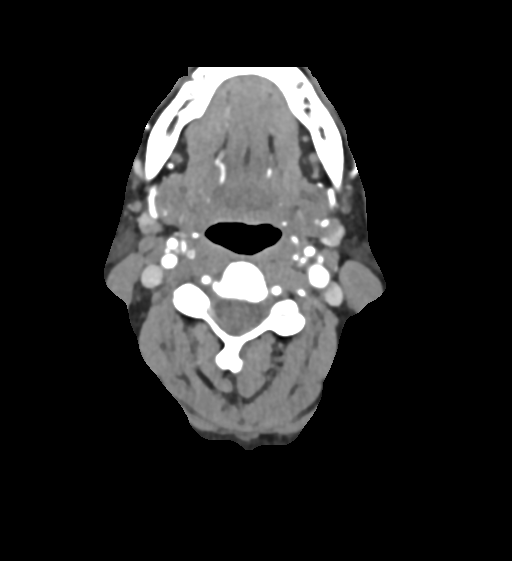
[im 198/347  bone]
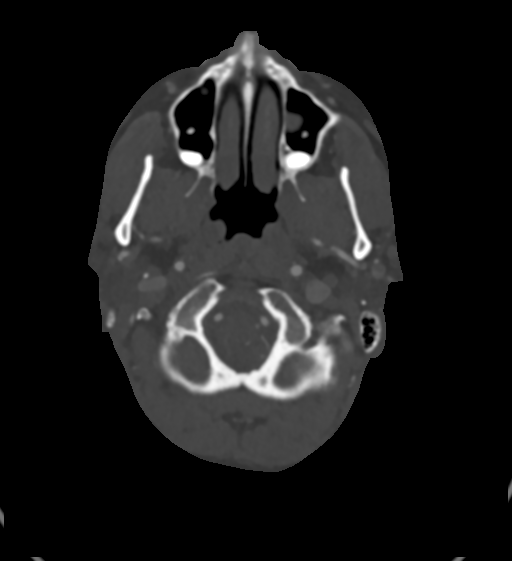
[im 248/347  soft-tissue]
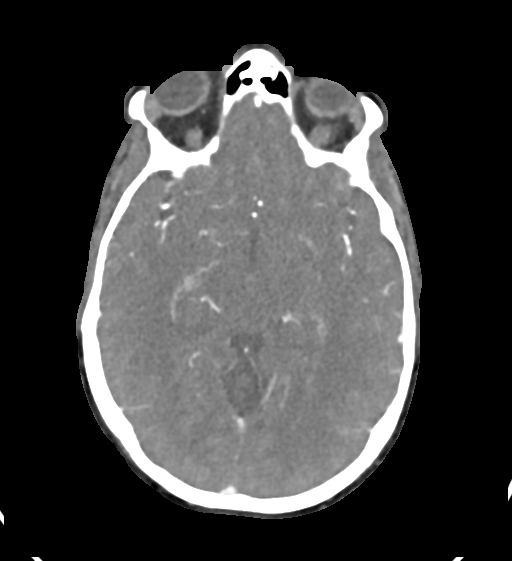
[im 297/347  bone]
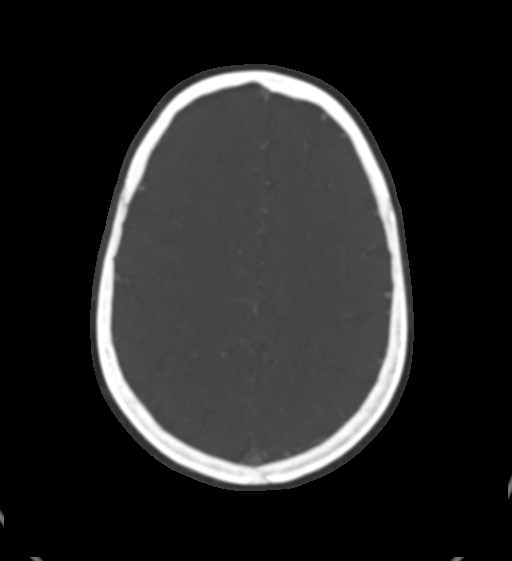

[8 of 33 positions shown; findings below may reference images not displayed]

FINDINGS: CTA NECK FINDINGS

AORTIC ARCH: There is no calcific atherosclerosis of the aortic
arch. There is no aneurysm, dissection or hemodynamically
significant stenosis of the visualized ascending aorta and aortic
arch. Conventional 3 vessel aortic branching pattern. The visualized
proximal subclavian arteries are widely patent.

RIGHT CAROTID SYSTEM:

--Common carotid artery: Widely patent origin without common carotid
artery dissection or aneurysm.

--Internal carotid artery: No dissection, occlusion or aneurysm. No
hemodynamically significant stenosis.

--External carotid artery: No acute abnormality.

LEFT CAROTID SYSTEM:

--Common carotid artery: Widely patent origin without common carotid
artery dissection or aneurysm.

--Internal carotid artery:No dissection, occlusion or aneurysm. No
hemodynamically significant stenosis.

--External carotid artery: No acute abnormality.

VERTEBRAL ARTERIES: Left dominant configuration. Both origins are
normal. No dissection, occlusion or flow-limiting stenosis to the
vertebrobasilar confluence.

SKELETON: There is no bony spinal canal stenosis. No lytic or
blastic lesion.

OTHER NECK: Normal pharynx, larynx and major salivary glands. No
cervical lymphadenopathy. Unremarkable thyroid gland.

UPPER CHEST: No pneumothorax or pleural effusion. No nodules or
masses.

CTA HEAD FINDINGS

ANTERIOR CIRCULATION:

--Intracranial internal carotid arteries: Normal.

--Anterior cerebral arteries: Normal. Both A1 segments are present.
Patent anterior communicating artery.

--Middle cerebral arteries: Normal.

--Posterior communicating arteries: Present bilaterally.

POSTERIOR CIRCULATION:

--Basilar artery: Normal.

--Posterior cerebral arteries: Normal.

--Superior cerebellar arteries: Normal.

--Inferior cerebellar arteries: Normal anterior and posterior
inferior cerebellar arteries.

VENOUS SINUSES: As permitted by contrast timing, patent.

ANATOMIC VARIANTS: None

DELAYED PHASE: No parenchymal contrast enhancement.

Review of the MIP images confirms the above findings.
IMPRESSION: Normal CTA of the head and neck.

## 2019-09-08 NOTE — Progress Notes (Signed)
Virtual Visit via Video Note The purpose of this virtual visit is to provide medical care while limiting exposure to the novel coronavirus.    Consent was obtained for video visit:  Yes.   Answered questions that patient had about telehealth interaction:  Yes.   I discussed the limitations, risks, security and privacy concerns of performing an evaluation and management service by telemedicine. I also discussed with the patient that there may be a patient responsible charge related to this service. The patient expressed understanding and agreed to proceed.  Pt location: Home Physician Location: office Name of referring provider:  Ferd Hibbs, NP I connected with Lorelle Gibbs at patients initiation/request on 09/10/2019 at  9:50 AM EDT by video enabled telemedicine application and verified that I am speaking with the correct person using two identifiers. Pt MRN:  [id number] Pt DOB:  1975/11/11 Video Participants:  Lorelle Gibbs   History of Present Illness:  Jennifer Mayo is a 39 year oldright-handed Caucasian woman with CHF, microvascular coronary artery disease, obstructive sleep apnea, narcolepsy, status post bariatric surgeryand history of endometrial cancer whofollows up for complicated migraines/hemiplegic migraines.  UPDATE: She has had daily mild-moderate auras (unilateral weakness, speech difficulty and feeling off balance usually lasting 20 to 30 minutes with [name]) occurring about once a week.  No headache.   Current NSAIDS:none Current analgesics:none Current triptans:none Current ergotamine:none Current anti-emetic:none Current muscle relaxants:none Current anti-anxiolytic:none Current sleep aide:none Current Antihypertensive medications:none Current Antidepressant medications:Vyvanse Current Anticonvulsant medications:none Current anti-CGRP:Aimovig 140mg ; [name] 100mg  Current Vitamins/Herbal/Supplements:none Current  Antihistamines/Decongestants:none Other therapy:none Other medication:Straterra   HISTORY: The patient was admitted to [facility] on 11/19/17 after presenting with right sided weakness involving the face, hand and leg. NIH stroke scale was 3 due to mild right sided weakness of face, upper and lower extremities. She received tPA. There was no associated headache, unilateral numbness, aphasia, or visual disturbance. MRI of brain personally reviewed and negative for acute stroke. CTA of head and neck personally reviewed and demonstrated no emergent or significant large vessel occlusion or stenosis. 2D echo showed EF 55-60% with no cardiac source of embolus, Due to recent prolonged car ride, she had lower extremity venous doppler performed, which was negative for DVT. LDL was 72 and Hgb A1c 5.2. HIV testing was negative. 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 Since then, she has a mild residual right sided heaviness, unsteadiness and memory and word-finding issues. Since hospitalization, she has had a constant left sided pressure-like to sharp headache that fluctuates in intensity from mild to severe. It becomes severe 5-10 minutes about 3 times a day. No associated symptoms such as nausea, photophobia, phonophobia, visual disturbance or worsening numbness or weakness. She was subsequently taken off of ASA due to consideration that the event was migraine. MRI of brain with and without contrast was performed as a follow up which was personally reviewed and was normal.   Around January 2020, she developed mild left sided weakness of arm and leg for 15 minutes. On her own, she restarted ASA every other day. Due to mild residual symptoms, she had a repeat MRI of brain with and without contrast on 08/07/18 which was personally reviewed and was normal.   She presented to the [facility] ED on 08/27/18 as a stroke code for sudden onset  dizziness, headache and now LEFT sided weakness. MRI and MRA of head personally reviewed were negative. Symptoms lasted into next day  She returned to the [facility]  [place] on 10/22/18 for sudden onset onset of weakness of all of her extremities,but primarily left sided,dizziness/lightheadedness (not spinning), tremors and difficulty speaking. She also had a mild headache/heaviness in the back of her head. Symptoms improved while in ED and resolved by next day.  Other spells: -  her right arm and hand felt weak with difficulty writing, lasting 60-90 minutes -  Episodes of possible aura, in which she had trouble speaking and right hand heaviness.  She denies prior history of migraines. In hindsight, she had an event in 1999 in which she developed a visual aura described as a crayon scribble that slowly grows to cover visual field of both eyes, lasting 30 minutes. It occurred a couple of years later, lasting 45 minutes. Both events were not associated with headache.  She was diagnosed with microvascular cardiac disease.  Past medications include: Amitriptyline (side effects), topiramate (side effects), beta blockers (hypotension); promethazine  Past Medical History: Past Medical History:  Diagnosis Date  . Anemia   . Cancer [facility])    endometrial  . Cardiac microvascular disease   . CHF (congestive heart failure) ([place])   . Depression   . History of endometrial cancer    had hysterectomy in August 2015  . Mild diastolic dysfunction   . Narcolepsy   . Obstructive sleep apnea   . Pleural effusion, bilateral    3-4 years   . Solitary pulmonary nodule on lung CT    follow up CT recommended per medical records  . Thyroid disease   . Vitamin B 12 deficiency     Medications: Outpatient Encounter Medications as of 09/10/2019  Medication Sig  . AIMOVIG 140 MG/ML SOAJ INJECT 140 MG INTO THE SKIN EVERY 30 DAYS.  Marland Kitchen Armodafinil 250 MG tablet Take 250 mg by mouth daily.  Marland Kitchen atomoxetine  (STRATTERA) 40 MG capsule   . benzonatate (TESSALON PERLES) 100 MG capsule Take 1 capsule (100 mg total) by mouth 3 (three) times daily as needed.  Marland Kitchen buPROPion (WELLBUTRIN XL) 300 MG 24 hr tablet   . Calcium Carb-Cholecalciferol (CALCIUM 1000 + D PO) Take 1 tablet by mouth 3 (three) times daily.  Marland Kitchen FLUoxetine (PROZAC) 40 MG capsule Take 40 mg by mouth daily.  . Levomefolate Glucosamine (METHYLFOLATE) 400 MCG CAPS Take 1 capsule by mouth daily.  Marland Kitchen lisdexamfetamine (VYVANSE) 70 MG capsule Take 70 mg by mouth daily.  . modafinil (PROVIGIL) 200 MG tablet   . Multiple Vitamins-Minerals (BARIATRIC MULTIVITAMINS/IRON PO) Take 1 tablet by mouth 2 (two) times daily.  . promethazine (PHENERGAN) 12.5 MG tablet Take 1 tablet (12.5 mg total) by mouth every 6 (six) hours as needed for nausea or vomiting.  Marland Kitchen Ubrogepant (UBRELVY) 100 MG TABS Take 1 tablet by mouth as needed (May repeat in 2 hours if needed.  Maximum 2 tablets in 24 hours).   No facility-administered encounter medications on file as of 09/10/2019.    Allergies: Allergies  Allergen Reactions  . Ranexa [Ranolazine] Shortness Of Breath  . Nsaids     Gastric bypass - ulcer risk  . Ranexa [Ranolazine Er]   . Topamax [Topiramate]   . Verapamil Other (See Comments)    hypotension    Family History: Family History  Problem Relation Age of Onset  . Cancer Mother   . Brain cancer Mother   . Seizures Mother   . Stroke Mother   . Breast cancer Mother 59  . Diabetes Father     Social History: Social History  Socioeconomic History  . Marital status: Married    Spouse name: Not on file  . Number of children: 3  . Years of education: Not on file  . Highest education level: Master's degree (e.g., MA, MS, MEng, MEd, MSW, MBA)  Occupational History  . Not on file  Tobacco Use  . Smoking status: Never Smoker  . Smokeless tobacco: Never Used  Substance and Sexual Activity  . Alcohol use: Yes    Alcohol/week: 0.0 standard drinks     Comment: very seldom  . Drug use: No  . Sexual activity: Not on file  Other Topics Concern  . Not on file  Social History Narrative   Pt is R handed   Lives in 3 story home with her husband and 2 (sometimes 3) children   Has 3 children   Masters Degree x2 Theatre stage manager, NP   Government social research officer    Social Determinants of Health   Financial Resource Strain:   . Difficulty of Paying Living Expenses:   Food Insecurity:   . Worried About Charity fundraiser in the Last Year:   . Arboriculturist in the Last Year:   Transportation Needs:   . Film/video editor (Medical):   Marland Kitchen Lack of Transportation (Non-Medical):   Physical Activity:   . Days of Exercise per Week:   . Minutes of Exercise per Session:   Stress:   . Feeling of Stress :   Social Connections:   . Frequency of Communication with Friends and Family:   . Frequency of Social Gatherings with Friends and Family:   . Attends Religious Services:   . Active Member of Clubs or Organizations:   . Attends Archivist Meetings:   Marland Kitchen Marital Status:   Intimate Partner Violence:   . Fear of Current or Ex-Partner:   . Emotionally Abused:   Marland Kitchen Physically Abused:   . Sexually Abused:     Observations/Objective:   There were no vitals taken for this visit. No acute distress.  Alert and oriented.  Speech fluent and not dysarthric.  Language intact.  Eyes orthophoric on primary gaze.  Face symmetric.  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  For preventative management, Aimovig 140mg  every 28 days 2.  For abortive therapy, Ubrelvy 100mg  3.  Limit use of pain relievers to no more than 2 days out of week to prevent risk of rebound or medication-overuse headache. 4.  Keep headache diary 5.  Exercise, hydration, caffeine cessation, sleep hygiene, monitor for and avoid triggers 6.  Follow up 6 months   Follow Up  Instructions:    -I discussed the assessment and treatment plan with the patient. The patient was provided an opportunity to ask questions and all were answered. The patient agreed with the plan and demonstrated an understanding of the instructions.   The patient was advised to call back or seek an in-person evaluation if the symptoms worsen or if the condition fails to improve as anticipated.    Dudley Major, DO

## 2019-09-09 MED FILL — VYVANSE 70 MG CAPSULE: 70 | 30 days supply | Qty: 30 | Fill #0

## 2019-09-10 ENCOUNTER — Other Ambulatory Visit: Payer: Self-pay

## 2019-09-10 ENCOUNTER — Encounter: Payer: Self-pay | Admitting: Neurology

## 2019-09-10 ENCOUNTER — Telehealth (INDEPENDENT_AMBULATORY_CARE_PROVIDER_SITE_OTHER): Payer: 59 | Admitting: Neurology

## 2019-09-10 ENCOUNTER — Other Ambulatory Visit: Payer: Self-pay | Admitting: Neurology

## 2019-09-10 DIAGNOSIS — G43409 Hemiplegic migraine, not intractable, without status migrainosus: Secondary | ICD-10-CM | POA: Diagnosis not present

## 2019-09-10 MED ORDER — AIMOVIG 140 MG/ML ~~LOC~~ SOAJ: SUBCUTANEOUS | 11 refills | Status: DC

## 2019-09-10 MED ORDER — UBRELVY 100 MG PO TABS: 1.0000 | ORAL_TABLET | ORAL | 11 refills | Status: DC | PRN

## 2019-09-10 MED FILL — AIMOVIG 140 MG/ML SOAJ: 140 | 30 days supply | Qty: 1 | Fill #0

## 2019-09-23 ENCOUNTER — Encounter: Payer: Self-pay | Admitting: Neurology

## 2019-09-23 NOTE — Progress Notes (Signed)
Jennifer Mayo (Key: [id number]) Rx #: [id number] [name] 100MG  tablets   Form MedImpact ePA Form 2017 NCPDP Created 13 days ago Sent to Plan 3 minutes ago Plan Response 3 minutes ago Submit Clinical Questions 1 minute ago Determination Favorable less than a minute ago Message from Plan The request has been approved. The authorization is effective for a maximum of 12 fills from 09/23/2019 to 09/21/2020, as long as the member is enrolled in their current health plan. This has been approved for a quantity limit of 16.0 with a day supply limit of 30.0. A written notification letter will follow with additional details.

## 2019-10-09 MED FILL — VYVANSE 70 MG CAPSULE: 70 | 30 days supply | Qty: 30 | Fill #0

## 2019-11-05 MED FILL — VYVANSE 70 MG CAPSULE: 70 | 30 days supply | Qty: 30 | Fill #0

## 2019-12-04 MED FILL — VYVANSE 70 MG CAPSULE: 70 | 30 days supply | Qty: 30 | Fill #0

## 2019-12-04 MED FILL — ARMODAFINIL 250 MG TABS: 250 | 30 days supply | Qty: 30 | Fill #0

## 2019-12-11 MED FILL — UBRELVY 100 MG TABS: 100 | 30 days supply | Qty: 16 | Fill #0

## 2019-12-11 MED FILL — AIMOVIG 140 MG/ML SOAJ: 140 | 30 days supply | Qty: 1 | Fill #1

## 2020-01-01 MED FILL — ARMODAFINIL 250 MG TABS: 250 | 90 days supply | Qty: 90 | Fill #0

## 2020-01-04 MED FILL — VYVANSE 70 MG CAPSULE: 70 | 30 days supply | Qty: 30 | Fill #0

## 2020-01-27 ENCOUNTER — Encounter (HOSPITAL_COMMUNITY): Payer: Self-pay

## 2020-02-05 ENCOUNTER — Other Ambulatory Visit (HOSPITAL_COMMUNITY): Payer: Self-pay | Admitting: Primary Care

## 2020-02-17 ENCOUNTER — Other Ambulatory Visit (HOSPITAL_COMMUNITY): Payer: Self-pay | Admitting: Primary Care

## 2020-02-17 MED FILL — VYVANSE 70 MG CAPSULE: 70 | 30 days supply | Qty: 30 | Fill #0

## 2020-02-24 DIAGNOSIS — M7918 Myalgia, other site: Secondary | ICD-10-CM | POA: Diagnosis not present

## 2020-02-24 DIAGNOSIS — Z889 Allergy status to unspecified drugs, medicaments and biological substances status: Secondary | ICD-10-CM | POA: Diagnosis not present

## 2020-02-24 DIAGNOSIS — Z886 Allergy status to analgesic agent status: Secondary | ICD-10-CM | POA: Diagnosis not present

## 2020-02-24 DIAGNOSIS — M791 Myalgia, unspecified site: Secondary | ICD-10-CM | POA: Diagnosis not present

## 2020-02-24 DIAGNOSIS — Z888 Allergy status to other drugs, medicaments and biological substances status: Secondary | ICD-10-CM | POA: Diagnosis not present

## 2020-02-24 DIAGNOSIS — Z90710 Acquired absence of both cervix and uterus: Secondary | ICD-10-CM | POA: Diagnosis not present

## 2020-02-24 DIAGNOSIS — S199XXA Unspecified injury of neck, initial encounter: Secondary | ICD-10-CM | POA: Diagnosis not present

## 2020-02-24 DIAGNOSIS — Z8679 Personal history of other diseases of the circulatory system: Secondary | ICD-10-CM | POA: Diagnosis not present

## 2020-02-24 DIAGNOSIS — Z743 Need for continuous supervision: Secondary | ICD-10-CM | POA: Diagnosis not present
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
## 2020-03-13 ENCOUNTER — Encounter: Payer: Self-pay | Admitting: Neurology

## 2020-03-13 ENCOUNTER — Ambulatory Visit: Payer: 59 | Admitting: Neurology

## 2020-03-13 DIAGNOSIS — Z029 Encounter for administrative examinations, unspecified: Secondary | ICD-10-CM

## 2020-03-24 ENCOUNTER — Other Ambulatory Visit (HOSPITAL_COMMUNITY): Payer: Self-pay | Admitting: Primary Care

## 2020-03-24 MED FILL — NOREL AD TABLET: 4-10-325 | 3 days supply | Qty: 20 | Fill #0

## 2020-03-24 MED FILL — VYVANSE 70 MG CAPSULE: 70 | 30 days supply | Qty: 30 | Fill #0

## 2020-03-27 MED FILL — ARMODAFINIL 250 MG TABS: 250 | 30 days supply | Qty: 30 | Fill #1

## 2020-03-29 ENCOUNTER — Encounter: Payer: Self-pay | Admitting: Neurology

## 2020-03-29 NOTE — Progress Notes (Signed)
Jennifer Mayo (Key: [id number]) Rx #: [id number] Aimovig 140MG /ML auto-injectors   Form MedImpact ePA Form 2017 NCPDP Created 2 days ago Sent to Plan 16 minutes ago Plan Response 16 minutes ago Submit Clinical Questions 16 minutes ago Determination Favorable 15 minutes ago Message from Plan The request has been approved. The authorization is effective for a maximum of 12 fills from 03/29/2020 to 03/28/2021, as long as the member is enrolled in their current health plan. This has been approved for a quantity limit of 1.0 with a day supply limit of 30.0. A written notification letter will follow with additional details.

## 2020-04-04 ENCOUNTER — Other Ambulatory Visit (HOSPITAL_COMMUNITY): Payer: Self-pay | Admitting: Primary Care

## 2020-04-05 MED FILL — AMPHETAMINE-DEXTROAMPHETAMI: 30 | 30 days supply | Qty: 60 | Fill #0

## 2020-04-21 MED FILL — UBRELVY 100 MG TABS: 100 | 30 days supply | Qty: 30 | Fill #0

## 2020-05-01 MED FILL — UBRELVY 100 MG TABS: 100 | 90 days supply | Qty: 30 | Fill #0

## 2020-05-06 MED FILL — ARMODAFINIL 250 MG TABLET: 250 | 90 days supply | Qty: 90 | Fill #0

## 2020-05-06 MED FILL — VYVANSE 70 MG CAPSULE: 70 | 30 days supply | Qty: 30 | Fill #0

## 2020-05-09 ENCOUNTER — Other Ambulatory Visit (HOSPITAL_COMMUNITY): Payer: Self-pay | Admitting: Primary Care

## 2020-05-09 MED FILL — AIMOVIG 140 MG/ML SOAJ: 140 | 30 days supply | Qty: 1 | Fill #0

## 2020-06-07 MED FILL — VYVANSE 70 MG CAPSULE: 70 | 30 days supply | Qty: 30 | Fill #0

## 2020-07-21 ENCOUNTER — Other Ambulatory Visit (HOSPITAL_COMMUNITY): Payer: Self-pay | Admitting: Unknown Physician Specialty

## 2020-07-22 ENCOUNTER — Other Ambulatory Visit (HOSPITAL_COMMUNITY): Payer: Self-pay

## 2020-07-22 MED FILL — Lisdexamfetamine Dimesylate Cap 70 MG: ORAL | 30 days supply | Qty: 30 | Fill #0 | Status: CN

## 2020-07-25 ENCOUNTER — Other Ambulatory Visit (HOSPITAL_COMMUNITY): Payer: Self-pay

## 2020-07-27 ENCOUNTER — Other Ambulatory Visit (HOSPITAL_COMMUNITY): Payer: Self-pay

## 2020-07-27 MED FILL — Lisdexamfetamine Dimesylate Cap 70 MG: ORAL | 30 days supply | Qty: 30 | Fill #0 | Status: CN

## 2020-07-28 ENCOUNTER — Other Ambulatory Visit (HOSPITAL_COMMUNITY): Payer: Self-pay

## 2020-07-31 ENCOUNTER — Other Ambulatory Visit (HOSPITAL_COMMUNITY): Payer: Self-pay

## 2020-08-15 ENCOUNTER — Other Ambulatory Visit (HOSPITAL_COMMUNITY): Payer: Self-pay

## 2020-09-08 NOTE — Progress Notes (Signed)
Key [id number] is approved till January member has an active PA on file which is expiring on 04/27/2021 and 12 no of fills remaining.

## 2020-09-19 ENCOUNTER — Ambulatory Visit (HOSPITAL_COMMUNITY): Payer: Self-pay | Admitting: Licensed Clinical Social Worker

## 2020-09-19 ENCOUNTER — Encounter (HOSPITAL_COMMUNITY): Payer: Self-pay | Admitting: Registered Nurse

## 2020-09-19 ENCOUNTER — Other Ambulatory Visit: Payer: Self-pay

## 2020-09-19 ENCOUNTER — Ambulatory Visit (HOSPITAL_COMMUNITY)
Admission: EM | Admit: 2020-09-19 | Discharge: 2020-09-19 | Disposition: A | Discharge status: Home / Self Care | Payer: 59 | Attending: Registered Nurse | Admitting: Registered Nurse

## 2020-09-19 DIAGNOSIS — F331 Major depressive disorder, recurrent, moderate: Secondary | ICD-10-CM | POA: Diagnosis not present

## 2020-09-19 DIAGNOSIS — F4323 Adjustment disorder with mixed anxiety and depressed mood: Secondary | ICD-10-CM | POA: Diagnosis present

## 2020-09-19 DIAGNOSIS — F1020 Alcohol dependence, uncomplicated: Secondary | ICD-10-CM | POA: Diagnosis not present

## 2020-09-19 DIAGNOSIS — F32A Depression, unspecified: Secondary | ICD-10-CM | POA: Diagnosis present

## 2020-09-19 HISTORY — DX: Adjustment disorder with mixed anxiety and depressed mood: F43.23

## 2020-09-19 NOTE — ED Notes (Signed)
Pt discharged in no acute distress. Verbalized understanding of instructions reviewed on AVS. Safety maintained.

## 2020-09-19 NOTE — BH Assessment (Signed)
TTS triage: Patient presents to [facility] voluntarily after being referred by her brother (who called with the false information that he was her doctor). Patient endorses increasingly depressed mood, panic, and an increase in her alcohol consumption. She denies SI/HI/AVH. She is interested in outpatient services.   Patient is routine.
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
## 2020-12-21 ENCOUNTER — Other Ambulatory Visit (HOSPITAL_COMMUNITY): Payer: Self-pay

## 2020-12-21 DIAGNOSIS — D649 Anemia, unspecified: Secondary | ICD-10-CM | POA: Diagnosis not present

## 2020-12-21 MED ORDER — UBRELVY 100 MG PO TABS
ORAL_TABLET | ORAL | 3 refills | Status: DC
  Filled 2020-12-21: qty 12, 30d supply, fill #0

## 2020-12-21 MED ORDER — HYDROCHLOROTHIAZIDE 25 MG PO TABS
ORAL_TABLET | ORAL | 1 refills | Status: DC
  Filled 2020-12-21: qty 90, 90d supply, fill #0
  Filled 2021-05-25: qty 90, 90d supply, fill #1

## 2020-12-21 MED ORDER — BUPROPION HCL ER (XL) 150 MG PO TB24
150.0000 mg | ORAL_TABLET | Freq: Every morning | ORAL | 1 refills | Status: DC
  Filled 2020-12-21: qty 90, 90d supply, fill #0
  Filled 2021-05-25: qty 90, 90d supply, fill #1

## 2020-12-21 MED ORDER — FLUOXETINE HCL 40 MG PO CAPS
40.0000 mg | ORAL_CAPSULE | Freq: Every day | ORAL | 1 refills | Status: DC
  Filled 2020-12-21: qty 90, 90d supply, fill #0
  Filled 2021-05-25: qty 90, 90d supply, fill #1

## 2020-12-21 MED ORDER — AIMOVIG 140 MG/ML ~~LOC~~ SOAJ
SUBCUTANEOUS | 3 refills | Status: DC
  Filled 2020-12-21: qty 3, 90d supply, fill #0

## 2020-12-21 MED ORDER — VYVANSE 70 MG PO CAPS
ORAL_CAPSULE | ORAL | 0 refills | Status: DC
  Filled 2020-12-21: qty 30, 30d supply, fill #0

## 2020-12-22 ENCOUNTER — Other Ambulatory Visit (HOSPITAL_COMMUNITY): Payer: Self-pay

## 2020-12-23 ENCOUNTER — Other Ambulatory Visit (HOSPITAL_COMMUNITY): Payer: Self-pay

## 2020-12-23 MED FILL — Erenumab-aooe Subcutaneous Soln Auto-Injector 140 MG/ML: SUBCUTANEOUS | 30 days supply | Qty: 1 | Fill #0 | Status: AC

## 2020-12-26 ENCOUNTER — Other Ambulatory Visit (HOSPITAL_COMMUNITY): Payer: Self-pay

## 2020-12-27 ENCOUNTER — Other Ambulatory Visit (HOSPITAL_COMMUNITY): Payer: Self-pay

## 2020-12-28 ENCOUNTER — Other Ambulatory Visit (HOSPITAL_COMMUNITY): Payer: Self-pay

## 2020-12-29 ENCOUNTER — Other Ambulatory Visit (HOSPITAL_COMMUNITY): Payer: Self-pay

## 2021-01-26 ENCOUNTER — Encounter (HOSPITAL_COMMUNITY): Payer: Self-pay | Admitting: *Deleted

## 2021-03-19 ENCOUNTER — Telehealth: Payer: Self-pay

## 2021-03-19 NOTE — Telephone Encounter (Signed)
New message   [employer] is unable to respond with clinical questions. Please see more information at the bottom of the page for next steps.  Jennifer Mayo (Key: [id number]) Aimovig 140MG /ML auto-injectors   Form [employer] ePA Form 2017 NCPDP Created 4 days ago Sent to Plan less than a minute ago Plan Response less than a minute ago Submit Clinical Questions Determination Message from Plan Member has an active PA on file which is expiring on 12/28/2021 and has 12 no. of fills remaining.

## 2021-04-12 ENCOUNTER — Ambulatory Visit: Payer: 59 | Admitting: Neurology

## 2021-04-12 ENCOUNTER — Encounter: Payer: Self-pay | Admitting: Neurology

## 2021-04-12 VITALS — BP 148/85 | HR 76 | Ht 66.0 in | Wt 206.0 lb

## 2021-04-12 DIAGNOSIS — G478 Other sleep disorders: Secondary | ICD-10-CM

## 2021-04-12 DIAGNOSIS — L659 Nonscarring hair loss, unspecified: Secondary | ICD-10-CM

## 2021-04-12 DIAGNOSIS — G43409 Hemiplegic migraine, not intractable, without status migrainosus: Secondary | ICD-10-CM | POA: Diagnosis not present

## 2021-04-12 DIAGNOSIS — Z9884 Bariatric surgery status: Secondary | ICD-10-CM

## 2021-04-12 DIAGNOSIS — G4733 Obstructive sleep apnea (adult) (pediatric): Secondary | ICD-10-CM | POA: Diagnosis not present

## 2021-04-12 NOTE — Patient Instructions (Signed)
Eating Plan After Bariatric Surgery, Stage 3 A diet after weight loss surgery (bariatric surgery) should provide plenty of fluids and nutrients while promoting weight loss and healing. Each stage of recovery has a different set of food and drink recommendations. Your health care provider may recommend that you work with a diet and nutrition specialist (dietitian) to make a staged eating plan that is right for you. You may start to transition to a stage 3 diet about 5-6 weeks after surgery. During this stage, you will be allowed to eat foods of various textures. Continue to follow the guidelines below until your health care provider or dietitian approves eating a regular weight-maintenance diet. What are tips for following this plan? Cooking Use low-fat cooking methods, such as baking, broiling, boiling, or grilling. Cook using healthy fats, such as olive, sunflower, grapeseed, or canola oil. Cook and bake using no-calorie sweeteners. Avoid adding extra salt, sugar, or fat to foods when cooking. Meal planning Eat 3 meals and 2 snacks, or 5 small meals each day. Eat at set times. Allow 30-45 minutes for each meal. Do not skip meals or go a long time without eating (fast). If you are having a hard time eating, talk to your health care provider or dietitian. Limit food intake to -1 cup per meal. As you heal and advance, you may be able to eat a little more with each meal. Always listen to your body. Eat foods from all food groups. This includes fruits and vegetables, grains, dairy, and meat and other proteins. Limit carbohydrate intake to no more than 30 g per meal or 130 g per day. There are about 15-20 g of carbohydrates in 1 piece of bread or a medium piece of fruit. Half of your total grains should be whole grains. Include a protein-rich food at every meal and snack, and eat the protein food first. Eat 60-80 g of protein a day when possible. General instructions Work with your dietitian to slowly  add new foods to your diet. Stay hydrated. Drink at least 48-64 oz of noncarbonated, zero-calorie fluid per day. Water is the best choice. Limit alcohol intake as directed by your health care provider. Avoid foods that are high in fat or have added sugar. Take any vitamin supplements as directed by your health care provider. Ask your dietitian to help you with meal planning and strategies to continue to lose weight and maintain weight loss. Recommended foods Fruits All fresh, frozen, or canned fruit. Vegetables All fresh, frozen, or canned vegetables. Grains Whole wheat bread, crackers, and pasta. Rice. Unsweetened hot and cold cereals. Meats and other protein foods Lean meat, poultry, and fish. Eggs and egg substitutes. Beans and lentils. Smooth nut butters. Soy products, such as tempeh and tofu. Dairy Low or nonfat milk, cheese, and yogurt. Sugar-free pudding. Cottage cheese. Beverages Decaffeinated coffee and tea. Sugar-free, caffeine-free soft drinks. Fats and oils Avocado. Olive, sunflower, grapeseed, or canola oil. Sweets and desserts Low-fat, sugar-free desserts. Seasoning and other foods Low-fat, low-sodium condiments. The items listed above may not be a complete list of foods and beverages you can eat. Contact a dietitian for more information. Foods to avoid Hard-to-digest foods, including: Popcorn. Nuts and seeds. Celery. White parts of citrus fruits (pith). Caffeinated drinks, including: Coffee and tea. Energy drinks. Sweets and desserts with more than 25 g of sugar per serving. The items listed above may not be a complete list of foods and beverages you should avoid. Contact a dietitian for more information. Summary Stage  3 diet starts about 5-6 weeks after surgery. Continue to follow stage 3 guidelines until your health care provider or dietitian approves eating a regular weight-maintenance diet. Stage 3 diet involves eating a balanced, healthy diet with  foods of various textures. This diet helps you continue to lose weight and maintain weight loss. Eating enough protein and drinking plenty of fluids is important for promoting weight loss and healing after surgery. This information is not intended to replace advice given to you by your health care provider. Make sure you discuss any questions you have with your health care provider. Document Revised: 07/19/2019 Document Reviewed: 08/12/2019 Elsevier Patient Education  2022 [employer].
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
## 2021-05-25 ENCOUNTER — Other Ambulatory Visit (HOSPITAL_COMMUNITY): Payer: Self-pay

## 2021-05-25 ENCOUNTER — Other Ambulatory Visit: Payer: Self-pay | Admitting: Neurology

## 2021-05-26 ENCOUNTER — Other Ambulatory Visit (HOSPITAL_COMMUNITY): Payer: Self-pay

## 2021-05-26 ENCOUNTER — Telehealth: Payer: 59 | Admitting: Nurse Practitioner

## 2021-05-26 DIAGNOSIS — J0110 Acute frontal sinusitis, unspecified: Secondary | ICD-10-CM | POA: Diagnosis not present

## 2021-05-26 MED ORDER — AMOXICILLIN-POT CLAVULANATE 875-125 MG PO TABS
1.0000 | ORAL_TABLET | Freq: Two times a day (BID) | ORAL | 0 refills | Status: DC
  Filled 2021-05-26: qty 14, 7d supply, fill #0

## 2021-05-26 NOTE — Progress Notes (Signed)

## 2021-05-28 ENCOUNTER — Other Ambulatory Visit (HOSPITAL_COMMUNITY): Payer: Self-pay

## 2021-05-28 ENCOUNTER — Encounter: Payer: Self-pay | Admitting: Neurology

## 2021-05-31 ENCOUNTER — Other Ambulatory Visit (HOSPITAL_COMMUNITY): Payer: Self-pay

## 2021-06-04 ENCOUNTER — Encounter: Payer: Self-pay | Admitting: Neurology

## 2021-06-11 ENCOUNTER — Ambulatory Visit (INDEPENDENT_AMBULATORY_CARE_PROVIDER_SITE_OTHER): Payer: 59 | Admitting: Neurology

## 2021-06-11 DIAGNOSIS — G43409 Hemiplegic migraine, not intractable, without status migrainosus: Secondary | ICD-10-CM

## 2021-06-11 DIAGNOSIS — L659 Nonscarring hair loss, unspecified: Secondary | ICD-10-CM

## 2021-06-11 DIAGNOSIS — G4733 Obstructive sleep apnea (adult) (pediatric): Secondary | ICD-10-CM | POA: Diagnosis not present

## 2021-06-11 DIAGNOSIS — Z9884 Bariatric surgery status: Secondary | ICD-10-CM

## 2021-06-11 DIAGNOSIS — G478 Other sleep disorders: Secondary | ICD-10-CM

## 2021-06-13 ENCOUNTER — Other Ambulatory Visit (HOSPITAL_COMMUNITY): Payer: Self-pay
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
## 2021-06-14 ENCOUNTER — Other Ambulatory Visit (HOSPITAL_COMMUNITY): Payer: Self-pay

## 2021-06-19 DIAGNOSIS — Z9884 Bariatric surgery status: Secondary | ICD-10-CM | POA: Insufficient documentation

## 2021-06-19 DIAGNOSIS — G478 Other sleep disorders: Secondary | ICD-10-CM | POA: Insufficient documentation
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
## 2021-06-19 NOTE — Progress Notes (Signed)
This HST confirms the presence of very mild sleep apnea.  While the apnea is clearly REM sleep dependent it is also clearly dependent on supine sleep.   RECOMMENDATION: My first order for this patient would be to avoid supine sleep and sleep on her right side which is associated with a very low AHI.  In addition she should be able to control apnea and snoring with a dental device-  but she could also use CPAP again.    Should the patient choose to use CPAP again I would order an auto titration device for her with a setting between 5 and 15 cmH2O, 2 cm EPR, heated humidification and a mask of her choice.  Her facial structure certainly has changed since the weight loss.  I would like for her to be fitted for a mask.  I prefer a ResMed device.    The exhaustion and fatigue that she describes may not be related to such rather mild apnea. I have not placed a CPAP order or dental referral- depending on this patients preferences.

## 2021-06-20 ENCOUNTER — Telehealth: Payer: Self-pay

## 2021-06-20 NOTE — Telephone Encounter (Signed)
-----   Message from Larey Seat, MD sent at 06/19/2021  6:56 PM EST ----- ? This HST confirms the presence of very mild sleep apnea.  While the apnea is clearly REM sleep dependent it is also clearly dependent on supine sleep.  ?? ?RECOMMENDATION: My first order for this patient would be to avoid supine sleep and sleep on her right side which is associated with a very low AHI.  In addition she should be able to control apnea and snoring with a dental device- ? but she could also use CPAP again.   ?? ?Should the patient choose to use CPAP again I would order an auto titration device for her with a setting between 5 and 15 cmH2O, 2 cm EPR, heated humidification and a mask of her choice.  Her facial structure certainly has changed since the weight loss.  I would like for her to be fitted for a mask.  I prefer a ResMed device. ?? ?? ? The exhaustion and fatigue that she describes may not be related to such rather mild apnea. ?I have not placed a CPAP order or dental referral- depending on this patients preferences.  ?? ?

## 2021-06-20 NOTE — Telephone Encounter (Signed)
I called pt. No answer, left a message asking pt to call me back.   

## 2021-06-25 ENCOUNTER — Encounter: Payer: Self-pay | Admitting: Neurology

## 2021-08-21 ENCOUNTER — Other Ambulatory Visit (HOSPITAL_COMMUNITY): Payer: Self-pay

## 2021-08-21 ENCOUNTER — Telehealth: Payer: 59 | Admitting: Family Medicine

## 2021-08-21 DIAGNOSIS — H10022 Other mucopurulent conjunctivitis, left eye: Secondary | ICD-10-CM | POA: Diagnosis not present

## 2021-08-21 MED ORDER — POLYMYXIN B-TRIMETHOPRIM 10000-0.1 UNIT/ML-% OP SOLN
1.0000 [drp] | Freq: Four times a day (QID) | OPHTHALMIC | 0 refills | Status: DC
  Filled 2021-08-21: qty 10, 30d supply, fill #0

## 2021-08-21 NOTE — Progress Notes (Signed)

## 2021-08-29 ENCOUNTER — Other Ambulatory Visit (HOSPITAL_COMMUNITY): Payer: Self-pay

## 2022-01-11 ENCOUNTER — Encounter (HOSPITAL_COMMUNITY): Payer: Self-pay | Admitting: *Deleted

## 2022-01-25 DIAGNOSIS — Z20822 Contact with and (suspected) exposure to covid-19: Secondary | ICD-10-CM | POA: Diagnosis not present

## 2022-01-27 DIAGNOSIS — U071 COVID-19: Secondary | ICD-10-CM | POA: Diagnosis not present

## 2022-03-04 DIAGNOSIS — Z23 Encounter for immunization: Secondary | ICD-10-CM | POA: Diagnosis not present

## 2022-03-04 DIAGNOSIS — Z111 Encounter for screening for respiratory tuberculosis: Secondary | ICD-10-CM | POA: Diagnosis not present

## 2022-08-21 ENCOUNTER — Encounter: Payer: Self-pay | Admitting: Family Medicine

## 2022-08-26 ENCOUNTER — Encounter: Payer: Self-pay | Admitting: Family Medicine

## 2022-08-26 ENCOUNTER — Ambulatory Visit: Payer: BC Managed Care – PPO | Admitting: Family Medicine

## 2022-08-26 VITALS — BP 142/98 | HR 67 | Temp 98.5°F | Ht 64.17 in | Wt 176.8 lb

## 2022-08-26 DIAGNOSIS — R011 Cardiac murmur, unspecified: Secondary | ICD-10-CM | POA: Diagnosis not present

## 2022-08-26 DIAGNOSIS — Z1231 Encounter for screening mammogram for malignant neoplasm of breast: Secondary | ICD-10-CM

## 2022-08-26 DIAGNOSIS — R0600 Dyspnea, unspecified: Secondary | ICD-10-CM

## 2022-08-26 DIAGNOSIS — Z0000 Encounter for general adult medical examination without abnormal findings: Secondary | ICD-10-CM

## 2022-08-26 DIAGNOSIS — F330 Major depressive disorder, recurrent, mild: Secondary | ICD-10-CM | POA: Diagnosis not present

## 2022-08-26 DIAGNOSIS — Z7689 Persons encountering health services in other specified circumstances: Secondary | ICD-10-CM

## 2022-08-26 MED ORDER — AMLODIPINE BESYLATE 2.5 MG PO TABS: 2.5000 mg | ORAL_TABLET | Freq: Every day | ORAL | 0 refills | Status: DC

## 2022-08-26 MED ORDER — BUPROPION HCL ER (XL) 150 MG PO TB24: 150.0000 mg | ORAL_TABLET | Freq: Every day | ORAL | 0 refills | Status: DC
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
## 2022-08-26 NOTE — Patient Instructions (Signed)
No follow-ups on file.        Great to see you today.  I have refilled the medication(s) we provide.   If labs were collected, we will inform you of lab results once received either by echart message or telephone call.   - echart message- for normal results that have been seen by the patient already.   - telephone call: abnormal results or if patient has not viewed results in their echart.  

## 2022-08-31 ENCOUNTER — Telehealth: Payer: BC Managed Care – PPO | Admitting: Nurse Practitioner

## 2022-08-31 DIAGNOSIS — J020 Streptococcal pharyngitis: Secondary | ICD-10-CM

## 2022-08-31 MED ORDER — AMOXICILLIN 500 MG PO CAPS: 500.0000 mg | ORAL_CAPSULE | Freq: Two times a day (BID) | ORAL | 0 refills | Status: AC

## 2022-08-31 NOTE — Progress Notes (Signed)
I have spent 5 minutes in review of e-visit questionnaire, review and updating patient chart, medical decision making and response to patient.  ° °Tejay Hubert W Liel Rudden, NP ° °  °

## 2022-08-31 NOTE — Addendum Note (Signed)
Addended by: Bertram Denver on: 08/31/2022 11:01 AM   Modules accepted: Orders

## 2022-08-31 NOTE — Progress Notes (Addendum)

## 2022-08-31 NOTE — Progress Notes (Signed)
Unless you have been exposed to someone with a positive strep test who is currently being treated with an antibiotic we would not at this time prescribe antibiotics and would consider this viral pharyngitis.  After careful review of your answers, I would not recommend an antibiotic for your condition.  Antibiotics should not be used to treat conditions that we suspect are caused by viruses like the virus that causes the common cold or flu. However, some people can have Strep with atypical symptoms. You may need formal testing in clinic or office to confirm if your symptoms continue or worsen.   E-Visit for Sore Throat  We are sorry that you are not feeling well.  Here is how we plan to help!  Your symptoms indicate a likely viral infection (Pharyngitis).   Pharyngitis is inflammation in the back of the throat which can cause a sore throat, scratchiness and sometimes difficulty swallowing.   Pharyngitis is typically caused by a respiratory virus and will just run its course.  Please keep in mind that your symptoms could last up to 10 days.  For throat pain, we recommend over the counter oral pain relief medications such as acetaminophen or aspirin, or anti-inflammatory medications such as ibuprofen or naproxen sodium.  Topical treatments such as oral throat lozenges or sprays may be used as needed.  Avoid close contact with loved ones, especially the very young and elderly.  Remember to wash your hands thoroughly throughout the day as this is the number one way to prevent the spread of infection and wipe down door knobs and counters with disinfectant.  Providers prescribe antibiotics to treat infections caused by bacteria. Antibiotics are very powerful in treating bacterial infections when they are used properly.  To maintain their effectiveness, they should be used only when necessary.  Overuse of antibiotics has resulted in the development of super bugs that are resistant to treatment!    Home  Care: Only take medications as instructed by your medical team. Do not drink alcohol while taking these medications. A steam or ultrasonic humidifier can help congestion.  You can place a towel over your head and breathe in the steam from hot water coming from a faucet. Avoid close contacts especially the very young and the elderly. Cover your mouth when you cough or sneeze. Always remember to wash your hands.  Get Help Right Away If: You develop worsening fever or throat pain. You develop a severe head ache or visual changes. Your symptoms persist after you have completed your treatment plan.  Make sure you Understand these instructions. Will watch your condition. Will get help right away if you are not doing well or get worse.   Thank you for choosing an e-visit.  Your e-visit answers were reviewed by a board certified advanced clinical practitioner to complete your personal care plan. Depending upon the condition, your plan could have included both over the counter or prescription medications.  Please review your pharmacy choice. Make sure the pharmacy is open so you can pick up prescription now. If there is a problem, you may contact your provider through [employer] and have the prescription routed to another pharmacy.  Your safety is important to [place]. If you have drug allergies check your prescription carefully.   For the next 24 hours you can use MyChart to ask questions about today's visit, request a non-urgent call back, or ask for a work or school excuse. You will get an email in the next two days asking about  your experience. I hope that your e-visit has been valuable and will speed your recovery.

## 2022-09-23 DIAGNOSIS — G43919 Migraine, unspecified, intractable, without status migrainosus: Secondary | ICD-10-CM | POA: Diagnosis not present

## 2022-09-23 DIAGNOSIS — I10 Essential (primary) hypertension: Secondary | ICD-10-CM | POA: Diagnosis not present

## 2022-09-25 ENCOUNTER — Ambulatory Visit (HOSPITAL_BASED_OUTPATIENT_CLINIC_OR_DEPARTMENT_OTHER): Payer: BC Managed Care – PPO

## 2022-09-26 ENCOUNTER — Ambulatory Visit (INDEPENDENT_AMBULATORY_CARE_PROVIDER_SITE_OTHER): Payer: BC Managed Care – PPO | Admitting: Family Medicine

## 2022-09-26 ENCOUNTER — Encounter: Payer: Self-pay | Admitting: Family Medicine

## 2022-09-26 VITALS — BP 123/77 | HR 83 | Temp 98.1°F | Ht 65.0 in | Wt 173.8 lb

## 2022-09-26 DIAGNOSIS — Z9884 Bariatric surgery status: Secondary | ICD-10-CM

## 2022-09-26 DIAGNOSIS — E538 Deficiency of other specified B group vitamins: Secondary | ICD-10-CM

## 2022-09-26 DIAGNOSIS — G43409 Hemiplegic migraine, not intractable, without status migrainosus: Secondary | ICD-10-CM | POA: Diagnosis not present

## 2022-09-26 DIAGNOSIS — I2585 Chronic coronary microvascular dysfunction: Secondary | ICD-10-CM | POA: Diagnosis not present

## 2022-09-26 DIAGNOSIS — Z0000 Encounter for general adult medical examination without abnormal findings: Secondary | ICD-10-CM

## 2022-09-26 DIAGNOSIS — Z131 Encounter for screening for diabetes mellitus: Secondary | ICD-10-CM

## 2022-09-26 DIAGNOSIS — Z1159 Encounter for screening for other viral diseases: Secondary | ICD-10-CM

## 2022-09-26 DIAGNOSIS — I519 Heart disease, unspecified: Secondary | ICD-10-CM

## 2022-09-26 DIAGNOSIS — F330 Major depressive disorder, recurrent, mild: Secondary | ICD-10-CM

## 2022-09-26 DIAGNOSIS — Z823 Family history of stroke: Secondary | ICD-10-CM

## 2022-09-26 DIAGNOSIS — I10 Essential (primary) hypertension: Secondary | ICD-10-CM | POA: Diagnosis not present

## 2022-09-26 DIAGNOSIS — Z803 Family history of malignant neoplasm of breast: Secondary | ICD-10-CM | POA: Insufficient documentation

## 2022-09-26 DIAGNOSIS — F1020 Alcohol dependence, uncomplicated: Secondary | ICD-10-CM

## 2022-09-26 DIAGNOSIS — Z800 Family history of malignant neoplasm of digestive organs: Secondary | ICD-10-CM

## 2022-09-26 DIAGNOSIS — Z1211 Encounter for screening for malignant neoplasm of colon: Secondary | ICD-10-CM

## 2022-09-26 DIAGNOSIS — Z8249 Family history of ischemic heart disease and other diseases of the circulatory system: Secondary | ICD-10-CM

## 2022-09-26 DIAGNOSIS — Z1231 Encounter for screening mammogram for malignant neoplasm of breast: Secondary | ICD-10-CM

## 2022-09-26 DIAGNOSIS — Z13220 Encounter for screening for lipoid disorders: Secondary | ICD-10-CM

## 2022-09-26 LAB — COMPREHENSIVE METABOLIC PANEL
ALT: 56 U/L — ABNORMAL HIGH (ref 0–35)
AST: 54 U/L — ABNORMAL HIGH (ref 0–37)
Albumin: 4.3 g/dL (ref 3.5–5.2)
Alkaline Phosphatase: 80 U/L (ref 39–117)
BUN: 12 mg/dL (ref 6–23)
CO2: 23 mEq/L (ref 19–32)
Calcium: 9.4 mg/dL (ref 8.4–10.5)
Chloride: 103 mEq/L (ref 96–112)
Creatinine, Ser: 0.89 mg/dL (ref 0.40–1.20)
GFR: 76.95 mL/min (ref >60.00)
Glucose, Bld: 73 mg/dL (ref 70–99)
Potassium: 3.5 mEq/L (ref 3.5–5.1)
Sodium: 141 mEq/L (ref 135–145)
Total Bilirubin: 0.4 mg/dL (ref 0.2–1.2)
Total Protein: 6.7 g/dL (ref 6.0–8.3)

## 2022-09-26 LAB — CBC WITH DIFFERENTIAL/PLATELET
Basophils Absolute: 0 10*3/uL (ref 0.0–0.1)
Basophils Relative: 0.5 % (ref 0.0–3.0)
Eosinophils Absolute: 0.1 10*3/uL (ref 0.0–0.7)
Eosinophils Relative: 1.4 % (ref 0.0–5.0)
HCT: 46.2 % — ABNORMAL HIGH (ref 36.0–46.0)
Hemoglobin: 15.1 g/dL — ABNORMAL HIGH (ref 12.0–15.0)
Lymphocytes Relative: 43.3 % (ref 12.0–46.0)
Lymphs Abs: 2.2 10*3/uL (ref 0.7–4.0)
MCHC: 32.7 g/dL (ref 30.0–36.0)
MCV: 95 fl (ref 78.0–100.0)
Monocytes Absolute: 0.4 10*3/uL (ref 0.1–1.0)
Monocytes Relative: 8.4 % (ref 3.0–12.0)
Neutro Abs: 2.4 10*3/uL (ref 1.4–7.7)
Neutrophils Relative %: 46.4 % (ref 43.0–77.0)
Platelets: 143 10*3/uL — ABNORMAL LOW (ref 150.0–400.0)
RBC: 4.86 Mil/uL (ref 3.87–5.11)
RDW: 13.3 % (ref 11.5–15.5)
WBC: 5.1 10*3/uL (ref 4.0–10.5)

## 2022-09-26 LAB — VITAMIN B12: Vitamin B-12: 417 pg/mL (ref 211–911)

## 2022-09-26 LAB — LIPID PANEL
Cholesterol: 188 mg/dL (ref 0–200)
HDL: 121.3 mg/dL (ref >39.00)
LDL Cholesterol: 49 mg/dL (ref 0–99)
NonHDL: 66.23
Total CHOL/HDL Ratio: 2
Triglycerides: 85 mg/dL (ref 0.0–149.0)
VLDL: 17 mg/dL (ref 0.0–40.0)

## 2022-09-26 LAB — HEMOGLOBIN A1C: Hgb A1c MFr Bld: 5.1 % (ref 4.6–6.5)

## 2022-09-26 LAB — TSH: TSH: 5.16 u[IU]/mL (ref 0.35–5.50)

## 2022-09-26 LAB — VITAMIN D 25 HYDROXY (VIT D DEFICIENCY, FRACTURES): VITD: 34.15 ng/mL (ref 30.00–100.00)

## 2022-09-26 LAB — MAGNESIUM: Magnesium: 1.9 mg/dL (ref 1.5–2.5)

## 2022-09-26 LAB — T4, FREE: Free T4: 1.41 ng/dL (ref 0.60–1.60)

## 2022-09-26 MED ORDER — BUPROPION HCL ER (XL) 300 MG PO TB24: 300.0000 mg | ORAL_TABLET | Freq: Every day | ORAL | 1 refills | Status: DC

## 2022-09-26 MED ORDER — AMLODIPINE BESYLATE 2.5 MG PO TABS: 2.5000 mg | ORAL_TABLET | Freq: Two times a day (BID) | ORAL | 1 refills | Status: DC

## 2022-09-26 NOTE — Patient Instructions (Addendum)
Return in about 24 weeks (around 03/13/2023) for Routine chronic condition follow-up.        Great to see you today.  I have refilled the medication(s) we provide.   If labs were collected, we will inform you of lab results once received either by echart message or telephone call.   - echart message- for normal results that have been seen by the patient already.   - telephone call: abnormal results or if patient has not viewed results in their echart.

## 2022-09-26 NOTE — Progress Notes (Signed)
Patient ID: Jennifer Mayo, female  DOB: 1976/02/05, 47 y.o.   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  female present for cpe and Chronic Conditions/illness Management  All past medical history, surgical history, allergies, family history, immunizations, medications and social history were updated in the electronic medical record today. All recent labs, ED visits and hospitalizations within the last year were reviewed.   Health maintenance:  Colon cancer screening: History of colon cancer and MGM (later in life).  Colon cancer screening ordered today by Cologuard Mammogram: History of breast cancer in mother at age 25.  Mammogram ordered last visit, patient reports she is getting it scheduled. Cervical cancer screening: Patient is not a candidate, total hysterectomy history Immunizations: Tdap up-to-date 01/2022, influenza up-to-date 02/2022.  Encouraged Shingrix series after 50 Infectious disease screening: HIV completed.  Hep C-ordered today DEXA: Routine screening at 59  Depression: Last visit approximately 4 weeks ago we restarted Wellbutrin 150 mg daily.  Today patient reports she feels it is working, but she feels she could use more coverage. Prior note: She reports she has a history of depression and has been prescribed fluoxetine and Wellbutrin 300 mg daily in the past.  She states this combination seemed to had worked well for her in the past, however the fluoxetine  did cause sexual side effects that were undesired.  Patient reports she has been out of this medicine for some time but would like to restart the medication today if possible.  Hypertension: Patient reports compliance with start of amlodipine 2.5 mg daily 4 weeks ago.  She increased the dose to twice daily because she continued to have elevated pressures with a headache.  She is tolerating medicine without side effects.  Blood pressures ranges at home not routinely checked. Patient denies chest pain, shortness of breath, dizziness or lower extremity edema.       09/26/2022    8:11 AM 08/26/2022    1:13 PM 09/10/2019    8:49 AM 06/05/2016    5:51 PM 02/22/2016    4:10 PM  Depression screen PHQ 2/9  Decreased Interest 2 0 2 0 0  Down, Depressed, Hopeless 2 1  0 0  PHQ - 2 Score 4 1 2  0 0  Altered sleeping 3 3     Tired, decreased energy 3 3     Change in appetite 1 2     Feeling bad or failure about yourself  1 2     Trouble concentrating 3 3     Moving slowly or fidgety/restless 0 0     Suicidal thoughts 0 0     PHQ-9 Score 15 14     Difficult doing work/chores Somewhat difficult Somewhat difficult         09/26/2022    8:11 AM 08/26/2022    1:24 PM  GAD 7 : Generalized Anxiety Score  Nervous, Anxious, on Edge  2 2  Control/stop worrying 2 1  Worry too much - different things 1 1  Trouble relaxing 0 1  Restless 0 1  Easily annoyed or irritable 1 1  Afraid - awful might happen 2 1  Total GAD 7 Score 8 8  Anxiety Difficulty Somewhat difficult Somewhat difficult          09/26/2022    8:10 AM 08/26/2022    1:11 PM 03/05/2019   10:23 AM 10/30/2018   10:05 AM 06/26/2018   10:31 AM  Fall Risk   Falls in the past year? 1 0 0 1 0  Number falls in past yr: 0 0 0 1 0  Injury with Fall? 0 0 0 1 0  Risk for fall due to : No Fall Risks      Follow up Falls evaluation completed Falls evaluation completed       Immunization History  Administered Date(s) Administered   Influenza,inj,Quad  PF,6+ Mos 02/01/2015   Influenza-Unspecified 01/02/2017, 02/20/2019   PFIZER(Purple Top)SARS-COV-2 Vaccination 06/04/2019, 06/26/2019   Tdap 01/20/2022    No results found.  Past Medical History:  Diagnosis Date   Adjustment disorder with mixed anxiety and depressed mood 09/19/2020   Adrenal insufficiency (HCC) 10/23/2016   Anemia    Cardiac microvascular disease    CHF (congestive heart failure) (HCC)    Chicken pox    Depression    Endometrial cancer (HCC) 2015   Migraine    Hematologic and ophthalmic.   Mild diastolic dysfunction    Narcolepsy    Obstructive sleep apnea    4 years ago retested and no longer has sleep apenea   Pleural effusion, bilateral    3-4 years    Solitary pulmonary nodule on lung CT    follow up CT recommended per medical records   Thyroid disease    Vitamin B 12 deficiency    Allergies  Allergen Reactions   Ranexa [Ranolazine] Shortness Of Breath   Nsaids     Gastric bypass - ulcer risk   Topamax [Topiramate]    Verapamil Other (See Comments)    hypotension   Past Surgical History:  Procedure Laterality Date   ABDOMINAL HYSTERECTOMY  2015   CHOLECYSTECTOMY     LAPAROSCOPIC ROUX-EN-Y GASTRIC BYPASS WITH HIATAL HERNIA REPAIR N/A 07/01/2016   Procedure: LAPAROSCOPIC ROUX-EN-Y GASTRIC BYPASS WITH HIATAL HERNIA REPAIR, UPPER ENDO;  Surgeon: De Blanch Kinsinger, MD;  Location: [facility];  Service: General;  Laterality: N/A;   UPPER GI ENDOSCOPY  07/01/2016   Procedure: UPPER GI ENDOSCOPY;  Surgeon: De Blanch Kinsinger, MD;  Location: [facility];  Service: General;;   Family History  Problem Relation Age of Onset   Brain cancer Mother    Seizures Mother    Stroke Mother    Breast cancer Mother 88   Diabetes Father    Hypertension Father    Hyperlipidemia Father    Hypertension Brother    Hypertension Maternal Grandmother    Colon cancer Maternal Grandmother    Atrial fibrillation Maternal Grandmother    Diabetes Paternal Grandmother     Hypertension Paternal Grandfather    Heart disease Paternal Grandfather    Social History   Social History Narrative   Pt is R handed   Lives in 3 story home with her husband and 2 (sometimes 3) children   Has 3 children   Masters Degree x2 = Business Administration, NP   Practicing Nurse Practitioner    Caffeine: 2 c of coffee  a day     -smoke alarm in the home:Yes     - wears seatbelt: Yes     - Feels safe in their relationships: Yes       Allergies as of 09/26/2022       Reactions   Ranexa [ranolazine] Shortness Of Breath   Nsaids    Gastric bypass - ulcer risk   Topamax [topiramate]    Verapamil Other (See Comments)   hypotension        Medication List        Accurate as of September 26, 2022  8:44 AM. If you have any questions, ask your nurse or doctor.          Aimovig 140 MG/ML Soaj Generic drug: Erenumab-aooe Inject into the skin.   amLODipine 2.5 MG tablet Commonly known as: NORVASC Take 1 tablet (2.5 mg total) by mouth 2 (two) times daily.   BARIATRIC MULTIVITAMINS/IRON PO Take 1 tablet by mouth 2 (two) times daily.   buPROPion 300 MG 24 hr tablet Commonly known as: Wellbutrin XL Take 1 tablet (300 mg total) by mouth daily. What changed:  medication strength how much to take Changed by: Felix Pacini, DO   CALCIUM 1000 + D PO Take 1 tablet by mouth 3 (three) times daily.        All past medical history, surgical history, allergies, family history, immunizations andmedications were updated in the EMR today and reviewed under the history and medication portions of their EMR.    No results found for this or any previous visit (from the past 2160 hour(s)).  No results found.   ROS 14 pt review of systems performed and negative (unless mentioned in an HPI)  Objective: BP 123/77   Pulse 83   Temp 98.1 F (36.7 C)   Ht 5\' 5"  (1.651 m)   Wt 173 lb 12.8 oz (78.8 kg)   SpO2 98%   BMI 28.92 kg/m  Physical Exam Vitals and nursing note  reviewed.  Constitutional:      General: She is not in acute distress.    Appearance: Normal appearance. She is not ill-appearing or toxic-appearing.  HENT:     Head: Normocephalic and atraumatic.     Right Ear: Tympanic membrane, ear canal and external ear normal. There is no impacted cerumen.     Left Ear: Tympanic membrane, ear canal and external ear normal. There is no impacted cerumen.     Nose: No congestion or rhinorrhea.     Mouth/Throat:     Mouth: Mucous membranes are moist.     Pharynx: Oropharynx is clear. No oropharyngeal exudate or posterior oropharyngeal erythema.  Eyes:     General: No scleral icterus.       Right eye: No discharge.        Left eye: No discharge.     Extraocular Movements: Extraocular movements intact.     Conjunctiva/sclera: Conjunctivae normal.     Pupils: Pupils are equal, round, and reactive to light.  Cardiovascular:     Rate and Rhythm: Normal rate and regular rhythm.     Pulses: Normal pulses.     Heart sounds: Murmur heard.     No friction rub. No gallop.  Pulmonary:     Effort: Pulmonary effort is normal. No respiratory distress.     Breath sounds: Normal breath sounds. No stridor. No wheezing, rhonchi or rales.  Chest:     Chest wall: No tenderness.  Abdominal:     General:  Abdomen is flat. Bowel sounds are normal. There is no distension.     Palpations: Abdomen is soft. There is no mass.     Tenderness: There is no abdominal tenderness. There is no right CVA tenderness, left CVA tenderness, guarding or rebound.     Hernia: No hernia is present.  Musculoskeletal:        General: No swelling, tenderness or deformity. Normal range of motion.     Cervical back: Normal range of motion and neck supple. No rigidity or tenderness.     Right lower leg: No edema.     Left lower leg: No edema.  Lymphadenopathy:     Cervical: No cervical adenopathy.  Skin:    General: Skin is warm and dry.     Coloration: Skin is not jaundiced or pale.      Findings: No bruising, erythema, lesion or rash.  Neurological:     General: No focal deficit present.     Mental Status: She is alert and oriented to person, place, and time. Mental status is at baseline.     Cranial Nerves: No cranial nerve deficit.     Sensory: No sensory deficit.     Motor: No weakness.     Coordination: Coordination normal.     Gait: Gait normal.     Deep Tendon Reflexes: Reflexes normal.  Psychiatric:        Mood and Affect: Mood normal.        Behavior: Behavior normal.        Thought Content: Thought content normal.        Judgment: Judgment normal.     Assessment/plan: Jennifer Mayo is a 47 y.o. female preseHypertension/mild diastolic dysfunction/Cardiac microvascular disease/family history of heart disease and strokent for  HTN/Murmur, cardiac/dyspnea/ Much better and now normal.  Increased to Amlodipine 2.5 mg BID (started this dose Monday).- new script provided.  Low-sodium diet and routine exercise encouraged. Mild dyspnea reported today.  Heart murmur is appreciated.   not been reported - ECHOCARDIOGRAM COMPLETE ordered last visit-scheduled for 10/10/2022 CBC, CMP, TSH, lipids and A1c collected today  Depression: Improving, could still use more coverage Increase Wellbutrin 150 mg daily> 300 mg daily.  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 Patient was encouraged to choose a diet filled with fresh fruits  and vegetables, and lean meats. AVS provided to patient today for education/recommendation on gender specific health and safety maintenance. Colon cancer screening: History of colon cancer and MGM (later in life).  Colon cancer screening ordered today by Cologuard Mammogram: History of breast cancer in mother at age 2.  Mammogram ordered last visit, patient reports she is getting it scheduled. Cervical cancer screening: Patient is not a candidate, total hysterectomy history Immunizations: Tdap up-to-date 01/2022, influenza up-to-date 02/2022.  Encouraged Shingrix series after 50 Infectious disease screening: HIV completed.  Hep C-ordered today DEXA: Routine screening at 59  Return in about 24 weeks (around 03/13/2023) for Routine chronic condition follow-up.  Orders Placed This Encounter  Procedures   CBC with Differential/Platelet   Comprehensive metabolic panel   Hemoglobin A1c   TSH   Lipid panel   Hepatitis C Antibody   Cologuard   T4, free   B12   Vitamin D (25 hydroxy)   Magnesium   Meds ordered this encounter  Medications   amLODipine (NORVASC) 2.5 MG tablet    Sig: Take 1 tablet (2.5 mg total) by mouth 2 (two) times daily.    Dispense:  180 tablet    Refill:  1   buPROPion (WELLBUTRIN XL) 300 MG 24 hr tablet    Sig: Take 1 tablet (300 mg total) by mouth daily.    Dispense:  90 tablet    Refill:  1   Referral Orders  No referral(s) requested today     Note is dictated utilizing voice recognition software. Although note has been proof read prior to signing, occasional typographical errors still can be missed. If any questions arise, please do not hesitate to call for verification.  Electronically signed by: Felix Pacini, DO [facility]

## 2022-09-27 ENCOUNTER — Encounter: Payer: Self-pay | Admitting: Family Medicine

## 2022-09-27 ENCOUNTER — Telehealth: Payer: Self-pay | Admitting: Family Medicine

## 2022-09-27 DIAGNOSIS — R011 Cardiac murmur, unspecified: Secondary | ICD-10-CM | POA: Insufficient documentation

## 2022-09-27 LAB — HEPATITIS C ANTIBODY: Hepatitis C Ab: NONREACTIVE

## 2022-09-27 NOTE — Telephone Encounter (Signed)
Please call patient: Vitamin D levels in cholesterol panel is at goal A1c is normal at 5.1 Thyroid function is in normal range at 5.1. B12 is on the lower end of normal at 417, she would benefit from starting sublingual 1000 mg B12 daily.   Magnesium levels are 1.9, she would likely benefit from starting a magnesium supplement nightly (Slow-Mag recommended 1-2 tabs nightly)   Her potassium is borderline, I would recommend she increase the potassium in her diet and take a daily multivitamin. Example: Bananas, oranges, cantaloupe, grapefruit ,prunes, raisins, and dates,Cooked spinach., Cooked broccoli.,Potatoes.,Sweet potatoes,Mushrooms, Peas,Cucumbers   Her liver enzymes, hemoglobin and hematocrit are elevated.  Her platelets are just mildly below normal. -I would like her to make a follow-up appointment within the next 2-4 weeks to discuss these abnormalities and to start further testing on cause.  Please have her schedule this as soon as possible.

## 2022-09-27 NOTE — Telephone Encounter (Signed)
Left vm for pt to return my call. Results and recommendations also sent in mychart.

## 2022-10-10 ENCOUNTER — Ambulatory Visit: Payer: BC Managed Care – PPO | Attending: Family Medicine

## 2022-10-10 DIAGNOSIS — R011 Cardiac murmur, unspecified: Secondary | ICD-10-CM | POA: Diagnosis not present

## 2022-10-10 DIAGNOSIS — R0600 Dyspnea, unspecified: Secondary | ICD-10-CM | POA: Diagnosis not present

## 2022-10-10 LAB — ECHOCARDIOGRAM COMPLETE
Area-P 1/2: 4.79 cm2
S' Lateral: 2.3 cm

## 2022-10-19 ENCOUNTER — Other Ambulatory Visit (HOSPITAL_COMMUNITY): Payer: Self-pay

## 2022-10-22 ENCOUNTER — Encounter: Payer: Self-pay | Admitting: Family Medicine

## 2022-10-28 ENCOUNTER — Other Ambulatory Visit (HOSPITAL_COMMUNITY): Payer: BC Managed Care – PPO

## 2022-10-30 ENCOUNTER — Encounter: Payer: Self-pay | Admitting: Neurology

## 2022-11-04 DIAGNOSIS — Z1211 Encounter for screening for malignant neoplasm of colon: Secondary | ICD-10-CM | POA: Diagnosis not present

## 2022-11-11 LAB — COLOGUARD
COLOGUARD: NEGATIVE
Cologuard: NEGATIVE

## 2022-11-30 ENCOUNTER — Emergency Department (HOSPITAL_BASED_OUTPATIENT_CLINIC_OR_DEPARTMENT_OTHER): Payer: BC Managed Care – PPO

## 2022-11-30 ENCOUNTER — Emergency Department (HOSPITAL_BASED_OUTPATIENT_CLINIC_OR_DEPARTMENT_OTHER)
Admission: EM | Admit: 2022-11-30 | Discharge: 2022-11-30 | Disposition: A | Discharge status: Home / Self Care | Payer: BC Managed Care – PPO | Attending: Emergency Medicine | Admitting: Emergency Medicine

## 2022-11-30 ENCOUNTER — Encounter (HOSPITAL_BASED_OUTPATIENT_CLINIC_OR_DEPARTMENT_OTHER): Payer: Self-pay | Admitting: Emergency Medicine

## 2022-11-30 ENCOUNTER — Other Ambulatory Visit: Payer: Self-pay

## 2022-11-30 DIAGNOSIS — F10120 Alcohol abuse with intoxication, uncomplicated: Secondary | ICD-10-CM | POA: Diagnosis not present

## 2022-11-30 DIAGNOSIS — I10 Essential (primary) hypertension: Secondary | ICD-10-CM | POA: Diagnosis not present

## 2022-11-30 DIAGNOSIS — F10920 Alcohol use, unspecified with intoxication, uncomplicated: Secondary | ICD-10-CM

## 2022-11-30 DIAGNOSIS — R000 Tachycardia, unspecified: Secondary | ICD-10-CM | POA: Diagnosis not present

## 2022-11-30 DIAGNOSIS — Y908 Blood alcohol level of 240 mg/100 ml or more: Secondary | ICD-10-CM | POA: Insufficient documentation

## 2022-11-30 DIAGNOSIS — R270 Ataxia, unspecified: Secondary | ICD-10-CM

## 2022-11-30 DIAGNOSIS — R29818 Other symptoms and signs involving the nervous system: Secondary | ICD-10-CM | POA: Diagnosis not present

## 2022-11-30 DIAGNOSIS — R260 Ataxic gait: Secondary | ICD-10-CM | POA: Insufficient documentation

## 2022-11-30 DIAGNOSIS — F10129 Alcohol abuse with intoxication, unspecified: Secondary | ICD-10-CM | POA: Diagnosis not present

## 2022-11-30 LAB — RAPID URINE DRUG SCREEN, HOSP PERFORMED
Amphetamines: NOT DETECTED
Barbiturates: NOT DETECTED
Benzodiazepines: NOT DETECTED
Cocaine: NOT DETECTED
Opiates: NOT DETECTED
Tetrahydrocannabinol: NOT DETECTED

## 2022-11-30 LAB — DIFFERENTIAL
Abs Immature Granulocytes: 0.01 10*3/uL (ref 0.00–0.07)
Basophils Absolute: 0 10*3/uL (ref 0.0–0.1)
Basophils Relative: 1 %
Eosinophils Absolute: 0.1 10*3/uL (ref 0.0–0.5)
Eosinophils Relative: 2 %
Immature Granulocytes: 0 %
Lymphocytes Relative: 48 %
Lymphs Abs: 1.8 10*3/uL (ref 0.7–4.0)
Monocytes Absolute: 0.3 10*3/uL (ref 0.1–1.0)
Monocytes Relative: 7 %
Neutro Abs: 1.6 10*3/uL — ABNORMAL LOW (ref 1.7–7.7)
Neutrophils Relative %: 42 %

## 2022-11-30 LAB — CBC
HCT: 42.2 % (ref 36.0–46.0)
Hemoglobin: 14.1 g/dL (ref 12.0–15.0)
MCH: 31.6 pg (ref 26.0–34.0)
MCHC: 33.4 g/dL (ref 30.0–36.0)
MCV: 94.6 fL (ref 80.0–100.0)
Platelets: 135 10*3/uL — ABNORMAL LOW (ref 150–400)
RBC: 4.46 MIL/uL (ref 3.87–5.11)
RDW: 13.1 % (ref 11.5–15.5)
WBC: 3.8 10*3/uL — ABNORMAL LOW (ref 4.0–10.5)
nRBC: 0 % (ref 0.0–0.2)

## 2022-11-30 LAB — COMPREHENSIVE METABOLIC PANEL
ALT: 57 U/L — ABNORMAL HIGH (ref 0–44)
AST: 73 U/L — ABNORMAL HIGH (ref 15–41)
Albumin: 3.7 g/dL (ref 3.5–5.0)
Alkaline Phosphatase: 93 U/L (ref 38–126)
Anion gap: 13 (ref 5–15)
BUN: 10 mg/dL (ref 6–20)
CO2: 24 mmol/L (ref 22–32)
Calcium: 8 mg/dL — ABNORMAL LOW (ref 8.9–10.3)
Chloride: 106 mmol/L (ref 98–111)
Creatinine, Ser: 0.79 mg/dL (ref 0.44–1.00)
GFR, Estimated: >60 mL/min (ref >60)
Glucose, Bld: 99 mg/dL (ref 70–99)
Potassium: 3.8 mmol/L (ref 3.5–5.1)
Sodium: 143 mmol/L (ref 135–145)
Total Bilirubin: 0.1 mg/dL — ABNORMAL LOW (ref 0.3–1.2)
Total Protein: 6.5 g/dL (ref 6.5–8.1)

## 2022-11-30 LAB — ETHANOL: Alcohol, Ethyl (B): 265 mg/dL — ABNORMAL HIGH (ref <10)

## 2022-11-30 MED ORDER — SODIUM CHLORIDE 0.9 % IV BOLUS
1000.0000 mL | Freq: Once | INTRAVENOUS | Status: AC
  Administered 2022-11-30: 1000 mL via INTRAVENOUS

## 2022-11-30 MED ORDER — IOHEXOL 350 MG/ML SOLN
75.0000 mL | Freq: Once | INTRAVENOUS | Status: AC | PRN
  Administered 2022-11-30: 75 mL via INTRAVENOUS

## 2022-11-30 NOTE — ED Notes (Signed)
EDP notified of pt presenting with possible stroke sx.

## 2022-11-30 NOTE — ED Provider Notes (Signed)
[facility] Provider Note   CSN: [id number] Arrival date & time: 11/30/22  1903     History  Chief Complaint  Patient presents with   Gait Problem    Jennifer Mayo is a 47 y.o. female history of depression, here presenting with trouble walking.  Patient states that she has history of hemiplegic migraines.  She states that she has been having some slurred speech that is intermittent.  Patient also has some shakiness.  Patient states that she also has wide-based gait.  Patient does admit to drinking alcohol.  Patient was seen by neurology multiple times and had multiple CTs and MRIs but never had a stroke in the past or multiple sclerosis.  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  The history is provided by the patient.       Home Medications Prior to Admission medications   Medication Sig Start Date End Date Taking? Authorizing Provider  AIMOVIG 140 MG/ML SOAJ Inject into the skin. 09/24/22   [provider]  amLODipine (NORVASC) 2.5 MG tablet Take 1 tablet (2.5 mg total) by mouth 2 (two) times daily. 09/26/22   Kuneff, Renee A, DO  buPROPion (WELLBUTRIN XL) 300 MG 24 hr tablet Take 1 tablet (300 mg total) by mouth daily. 09/26/22   Kuneff, Renee A, DO  Calcium Carb-Cholecalciferol (CALCIUM 1000 + D PO) Take 1 tablet by mouth 3 (three) times daily.    [provider]  Multiple Vitamins-Minerals (BARIATRIC MULTIVITAMINS/IRON PO) Take 1 tablet by mouth 2 (two) times daily.    [provider]      Allergies    Ranexa [ranolazine], Nsaids, Topamax [topiramate], and Verapamil    Review of Systems   Review of Systems  Neurological:  Positive for dizziness and speech difficulty.  All other systems reviewed and are negative.   Physical Exam Updated Vital Signs BP (!) 129/95 (BP Location: Right Arm)   Pulse (!) 109   Temp 98.1 F (36.7 C) (Oral)   Resp 16    SpO2 98%  Physical Exam Vitals and nursing note reviewed.  Constitutional:      Comments: Smells of alcohol  HENT:     Head: Normocephalic.     Nose: Nose normal.     Mouth/Throat:     Mouth: Mucous membranes are moist.  Eyes:     Extraocular Movements: Extraocular movements intact.     Pupils: Pupils are equal, round, and reactive to light.  Cardiovascular:     Rate and Rhythm: Regular rhythm. Tachycardia present.     Pulses: Normal pulses.     Heart sounds: Normal heart sounds.  Pulmonary:     Effort: Pulmonary effort is normal.     Breath sounds: Normal breath sounds.  Abdominal:     General: Abdomen is flat.     Palpations: Abdomen is soft.  Musculoskeletal:        General: Normal range of motion.     Cervical back: Normal range of motion and neck supple.  Skin:    General: Skin is warm.     Capillary Refill: Capillary refill takes less than 2 seconds.  Neurological:     Mental Status: She is alert.     Comments: Patient has some resting tremors.  Patient has no obvious facial droop.  Patient has normal finger-to-nose bilaterally.  Patient does have a wide-based gait but is able to walk by herself.  Psychiatric:        Mood and Affect: Mood normal.        Behavior: Behavior normal.     ED Results / Procedures / Treatments   Labs (all labs ordered are listed, but only abnormal results are displayed) Labs Reviewed  CBC - Abnormal; Notable for the following components:      Result Value   WBC 3.8 (*)    Platelets 135 (*)    All other components within normal limits  DIFFERENTIAL - Abnormal; Notable for the following components:   Neutro Abs 1.6 (*)    All other components within normal limits  COMPREHENSIVE METABOLIC PANEL - Abnormal; Notable for the following components:   Calcium 8.0 (*)    AST 73 (*)    ALT 57 (*)    Total Bilirubin 0.1 (*)    All other components within normal limits  ETHANOL - Abnormal; Notable for the following components:   Alcohol,  Ethyl (B) 265 (*)    All other components within normal limits  RAPID URINE DRUG SCREEN, HOSP PERFORMED    EKG EKG Interpretation Date/Time:  Saturday November 30 2022 19:42:45 EDT Ventricular Rate:  82 PR Interval:  132 QRS Duration:  104 QT Interval:  396 QTC Calculation: 463 R Axis:   7  Text Interpretation: Sinus rhythm Abnormal R-wave progression, early transition Probable left ventricular hypertrophy No significant change since last tracing Confirmed by Richardean Canal [phone]) on 11/30/2022 8:44:32 PM  Radiology No results found.  Procedures Procedures    Medications Ordered in ED Medications  iohexol (OMNIPAQUE) 350 MG/ML injection 75 mL (has no administration in time range)    ED Course/ Medical Decision Making/ A&P                                 Medical Decision Making Jennifer Mayo is a 48 y.o. female here presenting with trouble walking and intermittent slurred speech.  She states that symptoms have been going on for several weeks now.  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  However she had seen neurology multiple times and had multiple MRIs back in 2019 and 2020 that never showed a stroke or multiple sclerosis.  Patient also smells of alcohol.  Consider alcohol intoxication versus stroke versus hemiplegic migraines.  Plan to get CBC and CMP and CTA head and neck.  Patient does not have a clear timeline so we will hold off on calling code stroke  9:57 PM I reviewed patient's labs and independently interpreted CTA.  Patient alcohol level is 265.  Chemistry and CBC otherwise unremarkable.  CTA is normal.  I think her slurred speech and trouble walking likely is from alcohol intoxication.  Patient states that she did drink some alcohol yesterday.  Based on previous records, patient does drink alcohol chronically.  I encouraged her to stop drinking alcohol.  She has neurology follow-up already and I  encouraged her to move up her appointment with Dr. Assunta Found.   Problems Addressed: Alcoholic intoxication without complication (HCC): acute illness or injury Ataxia: acute illness or injury  Amount and/or Complexity of Data Reviewed Labs: ordered. Decision-making details documented in ED Course. Radiology: ordered and independent interpretation performed. Decision-making details documented in ED Course.  Risk Prescription drug management.    Final Clinical Impression(s) / ED Diagnoses Final diagnoses:  None  Rx / DC Orders ED Discharge Orders     None         Charlynne Pander, MD 11/30/22 2158

## 2022-11-30 NOTE — ED Notes (Signed)
Pt taken to CT with Trinna Post, RN

## 2022-11-30 NOTE — ED Triage Notes (Addendum)
Patient with gait disturbances.  She states that she that it started around 1130 this morning.  She does have history of migraines.  Patient does smell of ETOH.  Patient has equal hand grips and no slurred speech, no facial droop.  Patient with shakiness.

## 2022-11-30 NOTE — Discharge Instructions (Addendum)
As we discussed, your CTA is normal today.  However your alcohol level is 265.  I encourage you to stop drinking alcohol.  Please call Dr. Moises Blood office and follow-up with him in the next few weeks  Return to ER if you have worse trouble speaking or weakness or trouble walking

## 2022-11-30 NOTE — ED Notes (Signed)
Dr Silverio Lay in triage for exam.

## 2023-01-23 ENCOUNTER — Encounter (HOSPITAL_COMMUNITY): Payer: Self-pay | Admitting: *Deleted

## 2023-02-20 NOTE — Progress Notes (Deleted)
NEUROLOGY CONSULTATION NOTE  Jennifer Mayo MRN: [id number] DOB: March 13, 1976  Referring provider: Felix Pacini, DO Primary care provider: Felix Pacini, DO  Reason for consult:  hemiplegic migraine  Assessment/Plan:   Hemiplegic migraine Migraine with aura, without status migrainosus, not intractable.   Migraine prevention:  *** Migraine rescue:  *** Limit use of pain relievers to no more than 2 days out of week to prevent risk of rebound or medication-overuse headache. Keep headache diary Follow up ***    Subjective:  Jennifer Mayo is a 47 year old right-handed Caucasian woman with CHF, microvascular coronary artery disease, obstructive sleep apnea, narcolepsy, status post bariatric surgery and history of endometrial cancer whom I previously saw for hemiplegic migraine presents to re-establish care for hemiplegic migraines.  History supplemented by ED,referring provider and my previous notes.  The patient was admitted to [facility] on 11/19/17 after presenting with right sided weakness involving the face, hand and leg.  NIH stroke scale was 3 due to mild right sided weakness of face, upper and lower extremities.  She received tPA.  There was no associated headache, unilateral numbness, aphasia, or visual disturbance.  MRI of brain personally reviewed and negative for acute stroke.  CTA of head and neck personally reviewed and demonstrated no emergent or significant large vessel occlusion or stenosis.  2D echo showed EF 55-60% with no cardiac source of embolus,  Due to recent prolonged car ride, she had lower extremity venous doppler performed, which was negative for DVT.  LDL was 72 and Hgb A1c 5.2.  HIV testing was negative.  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  Since then, she has a mild residual right sided heaviness, unsteadiness and memory and word-finding issues.  Since hospitalization, she has had a constant left sided  pressure-like to sharp headache that fluctuates in intensity from mild to severe.  It becomes severe 5-10 minutes about 3 times a day.  No associated symptoms such as nausea, photophobia, phonophobia, visual disturbance or worsening numbness or weakness.  She was subsequently taken off of ASA due to consideration that the event was migraine. MRI of brain with and without contrast was performed as a follow up which was personally reviewed and was normal.    Around January 2020, she developed mild left sided weakness of arm and leg for 15 minutes.  On her own, she restarted ASA every other day.  Due to mild residual symptoms, she had a repeat MRI of brain with and without contrast on 08/07/18 which was personally reviewed and was normal.    She presented to the [facility] ED on 08/27/18 as a stroke code for sudden onset dizziness, headache and now LEFT sided weakness.  MRI and MRA of head personally reviewed were negative.  Symptoms lasted into next day   She returned to the [facility] ED on 10/22/18 for sudden onset onset of weakness of all of her extremities, but primarily left sided, dizziness/lightheadedness (not spinning), tremors and difficulty speaking.  She also had a mild headache/heaviness in the back of her head. Symptoms improved while in ED and resolved by next day.   Other spells: -  her right arm and hand felt weak with difficulty writing, lasting 60-90 minutes -  Episodes of possible aura, in which she had trouble speaking and right hand heaviness.  Seen in the ED on 11/30/2022 for intermittent slurred speech, shakiness and unsteady gait.     She denies prior history of  migraines.  In hindsight, she had an event in 1999 in which she developed a visual aura described as a crayon scribble that slowly grows to cover visual field of both eyes, lasting 30 minutes.  It occurred a couple of years later, lasting 45 minutes.  Both events were not associated with headache.  CTA head and neck personally  reviewed was normal.  She drinks alcohol chronically.  EtOH level was 265.  It was believed that her unsteady gait and slurred speech may be due to her alcohol use.     Past NSAIDS/analgesics:  *** Past abortive triptans:  contraindicated Past abortive ergotamine:  contraindicated Past muscle relaxants:  *** Past anti-emetic:  promethazine Past antihypertensive medications:  beta blocker (hypotension), HCTZ Past antidepressant medications:  amitriptyline (side effects), citalopram, fluoxetine Past anticonvulsant medications:  topiramate (side effects) Past anti-CGRP:  Bernita Raisin 100mg  (effective) Past vitamins/Herbal/Supplements:  *** Past antihistamines/decongestants:  *** Other past therapies:  ***  Current NSAIDS/analgesics:  *** Current triptans:  none Current ergotamine:  none Current anti-emetic:  none Current muscle relaxants:  none Current Antihypertensive medications:  amlodipine Current Antidepressant medications:  Wellbutrin Current Anticonvulsant medications:  none Current anti-CGRP:  Aimovig 140mg  every 4 weeks Other therapy:  *** Birth control:  *** Other medications:  ***   Caffeine:  *** Alcohol:  *** Smoker:  *** Diet:  *** Exercise:  *** Depression:  ***; Anxiety:  *** Other pain:  *** Sleep hygiene:  *** Family history of headache:  ***    PAST MEDICAL HISTORY: Past Medical History:  Diagnosis Date   Adjustment disorder with mixed anxiety and depressed mood 09/19/2020   Adrenal insufficiency (HCC) 10/23/2016   Anemia    Cardiac microvascular disease    CHF (congestive heart failure) (HCC)    Chicken pox    Depression    Endometrial cancer (HCC) 2015   Endometrial adenocarcinoma EIN   Migraine    Hematologic and ophthalmic.   Mild diastolic dysfunction    Narcolepsy    Obstructive sleep apnea    4 years ago retested and no longer has sleep apenea   Pleural effusion, bilateral    3-4 years    Solitary pulmonary nodule on lung CT    follow  up CT recommended per medical records   Thyroid disease    Vitamin B 12 deficiency     PAST SURGICAL HISTORY: Past Surgical History:  Procedure Laterality Date   ABDOMINAL HYSTERECTOMY  2015   CHOLECYSTECTOMY     LAPAROSCOPIC ROUX-EN-Y GASTRIC BYPASS WITH HIATAL HERNIA REPAIR N/A 07/01/2016   Procedure: LAPAROSCOPIC ROUX-EN-Y GASTRIC BYPASS WITH HIATAL HERNIA REPAIR, UPPER ENDO;  Surgeon: De Blanch Kinsinger, MD;  Location: [facility];  Service: General;  Laterality: N/A;   UPPER GI ENDOSCOPY  07/01/2016   Procedure: UPPER GI ENDOSCOPY;  Surgeon: De Blanch Kinsinger, MD;  Location: [facility];  Service: General;;    MEDICATIONS: Current Outpatient Medications on File Prior to Visit  Medication Sig Dispense Refill   AIMOVIG 140 MG/ML SOAJ Inject into the skin.     amLODipine (NORVASC) 2.5 MG tablet Take 1 tablet (2.5 mg total) by mouth 2 (two) times daily. 180 tablet 1   buPROPion (WELLBUTRIN XL) 300 MG 24 hr tablet Take 1 tablet (300 mg total) by mouth daily. 90 tablet 1   Calcium Carb-Cholecalciferol (CALCIUM 1000 + D PO) Take 1 tablet by mouth 3 (three) times daily.     Multiple Vitamins-Minerals (BARIATRIC MULTIVITAMINS/IRON PO) Take 1 tablet by mouth 2 (two)  times daily.     No current facility-administered medications on file prior to visit.    ALLERGIES: Allergies  Allergen Reactions   Ranexa [Ranolazine] Shortness Of Breath   Nsaids     Gastric bypass - ulcer risk   Topamax [Topiramate]    Verapamil Other (See Comments)    hypotension    FAMILY HISTORY: Family History  Problem Relation Age of Onset   Brain cancer Mother    Seizures Mother    Stroke Mother    Breast cancer Mother 31   Diabetes Father    Hypertension Father    Hyperlipidemia Father    Hypertension Brother    Hypertension Maternal Grandmother    Colon cancer Maternal Grandmother    Atrial fibrillation Maternal Grandmother    Diabetes Paternal Grandmother    Hypertension Paternal Grandfather     Heart disease Paternal Grandfather     Objective:  *** General: No acute distress.  Patient appears well-groomed.   Head:  Normocephalic/atraumatic Eyes:  fundi examined but not visualized Neck: supple, no paraspinal tenderness, full range of motion Back: No paraspinal tenderness Heart: regular rate and rhythm Lungs: Clear to auscultation bilaterally. Vascular: No carotid bruits. Neurological Exam: Mental status: alert and oriented to person, place, and time, speech fluent and not dysarthric, language intact. Cranial nerves: CN I: not tested CN II: pupils equal, round and reactive to light, visual fields intact CN III, IV, VI:  full range of motion, no nystagmus, no ptosis CN V: facial sensation intact. CN VII: upper and lower face symmetric CN VIII: hearing intact CN IX, X: gag intact, uvula midline CN XI: sternocleidomastoid and trapezius muscles intact CN XII: tongue midline Bulk & Tone: normal, no fasciculations. Motor:  muscle strength 5/5 throughout Sensation:  Pinprick, temperature and vibratory sensation intact. Deep Tendon Reflexes:  2+ throughout,  toes downgoing.   Finger to nose testing:  Without dysmetria.   Heel to shin:  Without dysmetria.   Gait:  Normal station and stride.  Romberg negative.    Thank you for allowing me to take part in the care of this patient.  Shon Millet, DO  CC: ***

## 2023-02-24 ENCOUNTER — Ambulatory Visit: Payer: BC Managed Care – PPO | Admitting: Neurology

## 2023-03-06 ENCOUNTER — Telehealth: Payer: Self-pay

## 2023-03-06 DIAGNOSIS — Z23 Encounter for immunization: Secondary | ICD-10-CM | POA: Diagnosis not present

## 2023-03-06 NOTE — Telephone Encounter (Signed)
Called to reschedule 11/21 appt

## 2023-03-13 ENCOUNTER — Ambulatory Visit: Payer: BC Managed Care – PPO | Admitting: Family Medicine

## 2023-04-11 ENCOUNTER — Telehealth: Payer: Self-pay

## 2023-04-11 NOTE — Telephone Encounter (Signed)
Pt needs appt

## 2023-04-11 NOTE — Telephone Encounter (Signed)
Pt overdue for OV  

## 2023-06-30 DIAGNOSIS — S92035A Nondisplaced avulsion fracture of tuberosity of left calcaneus, initial encounter for closed fracture: Secondary | ICD-10-CM | POA: Diagnosis not present

## 2023-07-04 DIAGNOSIS — M79672 Pain in left foot: Secondary | ICD-10-CM | POA: Diagnosis not present

## 2023-11-26 ENCOUNTER — Other Ambulatory Visit: Payer: Self-pay

## 2024-02-12 ENCOUNTER — Ambulatory Visit (HOSPITAL_COMMUNITY): Payer: Self-pay | Admitting: Psychiatry

## 2024-02-12 ENCOUNTER — Encounter (HOSPITAL_COMMUNITY): Payer: Self-pay | Admitting: Psychiatry

## 2024-02-12 DIAGNOSIS — F1020 Alcohol dependence, uncomplicated: Secondary | ICD-10-CM | POA: Diagnosis not present

## 2024-02-12 DIAGNOSIS — F411 Generalized anxiety disorder: Secondary | ICD-10-CM

## 2024-02-12 DIAGNOSIS — Z9884 Bariatric surgery status: Secondary | ICD-10-CM

## 2024-02-12 DIAGNOSIS — F332 Major depressive disorder, recurrent severe without psychotic features: Secondary | ICD-10-CM

## 2024-02-12 DIAGNOSIS — F410 Panic disorder [episodic paroxysmal anxiety] without agoraphobia: Secondary | ICD-10-CM

## 2024-02-12 MED ORDER — FLUOXETINE HCL 10 MG PO CAPS: 10.0000 mg | ORAL_CAPSULE | Freq: Every day | ORAL | 0 refills | Status: DC
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
## 2024-02-16 ENCOUNTER — Ambulatory Visit (HOSPITAL_COMMUNITY): Admission: EM | Admit: 2024-02-16 | Discharge: 2024-02-16 | Disposition: A | Discharge status: Home / Self Care

## 2024-02-16 DIAGNOSIS — F410 Panic disorder [episodic paroxysmal anxiety] without agoraphobia: Secondary | ICD-10-CM

## 2024-02-16 DIAGNOSIS — F32A Depression, unspecified: Secondary | ICD-10-CM | POA: Diagnosis not present

## 2024-02-16 DIAGNOSIS — F1010 Alcohol abuse, uncomplicated: Secondary | ICD-10-CM

## 2024-02-16 DIAGNOSIS — R4589 Other symptoms and signs involving emotional state: Secondary | ICD-10-CM | POA: Diagnosis not present

## 2024-02-16 DIAGNOSIS — F419 Anxiety disorder, unspecified: Secondary | ICD-10-CM | POA: Diagnosis not present

## 2024-02-16 NOTE — Discharge Instructions (Addendum)
  Discharge recommendations:  Patient is to take medications as prescribed. Increase Prozac  to 20 mg PO daily for Panic disorder Please follow up with your primary care provider for all medical related needs.   Therapy: We recommend that patient participate in individual therapy to address mental health concerns.  Medications: The patient or guardian is to contact a medical professional and/or outpatient provider to address any new side effects that develop. The patient or guardian should update outpatient providers of any new medications and/or medication changes.   Safety:  The patient should abstain from use of illicit substances/drugs and abuse of any medications. If symptoms worsen or do not continue to improve or if the patient becomes actively suicidal or homicidal then it is recommended that the patient return to the closest hospital emergency department, the [facility], or call 911 for further evaluation and treatment. National Suicide Prevention Lifeline 1-800-SUICIDE or 331-563-5542.  About 988 988 offers 24/7 access to trained crisis counselors who can help people experiencing mental health-related distress. People can call or text 988 or chat 988lifeline.org for themselves or if they are worried about a loved one who may need crisis support.  Crisis Mobile: [facility]:                     (907) 556-0010 (for crisis response 24 hours a day) [facility] Hotline:                                            [phone]

## 2024-02-16 NOTE — Progress Notes (Signed)
   02/16/24 1857  [facility] Triage Screening (Walk-ins at [facility] only)  What Is the Reason for Your Visit/Call Today? Enedelia presents to [facility] voluntarily accopanied by Husband. Pt states that she has been having a lot more depression, anxiety and panic attacks which has led her to drink more. Pt states she wants to get out of this cycle. She was prescribed 10 mg prozac  last week and started on thursday. Pt states she is seeing a psychiatrist. Today she is seeking an as needed medication for the panic attacks. Pt denies SI, HI, AVH, Drug use.  How Long Has This Been Causing You Problems? 1 wk - 1 month  Have You Recently Had Any Thoughts About Hurting Yourself? No  Are You Planning to Commit Suicide/Harm Yourself At This time? No  Have you Recently Had Thoughts About Hurting Someone Sherral? No  Are You Planning To Harm Someone At This Time? No  Are you currently experiencing any auditory, visual or other hallucinations? No  Have You Used Any Alcohol or Drugs in the Past 24 Hours? Yes  What Did You Use and How Much? 2 large glasses of wine last night  Do you have any current medical co-morbidities that require immediate attention? No  Clinician description of patient physical appearance/behavior: neat, cooperative  What Do You Feel Would Help You the Most Today? Alcohol or Drug Use Treatment;Treatment for Depression or other mood problem;Stress Management  Determination of Need Routine (7 days)  Options For Referral Intensive Outpatient Therapy;Medication Management;Outpatient Therapy
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
## 2024-02-18 ENCOUNTER — Other Ambulatory Visit (INDEPENDENT_AMBULATORY_CARE_PROVIDER_SITE_OTHER)
Admission: EM | Admit: 2024-02-18 | Discharge: 2024-02-18 | Disposition: A | Discharge status: Home / Self Care | Source: Home / Self Care

## 2024-02-18 ENCOUNTER — Other Ambulatory Visit: Payer: Self-pay

## 2024-02-18 ENCOUNTER — Emergency Department (HOSPITAL_COMMUNITY)

## 2024-02-18 ENCOUNTER — Encounter (HOSPITAL_COMMUNITY): Payer: Self-pay

## 2024-02-18 ENCOUNTER — Other Ambulatory Visit (HOSPITAL_COMMUNITY)
Admission: EM | Admit: 2024-02-18 | Discharge: 2024-02-18 | Disposition: A | Discharge status: Other Acute Inpatient Hospital | Source: Intra-hospital | Attending: Family | Admitting: Family

## 2024-02-18 ENCOUNTER — Emergency Department (HOSPITAL_COMMUNITY)
Admission: EM | Admit: 2024-02-18 | Discharge: 2024-02-18 | Disposition: A | Discharge status: Other Acute Inpatient Hospital | Attending: Emergency Medicine | Admitting: Emergency Medicine

## 2024-02-18 DIAGNOSIS — F32A Depression, unspecified: Secondary | ICD-10-CM | POA: Insufficient documentation

## 2024-02-18 DIAGNOSIS — F10930 Alcohol use, unspecified with withdrawal, uncomplicated: Secondary | ICD-10-CM

## 2024-02-18 DIAGNOSIS — F1020 Alcohol dependence, uncomplicated: Secondary | ICD-10-CM | POA: Diagnosis not present

## 2024-02-18 DIAGNOSIS — S92909D Unspecified fracture of unspecified foot, subsequent encounter for fracture with routine healing: Secondary | ICD-10-CM | POA: Insufficient documentation

## 2024-02-18 DIAGNOSIS — I509 Heart failure, unspecified: Secondary | ICD-10-CM | POA: Diagnosis not present

## 2024-02-18 DIAGNOSIS — F909 Attention-deficit hyperactivity disorder, unspecified type: Secondary | ICD-10-CM | POA: Insufficient documentation

## 2024-02-18 DIAGNOSIS — F411 Generalized anxiety disorder: Secondary | ICD-10-CM | POA: Insufficient documentation

## 2024-02-18 DIAGNOSIS — I110 Hypertensive heart disease with heart failure: Secondary | ICD-10-CM | POA: Insufficient documentation

## 2024-02-18 DIAGNOSIS — F1090 Alcohol use, unspecified, uncomplicated: Secondary | ICD-10-CM | POA: Diagnosis present

## 2024-02-18 DIAGNOSIS — F330 Major depressive disorder, recurrent, mild: Secondary | ICD-10-CM | POA: Insufficient documentation

## 2024-02-18 DIAGNOSIS — R2681 Unsteadiness on feet: Secondary | ICD-10-CM | POA: Insufficient documentation

## 2024-02-18 DIAGNOSIS — X58XXXD Exposure to other specified factors, subsequent encounter: Secondary | ICD-10-CM | POA: Insufficient documentation

## 2024-02-18 DIAGNOSIS — F10230 Alcohol dependence with withdrawal, uncomplicated: Secondary | ICD-10-CM | POA: Insufficient documentation

## 2024-02-18 DIAGNOSIS — Y906 Blood alcohol level of 120-199 mg/100 ml: Secondary | ICD-10-CM | POA: Insufficient documentation

## 2024-02-18 DIAGNOSIS — Z79899 Other long term (current) drug therapy: Secondary | ICD-10-CM | POA: Insufficient documentation

## 2024-02-18 DIAGNOSIS — F4323 Adjustment disorder with mixed anxiety and depressed mood: Secondary | ICD-10-CM | POA: Insufficient documentation

## 2024-02-18 DIAGNOSIS — G4710 Hypersomnia, unspecified: Secondary | ICD-10-CM | POA: Insufficient documentation

## 2024-02-18 DIAGNOSIS — F4310 Post-traumatic stress disorder, unspecified: Secondary | ICD-10-CM | POA: Insufficient documentation

## 2024-02-18 DIAGNOSIS — F410 Panic disorder [episodic paroxysmal anxiety] without agoraphobia: Secondary | ICD-10-CM | POA: Insufficient documentation

## 2024-02-18 DIAGNOSIS — F10939 Alcohol use, unspecified with withdrawal, unspecified: Secondary | ICD-10-CM | POA: Diagnosis not present

## 2024-02-18 DIAGNOSIS — G629 Polyneuropathy, unspecified: Secondary | ICD-10-CM | POA: Insufficient documentation

## 2024-02-18 DIAGNOSIS — R110 Nausea: Secondary | ICD-10-CM | POA: Diagnosis present

## 2024-02-18 LAB — TSH: TSH: 2.357 u[IU]/mL (ref 0.350–4.500)

## 2024-02-18 LAB — CBC WITH DIFFERENTIAL/PLATELET
Abs Immature Granulocytes: 0.01 K/uL (ref 0.00–0.07)
Basophils Absolute: 0 K/uL (ref 0.0–0.1)
Basophils Relative: 1 %
Eosinophils Absolute: 0 K/uL (ref 0.0–0.5)
Eosinophils Relative: 0 %
HCT: 41.8 % (ref 36.0–46.0)
Hemoglobin: 13.8 g/dL (ref 12.0–15.0)
Immature Granulocytes: 0 %
Lymphocytes Relative: 29 %
Lymphs Abs: 0.7 K/uL (ref 0.7–4.0)
MCH: 31.1 pg (ref 26.0–34.0)
MCHC: 33 g/dL (ref 30.0–36.0)
MCV: 94.1 fL (ref 80.0–100.0)
Monocytes Absolute: 0.3 K/uL (ref 0.1–1.0)
Monocytes Relative: 11 %
Neutro Abs: 1.4 K/uL — ABNORMAL LOW (ref 1.7–7.7)
Neutrophils Relative %: 59 %
Platelets: 118 K/uL — ABNORMAL LOW (ref 150–400)
RBC: 4.44 MIL/uL (ref 3.87–5.11)
RDW: 14.2 % (ref 11.5–15.5)
WBC: 2.4 K/uL — ABNORMAL LOW (ref 4.0–10.5)
nRBC: 0 % (ref 0.0–0.2)

## 2024-02-18 LAB — COMPREHENSIVE METABOLIC PANEL WITH GFR
ALT: 66 U/L — ABNORMAL HIGH (ref 0–44)
AST: 62 U/L — ABNORMAL HIGH (ref 15–41)
Albumin: 3.6 g/dL (ref 3.5–5.0)
Alkaline Phosphatase: 92 U/L (ref 38–126)
Anion gap: 17 — ABNORMAL HIGH (ref 5–15)
BUN: 7 mg/dL (ref 6–20)
CO2: 26 mmol/L (ref 22–32)
Calcium: 8.8 mg/dL — ABNORMAL LOW (ref 8.9–10.3)
Chloride: 100 mmol/L (ref 98–111)
Creatinine, Ser: 0.56 mg/dL (ref 0.44–1.00)
GFR, Estimated: >60 mL/min (ref >60)
Glucose, Bld: 88 mg/dL (ref 70–99)
Potassium: 4.2 mmol/L (ref 3.5–5.1)
Sodium: 143 mmol/L (ref 135–145)
Total Bilirubin: 0.5 mg/dL (ref 0.0–1.2)
Total Protein: 6.4 g/dL — ABNORMAL LOW (ref 6.5–8.1)

## 2024-02-18 LAB — POCT PREGNANCY, URINE: Preg Test, Ur: NEGATIVE

## 2024-02-18 LAB — POCT URINE DRUG SCREEN - MANUAL ENTRY (I-SCREEN)
POC Amphetamine UR: NOT DETECTED
POC Buprenorphine (BUP): NOT DETECTED
POC Cocaine UR: NOT DETECTED
POC Marijuana UR: NOT DETECTED
POC Methadone UR: NOT DETECTED
POC Methamphetamine UR: NOT DETECTED
POC Morphine: NOT DETECTED
POC Oxazepam (BZO): NOT DETECTED
POC Oxycodone UR: NOT DETECTED
POC Secobarbital (BAR): NOT DETECTED

## 2024-02-18 LAB — ETHANOL: Alcohol, Ethyl (B): 153 mg/dL — ABNORMAL HIGH (ref <15)

## 2024-02-18 LAB — MAGNESIUM: Magnesium: 2.1 mg/dL (ref 1.7–2.4)

## 2024-02-18 LAB — POC URINE PREG, ED: Preg Test, Ur: NEGATIVE

## 2024-02-18 MED ORDER — LACTATED RINGERS IV BOLUS
2000.0000 mL | Freq: Once | INTRAVENOUS | Status: AC
  Administered 2024-02-18: 2000 mL via INTRAVENOUS

## 2024-02-18 MED ORDER — LORAZEPAM 2 MG/ML IJ SOLN: 2.0000 mg | Freq: Three times a day (TID) | INTRAMUSCULAR | Status: DC | PRN

## 2024-02-18 MED ORDER — LORAZEPAM 1 MG PO TABS
2.0000 mg | ORAL_TABLET | Freq: Once | ORAL | Status: AC
  Administered 2024-02-18: 2 mg via ORAL
  Filled 2024-02-18: qty 2

## 2024-02-18 MED ORDER — FOLIC ACID 1 MG PO TABS
1.0000 mg | ORAL_TABLET | Freq: Every day | ORAL | Status: DC
  Administered 2024-02-18: 1 mg via ORAL
  Filled 2024-02-18: qty 1

## 2024-02-18 MED ORDER — LOPERAMIDE HCL 2 MG PO CAPS: 2.0000 mg | ORAL_CAPSULE | ORAL | Status: DC | PRN

## 2024-02-18 MED ORDER — HALOPERIDOL 5 MG PO TABS: 5.0000 mg | ORAL_TABLET | Freq: Three times a day (TID) | ORAL | Status: DC | PRN

## 2024-02-18 MED ORDER — THIAMINE MONONITRATE 100 MG PO TABS
100.0000 mg | ORAL_TABLET | Freq: Every day | ORAL | Status: DC
  Filled 2024-02-18: qty 1

## 2024-02-18 MED ORDER — HALOPERIDOL LACTATE 5 MG/ML IJ SOLN: 5.0000 mg | Freq: Three times a day (TID) | INTRAMUSCULAR | Status: DC | PRN

## 2024-02-18 MED ORDER — LORAZEPAM 1 MG PO TABS: 1.0000 mg | ORAL_TABLET | Freq: Two times a day (BID) | ORAL | Status: DC

## 2024-02-18 MED ORDER — THIAMINE HCL 100 MG/ML IJ SOLN
100.0000 mg | Freq: Once | INTRAMUSCULAR | Status: AC
  Administered 2024-02-18: 100 mg via INTRAMUSCULAR
  Filled 2024-02-18: qty 2

## 2024-02-18 MED ORDER — LORAZEPAM 1 MG PO TABS: 1.0000 mg | ORAL_TABLET | Freq: Four times a day (QID) | ORAL | Status: DC | PRN

## 2024-02-18 MED ORDER — OYSTER SHELL CALCIUM/D3 500-5 MG-MCG PO TABS
1.0000 | ORAL_TABLET | Freq: Three times a day (TID) | ORAL | Status: DC
  Administered 2024-02-18: 1 via ORAL
  Filled 2024-02-18: qty 1

## 2024-02-18 MED ORDER — THIAMINE HCL 100 MG/ML IJ SOLN: 100.0000 mg | Freq: Once | INTRAMUSCULAR | Status: DC

## 2024-02-18 MED ORDER — HALOPERIDOL LACTATE 5 MG/ML IJ SOLN: 10.0000 mg | Freq: Three times a day (TID) | INTRAMUSCULAR | Status: DC | PRN

## 2024-02-18 MED ORDER — LORAZEPAM 1 MG PO TABS
1.0000 mg | ORAL_TABLET | ORAL | Status: DC | PRN
  Administered 2024-02-18: 1 mg via ORAL
  Filled 2024-02-18: qty 1

## 2024-02-18 MED ORDER — AMLODIPINE BESYLATE 5 MG PO TABS: 5.0000 mg | ORAL_TABLET | Freq: Every day | ORAL | Status: DC

## 2024-02-18 MED ORDER — THIAMINE MONONITRATE 100 MG PO TABS: 100.0000 mg | ORAL_TABLET | Freq: Every day | ORAL | Status: DC

## 2024-02-18 MED ORDER — ADULT MULTIVITAMIN W/MINERALS CH
1.0000 | ORAL_TABLET | Freq: Every day | ORAL | Status: DC
  Filled 2024-02-18: qty 1

## 2024-02-18 MED ORDER — HAIR SKIN & NAILS PO TABS: ORAL_TABLET | Freq: Every day | ORAL | Status: DC

## 2024-02-18 MED ORDER — DIPHENHYDRAMINE HCL 50 MG PO CAPS: 50.0000 mg | ORAL_CAPSULE | Freq: Three times a day (TID) | ORAL | Status: DC | PRN

## 2024-02-18 MED ORDER — ADULT MULTIVITAMIN W/MINERALS CH: 1.0000 | ORAL_TABLET | Freq: Every day | ORAL | Status: DC

## 2024-02-18 MED ORDER — AMLODIPINE BESYLATE 2.5 MG PO TABS: 2.5000 mg | ORAL_TABLET | Freq: Two times a day (BID) | ORAL | Status: DC

## 2024-02-18 MED ORDER — HYDROXYZINE HCL 25 MG PO TABS: 25.0000 mg | ORAL_TABLET | Freq: Three times a day (TID) | ORAL | Status: DC | PRN

## 2024-02-18 MED ORDER — LORAZEPAM 2 MG/ML IJ SOLN
1.0000 mg | Freq: Once | INTRAMUSCULAR | Status: AC
  Administered 2024-02-18: 1 mg via INTRAVENOUS
  Filled 2024-02-18: qty 1

## 2024-02-18 MED ORDER — TRAZODONE HCL 50 MG PO TABS: 50.0000 mg | ORAL_TABLET | Freq: Every evening | ORAL | Status: DC | PRN

## 2024-02-18 MED ORDER — ALUM & MAG HYDROXIDE-SIMETH 200-200-20 MG/5ML PO SUSP: 30.0000 mL | ORAL | Status: DC | PRN

## 2024-02-18 MED ORDER — LORAZEPAM 1 MG PO TABS: 1.0000 mg | ORAL_TABLET | Freq: Every day | ORAL | Status: DC

## 2024-02-18 MED ORDER — DIPHENHYDRAMINE HCL 50 MG/ML IJ SOLN: 50.0000 mg | Freq: Three times a day (TID) | INTRAMUSCULAR | Status: DC | PRN

## 2024-02-18 MED ORDER — OVER THE COUNTER MEDICATION: 1.0000 | Freq: Three times a day (TID) | Status: DC

## 2024-02-18 MED ORDER — LORAZEPAM 1 MG PO TABS
1.0000 mg | ORAL_TABLET | Freq: Four times a day (QID) | ORAL | Status: DC
  Administered 2024-02-18: 1 mg via ORAL
  Filled 2024-02-18: qty 1

## 2024-02-18 MED ORDER — ONDANSETRON 4 MG PO TBDP: 4.0000 mg | ORAL_TABLET | Freq: Four times a day (QID) | ORAL | Status: DC | PRN

## 2024-02-18 MED ORDER — THIAMINE HCL 100 MG/ML IJ SOLN: 100.0000 mg | Freq: Every day | INTRAMUSCULAR | Status: DC

## 2024-02-18 MED ORDER — LORAZEPAM 1 MG PO TABS: 1.0000 mg | ORAL_TABLET | Freq: Three times a day (TID) | ORAL | Status: DC

## 2024-02-18 MED ORDER — MAGNESIUM HYDROXIDE 400 MG/5ML PO SUSP: 30.0000 mL | Freq: Every day | ORAL | Status: DC | PRN

## 2024-02-18 MED ORDER — FLUOXETINE HCL 20 MG PO CAPS: 20.0000 mg | ORAL_CAPSULE | Freq: Every day | ORAL | Status: DC

## 2024-02-18 MED ORDER — ONDANSETRON 4 MG PO TBDP
4.0000 mg | ORAL_TABLET | Freq: Four times a day (QID) | ORAL | Status: DC | PRN
  Administered 2024-02-18: 4 mg via ORAL
  Filled 2024-02-18: qty 1

## 2024-02-18 MED ORDER — METOPROLOL TARTRATE 5 MG/5ML IV SOLN
2.5000 mg | Freq: Once | INTRAVENOUS | Status: AC
Start: 2024-02-18 — End: 2024-02-18
  Administered 2024-02-18: 2.5 mg via INTRAVENOUS
  Filled 2024-02-18: qty 5

## 2024-02-18 MED ORDER — ADULT MULTIVITAMIN W/MINERALS CH
1.0000 | ORAL_TABLET | Freq: Every day | ORAL | Status: DC
  Administered 2024-02-18: 1 via ORAL

## 2024-02-18 MED ORDER — ONDANSETRON HCL 4 MG/2ML IJ SOLN
4.0000 mg | Freq: Once | INTRAMUSCULAR | Status: DC
  Filled 2024-02-18: qty 2

## 2024-02-18 MED ORDER — LORAZEPAM 2 MG/ML IJ SOLN
1.0000 mg | INTRAMUSCULAR | Status: DC | PRN
  Administered 2024-02-18: 1 mg via INTRAVENOUS
  Filled 2024-02-18: qty 1

## 2024-02-18 MED ORDER — ACETAMINOPHEN 325 MG PO TABS: 650.0000 mg | ORAL_TABLET | Freq: Four times a day (QID) | ORAL | Status: DC | PRN

## 2024-02-18 MED ORDER — LORAZEPAM 2 MG/ML IJ SOLN: 2.0000 mg | Freq: Once | INTRAMUSCULAR | Status: AC

## 2024-02-18 NOTE — ED Notes (Signed)
 Pt off unit by [facility] to [facility]

## 2024-02-18 NOTE — Group Note (Signed)
 Group Topic: Wellness  Group Date: 02/18/2024 Start Time: 1200 End Time: 1230 Facilitators: Daved Tinnie HERO, RN  Department: [facility]  Number of Participants: 7  Group Focus: nursing group Treatment Modality:  Psychoeducation Interventions utilized were patient education Purpose: increase insight  Name: Jennifer Mayo Date of Birth: 1975-11-23  MR: [id number]    Level of Participation: moderate Quality of Participation: cooperative Interactions with others: gave feedback Mood/Affect: appropriate Triggers (if applicable): n/a Cognition: coherent/clear Progress: Gaining insight Response: medications reviewed with pt, questions denied.  Plan: patient will be encouraged to attend RN education groups and discuss medications as desired.   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

## 2024-02-18 NOTE — Progress Notes (Signed)
Pt transferred to [facility]. 

## 2024-02-18 NOTE — ED Notes (Addendum)
 Pt administered stat dose of ativan 2mg  as ordered, RN gave report to [facility] nurse megan. Pt ate lunch while waiting.

## 2024-02-18 NOTE — ED Provider Notes (Signed)
 Patient presents hypertensive, blood pressure measures 170/100.  Tachycardic, pulse rate 120.  Complains of headache. Patient excepted to [facility] emergency department by Dr. Randol for medical clearance.  Patient remains voluntary.  Patient can travel EMS.  Patient is appropriate to return to [facility] behavioral health facility based crisis unit once medically cleared. Medication: -Lorazepam 2 mg p.o. or IM once  Patient reviewed with attending psychiatrist, Dr. Lawrnce.

## 2024-02-18 NOTE — ED Provider Notes (Signed)
 [place] EMERGENCY DEPARTMENT AT [facility] Provider Note   CSN: [id number] Arrival date & time: 02/18/24  1317     Patient presents with: Hypertension and Tachycardia   Jennifer Mayo is a 48 y.o. female.    Hypertension     Patient has a history of thyroid disease, depression CHF, migraines, adrenal sufficiency, alcohol use disorder patient states she drinks alcohol daily.  Patient went to the behavioral health unit today to try to get assistance with her alcohol use.  Patient wants to stop drinking alcohol and needs help.  At the behavioral health unit patient was noted to be tachycardic and hypertension.  They sent her to the ED for medical clearance.  Patient denies any chest pain.  No fevers.  No vomiting.  She does feel tremulous.  Some nausea  Prior to Admission medications   Medication Sig Start Date End Date Taking? Authorizing Provider  AIMOVIG  140 MG/ML SOAJ Inject 140 mg into the skin every 30 (thirty) days. 09/24/22   [provider]  amLODipine  (NORVASC ) 2.5 MG tablet Take 1 tablet (2.5 mg total) by mouth 2 (two) times daily. Patient taking differently: Take 5 mg by mouth at bedtime. 09/26/22   Kuneff, Renee A, DO  FLUoxetine  (PROZAC ) 10 MG capsule Take 1 capsule (10 mg total) by mouth daily. Patient taking differently: Take 20 mg by mouth at bedtime. 02/12/24 02/11/25  Geralene Kaiser, MD  Multiple Vitamins-Minerals (BARIATRIC MULTIVITAMINS/IRON  PO) Take 1 tablet by mouth daily.    [provider]  Multiple Vitamins-Minerals (HAIR SKIN & NAILS PO) Take 2 tablets by mouth daily.    [provider]  ondansetron  (ZOFRAN -ODT) 4 MG disintegrating tablet Take 4-8 mg by mouth every 8 (eight) hours as needed for nausea or vomiting.    [provider]  OVER THE COUNTER MEDICATION Take 1 tablet by mouth in the morning, at noon, and at bedtime. Calcium  with Vitamin D  Chews    [provider]    Allergies: Ranexa [ranolazine],  Nsaids, Verapamil, and Topamax [topiramate]    Review of Systems  Updated Vital Signs BP (!) 140/94   Pulse (!) 119   Temp 99 F (37.2 C) (Oral)   Resp 20   Ht 1.651 m (5' 5)   Wt 76.2 kg   SpO2 99%   BMI 27.96 kg/m   Physical Exam Vitals and nursing note reviewed.  Constitutional:      General: She is not in acute distress.    Appearance: She is well-developed.  HENT:     Head: Normocephalic and atraumatic.     Right Ear: External ear normal.     Left Ear: External ear normal.  Eyes:     General: No scleral icterus.       Right eye: No discharge.        Left eye: No discharge.     Conjunctiva/sclera: Conjunctivae normal.  Neck:     Trachea: No tracheal deviation.  Cardiovascular:     Rate and Rhythm: Regular rhythm. Tachycardia present.  Pulmonary:     Effort: Pulmonary effort is normal. No respiratory distress.     Breath sounds: Normal breath sounds. No stridor. No wheezing or rales.  Abdominal:     General: Bowel sounds are normal. There is no distension.     Palpations: Abdomen is soft.     Tenderness: There is no abdominal tenderness. There is no guarding or rebound.  Musculoskeletal:        General:  No tenderness or deformity.     Cervical back: Neck supple.  Skin:    General: Skin is warm and dry.     Findings: No rash.  Neurological:     General: No focal deficit present.     Mental Status: She is alert.     Cranial Nerves: No cranial nerve deficit, dysarthria or facial asymmetry.     Sensory: No sensory deficit.     Motor: Tremor present. No abnormal muscle tone or seizure activity.     Coordination: Coordination normal.  Psychiatric:        Mood and Affect: Mood normal.     (all labs ordered are listed, but only abnormal results are displayed) Labs Reviewed - No data to display  EKG: None  Radiology: No results found.   Procedures   Medications Ordered in the ED  LORazepam (ATIVAN) tablet 1-4 mg ( Oral See Alternative 02/18/24 1407)     Or  LORazepam (ATIVAN) injection 1-4 mg (1 mg Intravenous Given 02/18/24 1407)  folic acid (FOLVITE) tablet 1 mg (1 mg Oral Given 02/18/24 1352)  ondansetron  (ZOFRAN ) injection 4 mg (4 mg Intravenous Patient Refused/Not Given 02/18/24 1410)  lactated ringers  bolus 2,000 mL (2,000 mLs Intravenous New Bag/Given 02/18/24 1358)    Clinical Course as of 02/18/24 1548  Wed Feb 18, 2024  1336 Labs reviewed from this morning.  Patient has slight decrease in white blood count at 2.4.  Also mild thrombocytopenia.   [JK]  1336 Metabolic panel showed increased LFTs, increased anion gap no acute renal dysfunction [JK]  1337 Pregnancy test and UDS negative.  Alcohol elevated at 153 [JK]  1400 Thiamine and multivitamin were given at the behavioral health unit [JK]    Clinical Course User Index [JK] Randol Simmonds, MD                                 Medical Decision Making Problems Addressed: Alcohol withdrawal syndrome without complication [facility]): acute illness or injury that poses a threat to life or bodily functions  Amount and/or Complexity of Data Reviewed Labs:  Decision-making details documented in ED Course.  Risk OTC drugs. Prescription drug management. Parenteral controlled substances. Drug therapy requiring intensive monitoring for toxicity.   Presentation is consistent with alcohol withdrawal.  Will treat with IV fluids and benzodiazepines.  Plan on reassessment after treatment in the ED to see if she is stable for continued outpatient management will require admission to the hospital for alcohol withdrawal.  Case turned over to Dr. Ruthe     Final diagnoses:  Alcohol withdrawal syndrome without complication [facility])    ED Discharge Orders     None          Randol Simmonds, MD 02/18/24 1548

## 2024-02-18 NOTE — ED Provider Notes (Signed)
 Facility Based Crisis Admission H&P  Date: 02/18/24 Patient Name: Jennifer Mayo MRN: [id number] Chief Complaint: Alcohol Use Disorder  Diagnoses:  Final diagnoses:  Alcohol use disorder, moderate, in controlled environment [facility])    HPI: Jennifer Mayo is a 48 year old female who presents voluntarily to [facility] behavioral health for walk-in assessment.  Patient seen previously at [facility] on 02/16/2024, formulated plan for alcohol use treatment and facility based crisis unit admission on that date.  Patient assessed by this nurse practitioner face-to-face.  Patient is alert and oriented, pleasant and cooperative during assessment.  Patient is seated in observation area, no apparent distress.  Patient's husband, Charlie, remains present during assessment per patient request.  Patient presents with depressed mood, tearful affect.  Patient states I am here because I have to do this.  Patient would like facility based crisis unit admission with alcohol detoxification, plans to discharge home after alcohol detoxification as I need to get back to work.  [name] endorses alcohol use that began 8 years ago.  Alcohol use increased approximately 6 months ago.  Currently drinking average of 1 bottle of wine per day.  Last drink midnight on 02/17/2024.  Patient denies history of alcohol related seizure, denies delirium tremens.  Patient has noticed tremors when trying to stop alcohol use previously.  Patient denies substance use aside from alcohol.  Mental health diagnoses include major depressive disorder, generalized anxiety disorder, PTSD and ADHD.  [facility] is linked with Dr. Geralene for outpatient psychiatry, current medication fluoxetine  20 mg daily/mood.  Fluoxetine  increased 10mg  to 20mg  two days ago. Denies history of inpatient psychiatric hospitalization.  Family mental health hx includes patient's mother diagnoses major depressive disorder.  Patient denies suicidal and homicidal ideation.  She denies  auditory and visual hallucination.  There is no evidence of delusional thought content no indication that patient is responding to internal stimuli.  [name] endorses depressive symptoms including I sleep too much.  Additional symptoms include depressed mood and decreased appetite.  Patient resides in [place] with her husband and 2 adult sons.  She denies access to weapons.  She is employed as a publishing rights manager.  Patient offered support and encouragement.  Reviewed treatment plan to include admission to facility based crisis unit and lorazepam taper.  Reviewed additional medications including hydroxyzine and trazodone offered patient opportunity to ask questions.  Patient verbalizes understanding and agreement with plan.  PHQ 2-9:  [employer] Visit from 02/12/2024 in [facility] Office Visit from 09/26/2022 in [facility] [place] Office Visit from 08/26/2022 in [facility] at [facility]  Thoughts that you would be better off dead, or of hurting yourself in some way Not at all Not at all Not at all  PHQ-9 Total Score 16 15 14     Flowsheet Row ED from 02/18/2024 in [facility] Office Visit from 02/12/2024 in [facility] at [facility] ED from 11/30/2022 in [facility] Emergency Department at [facility]  C-SSRS RISK CATEGORY No Risk No Risk No Risk      Total Time spent with patient: 45 minutes  Musculoskeletal  Strength & Muscle Tone: within normal limits Gait & Station: normal Patient leans: N/A  Psychiatric Specialty Exam  Presentation General Appearance:  Appropriate for Environment; Casual  Eye Contact: Good  Speech: Clear and Coherent; Normal Rate  Speech Volume: Normal  Handedness: Right   Mood and Affect  Mood:  Depressed  Affect: Tearful   Thought Process  Thought  Processes: Coherent; Goal Directed; Linear  Descriptions of Associations:Intact  Orientation:Full (Time, Place and Person)  Thought Content:Logical; WDL    Hallucinations:Hallucinations: None  Ideas of Reference:None  Suicidal Thoughts:Suicidal Thoughts: No  Homicidal Thoughts:Homicidal Thoughts: No   Sensorium  Memory: Immediate Good; Recent Good  Judgment: Intact  Insight: Present   Executive Functions  Concentration: Good  Attention Span: Good  Recall: Good  Fund of Knowledge: Good  Language: Good   Psychomotor Activity  Psychomotor Activity: Psychomotor Activity: Normal   Assets  Assets: Communication Skills; Desire for Improvement; Financial Resources/Insurance; Housing; Leisure Time; Physical Health; Resilience; Social Support; Transportation; Vocational/Educational   Sleep  Sleep: Sleep: Fair   Nutritional Assessment (For OBS and FBC admissions only) Has the patient had a weight loss or gain of 10 pounds or more in the last 3 months?: No Has the patient had a decrease in food intake/or appetite?: No Does the patient have dental problems?: No Does the patient have eating habits or behaviors that may be indicators of an eating disorder including binging or inducing vomiting?: No Has the patient recently lost weight without trying?: 0 Has the patient been eating poorly because of a decreased appetite?: 0 Malnutrition Screening Tool Score: 0    Physical Exam Vitals and nursing note reviewed.  Constitutional:      Appearance: Normal appearance. She is well-developed.  HENT:     Head: Normocephalic and atraumatic.     Nose: Nose normal.  Cardiovascular:     Rate and Rhythm: Normal rate and regular rhythm.  Pulmonary:     Effort: Pulmonary effort is normal.     Breath sounds: Normal breath sounds.  Musculoskeletal:        General: Normal range of motion.     Cervical back: Normal range of motion.  Skin:    General: Skin is warm  and dry.  Neurological:     Mental Status: She is alert and oriented to person, place, and time.  Psychiatric:        Attention and Perception: Attention and perception normal.        Mood and Affect: Mood is depressed. Affect is tearful.        Speech: Speech normal.        Behavior: Behavior normal. Behavior is cooperative.        Thought Content: Thought content normal.        Cognition and Memory: Cognition and memory normal.        Judgment: Judgment normal.    Review of Systems  Constitutional: Negative.   HENT: Negative.    Eyes: Negative.   Respiratory: Negative.    Cardiovascular: Negative.   Gastrointestinal: Negative.   Genitourinary: Negative.   Musculoskeletal: Negative.   Skin: Negative.   Neurological: Negative.   Psychiatric/Behavioral:  Positive for depression and substance abuse.     Blood pressure (!) 156/83, pulse 75, temperature 99 F (37.2 C), temperature source Oral, resp. rate 20, SpO2 99%. There is no height or weight on file to calculate BMI.  Past Psychiatric History: MDD, GAD, PTSD, ADHD  Is the patient at risk to self? No  Has the patient been a risk to self in the past 6 months? No .    Has the patient been a risk to self within the distant past? No   Is the patient a risk to others? No   Has the patient been a risk to others  in the past 6 months? No   Has the patient been a risk to others within the distant past? No   Past Medical History: Gastric bypass surgery, cardiac microvascular disease, vitamin B12 deficiency, narcolepsy, hemiplegic migraine. Family History: Mother: MDD. Social History: Resides with husband and two adult sons, employed, daily alcohol use  Last Labs:  No visits with results within 6 Month(s) from this visit.  Latest known visit with results is:  Admission on 11/30/2022, Discharged on 11/30/2022  Component Date Value Ref Range Status   WBC 11/30/2022 3.8 (L)  4.0 - 10.5 K/uL Final   RBC 11/30/2022 4.46  3.87 - 5.11  MIL/uL Final   Hemoglobin 11/30/2022 14.1  12.0 - 15.0 g/dL Final   HCT 91/89/7975 42.2  36.0 - 46.0 % Final   MCV 11/30/2022 94.6  80.0 - 100.0 fL Final   MCH 11/30/2022 31.6  26.0 - 34.0 pg Final   MCHC 11/30/2022 33.4  30.0 - 36.0 g/dL Final   RDW 91/89/7975 13.1  11.5 - 15.5 % Final   Platelets 11/30/2022 135 (L)  150 - 400 K/uL Final   nRBC 11/30/2022 0.0  0.0 - 0.2 % Final   Performed at [facility], [address] [place], [place] [address]   Neutrophils Relative % 11/30/2022 42  % Final   Neutro Abs 11/30/2022 1.6 (L)  1.7 - 7.7 K/uL Final   Lymphocytes Relative 11/30/2022 48  % Final   Lymphs Abs 11/30/2022 1.8  0.7 - 4.0 K/uL Final   Monocytes Relative 11/30/2022 7  % Final   Monocytes Absolute 11/30/2022 0.3  0.1 - 1.0 K/uL Final   Eosinophils Relative 11/30/2022 2  % Final   Eosinophils Absolute 11/30/2022 0.1  0.0 - 0.5 K/uL Final   Basophils Relative 11/30/2022 1  % Final   Basophils Absolute 11/30/2022 0.0  0.0 - 0.1 K/uL Final   Immature Granulocytes 11/30/2022 0  % Final   Abs Immature Granulocytes 11/30/2022 0.01  0.00 - 0.07 K/uL Final   Performed at [facility], [address] [place], [place] [address]   Sodium 11/30/2022 143  135 - 145 mmol/L Final   Potassium 11/30/2022 3.8  3.5 - 5.1 mmol/L Final   Chloride 11/30/2022 106  98 - 111 mmol/L Final   CO2 11/30/2022 24  22 - 32 mmol/L Final   Glucose, Bld 11/30/2022 99  70 - 99 mg/dL Final   Glucose reference range applies only to samples taken after fasting for at least 8 hours.   BUN 11/30/2022 10  6 - 20 mg/dL Final   Creatinine, Ser 11/30/2022 0.79  0.44 - 1.00 mg/dL Final   Calcium  11/30/2022 8.0 (L)  8.9 - 10.3 mg/dL Final   Total Protein 91/89/7975 6.5  6.5 - 8.1 g/dL Final   Albumin 91/89/7975 3.7  3.5 - 5.0 g/dL Final   AST 91/89/7975 73 (H)  15 - 41 U/L Final   ALT 11/30/2022 57 (H)  0 - 44 U/L Final   Alkaline Phosphatase 11/30/2022 93  38 - 126 U/L Final   Total Bilirubin  11/30/2022 0.1 (L)  0.3 - 1.2 mg/dL Final   GFR, Estimated 11/30/2022 >60  >60 mL/min Final   Comment: (NOTE) Calculated using the CKD-EPI Creatinine Equation (2021)    Anion gap 11/30/2022 13  5 - 15 Final   Performed at [facility], [address] [place], [place] [address]   Alcohol, Ethyl (B) 11/30/2022  265 (H)  <10 mg/dL Final   Comment: (NOTE) Lowest detectable limit for serum alcohol is 10 mg/dL.  For medical purposes only. Performed at [facility], [address] [place], [place] [address]    Opiates 11/30/2022 NONE DETECTED  NONE DETECTED Final   Cocaine 11/30/2022 NONE DETECTED  NONE DETECTED Final   Benzodiazepines 11/30/2022 NONE DETECTED  NONE DETECTED Final   Amphetamines 11/30/2022 NONE DETECTED  NONE DETECTED Final   Tetrahydrocannabinol 11/30/2022 NONE DETECTED  NONE DETECTED Final   Barbiturates 11/30/2022 NONE DETECTED  NONE DETECTED Final   Comment: (NOTE) DRUG SCREEN FOR MEDICAL PURPOSES ONLY.  IF CONFIRMATION IS NEEDED FOR ANY PURPOSE, NOTIFY LAB WITHIN 5 DAYS.  LOWEST DETECTABLE LIMITS FOR URINE DRUG SCREEN Drug Class                     Cutoff (ng/mL) Amphetamine and metabolites    1000 Barbiturate and metabolites    200 Benzodiazepine                 200 Opiates and metabolites        300 Cocaine and metabolites        300 THC                            50 Performed at [facility], [address] [place], [place] [address]     Allergies: Ranexa [ranolazine], Nsaids, Topamax [topiramate], and Verapamil  Medications:  Facility Ordered Medications  Medication   acetaminophen  (TYLENOL ) tablet 650 mg   alum & mag hydroxide-simeth (MAALOX/MYLANTA) 200-200-20 MG/5ML suspension 30 mL   magnesium hydroxide (MILK OF MAGNESIA) suspension 30 mL   haloperidol (HALDOL) tablet 5 mg   And   diphenhydrAMINE  (BENADRYL ) capsule 50 mg   haloperidol lactate (HALDOL) injection 5 mg   And   diphenhydrAMINE  (BENADRYL )  injection 50 mg   And   LORazepam (ATIVAN) injection 2 mg   haloperidol lactate (HALDOL) injection 10 mg   And   diphenhydrAMINE  (BENADRYL ) injection 50 mg   And   LORazepam (ATIVAN) injection 2 mg   hydrOXYzine (ATARAX) tablet 25 mg   traZODone (DESYREL) tablet 50 mg   amLODipine  (NORVASC ) tablet 2.5 mg   FLUoxetine  (PROZAC ) capsule 20 mg   thiamine (VITAMIN B1) injection 100 mg   [START ON 02/19/2024] thiamine (VITAMIN B1) tablet 100 mg   multivitamin with minerals tablet 1 tablet   LORazepam (ATIVAN) tablet 1 mg   loperamide (IMODIUM) capsule 2-4 mg   ondansetron  (ZOFRAN -ODT) disintegrating tablet 4 mg   PTA Medications  Medication Sig   Calcium  Carb-Cholecalciferol (CALCIUM  1000 + D PO) Take 1 tablet by mouth 3 (three) times daily.   Multiple Vitamins-Minerals (BARIATRIC MULTIVITAMINS/IRON  PO) Take 1 tablet by mouth 2 (two) times daily.   amLODipine  (NORVASC ) 2.5 MG tablet Take 1 tablet (2.5 mg total) by mouth 2 (two) times daily.   AIMOVIG  140 MG/ML SOAJ Inject into the skin.   FLUoxetine  (PROZAC ) 10 MG capsule Take 1 capsule (10 mg total) by mouth daily.    Long Term Goals: Improvement in symptoms so as ready for discharge  Short Term Goals: Patient will verbalize feelings in meetings with treatment team members., Patient will attend at least of 50% of the groups daily., Pt will complete the PHQ9 on admission, day 3 and discharge., Patient will participate in completing the [place] Suicide Severity Rating Scale, Patient will  score a low risk of violence for 24 hours prior to discharge, and Patient will take medications as prescribed daily.  Medical Decision Making  Patient remains voluntary.  Patient will be admitted to [facility] behavioral health facility based crisis unit for treatment and stabilization including alcohol detoxification.  Laboratory studies ordered including CBC, CMP, ethanol, magnesium, prolactin and TSH.  Urine pregnancy, urine drug screen ordered.   EKG order initiated.  Current medications: -Acetaminophen  650 mg every 6 as needed/mild pain -Maalox 30 mL oral every 4 as needed/digestion -Hydroxyzine 25 mg 3 times daily as needed/anxiety -Magnesium hydroxide 30 mL daily as needed/mild constipation -Trazodone 50 mg nightly as needed/sleep  Restart home medications including: -Amlodipine  2.5 mg twice daily -Fluoxetine  20 mg daily/mood   Agitation protocol: MILD -Haloperidol 5 mg 3 times daily as needed mild agitation  -Diphenhydramine  50 mg p.o. 3 times daily as needed mild agitation  MODERATE -Haloperidol 5 mg IM 3 times daily as needed/moderate agitation -Diphenhydramine  50 mg IM 3 times daily as needed/moderate agitation -Lorazepam 2 mg IM 3 times daily as needed/moderate agitation  SEVERE -Haloperidol 10 mg IM 3 times daily as needed severe agitation -Diphenhydramine  50 mg IM 3 times daily as needed/severe agitation -Lorazepam 2 mg IM 3 times daily as needed/severe agitation  CIWA Ativan protocol initiated: -Loperamide 2 to 4 mg oral as needed/diarrhea or loose stools -Lorazepam 1 mg 4 times daily x4 doses, 1 mg 3 times daily x3 doses, 1 mg 2 times daily x2 doses, 1 mg daily x1 dose -Lorazepam 1 mg every 6 hours as needed CIWA greater than 10 -Multivitamin with minerals 1 tablet daily -Ondansetron  disintegrating tablet 4 mg every 6 as needed/nausea or vomiting -Thiamine injection 100 mg IM once -Thiamine tablet 100 mg daily       Recommendations  Based on my evaluation the patient does not appear to have an emergency medical condition.  Ellouise LITTIE Dawn, FNP 02/18/24  8:38 AM

## 2024-02-18 NOTE — ED Notes (Signed)
 CCM called for tele admit.

## 2024-02-18 NOTE — ED Notes (Signed)
 Medication orders discussed with pt, questions denied. Provider notified about elevated pulse and BP. NP plan to discuss with MD. Pt reports mild headache, says she usually takes amlodipine  5mg  however, increased dose of 10mg  was discussed. Pt reports that last dose of 5 mg was last night around 8pm.

## 2024-02-18 NOTE — ED Provider Notes (Signed)
 Tachycardia has greatly improved following IV fluids and Ativan.  To give her a dose of IV Lopressor as well.  Chest x-ray showed no signs of volume overload or pneumonia.  I have no concern for PE other acute process.  Her CIWA score is 4 and she continues to do well.  I think with continued p.o. nutrition and Ativan and CIWA protocol think should continue to stabilize.  I think she is safe for transfer back to behavioral health team.  Dr. Lawrnce steps patient back in transfer.   Ruthe Cornet, DO 02/18/24 1910

## 2024-02-18 NOTE — Progress Notes (Signed)
   02/18/24 0722  [facility] Triage Screening (Walk-ins at [facility] only)  How Did You Hear About Us ? Family/Friend  What Is the Reason for Your Visit/Call Today? Jennifer Mayo presents to [facility] accompanied by her husband. Pt states that she has been having a lot more depression, anxiety and panic attacks which has led her to drink more. Pt states she wants to get out of this cycle. She was prescribed 10 mg prozac  last week and started on thursday. Pt states she is seeing a psychiatrist. Today she is seeking detox off of alcohol. Pt states that she is drinking alcohol daily (large glasses). Pt states she has been struggling with her alcohol problem for several years. Pt denies SI, HI, AVH, Drug use.  How Long Has This Been Causing You Problems? > than 6 months  Have You Recently Had Any Thoughts About Hurting Yourself? No  Are You Planning to Commit Suicide/Harm Yourself At This time? No  Have you Recently Had Thoughts About Hurting Someone Sherral? No  Are You Planning To Harm Someone At This Time? No  Physical Abuse Denies  Verbal Abuse Denies  Sexual Abuse Denies  Exploitation of patient/patient's resources Denies  Self-Neglect Denies  Possible abuse reported to: Other (Comment)  Are you currently experiencing any auditory, visual or other hallucinations? No  Have You Used Any Alcohol or Drugs in the Past 24 Hours? Yes  What Did You Use and How Much? 2 large glasses of wine  Do you have any current medical co-morbidities that require immediate attention? No  What Do You Feel Would Help You the Most Today? Alcohol or Drug Use Treatment  If access to [facility] was not available, would you have sought care in the Emergency Department? No  Determination of Need Urgent (48 hours)  Options For Referral Facility-Based Crisis  Determination of Need filed? Yes

## 2024-02-18 NOTE — ED Triage Notes (Signed)
 Pt transported to ED from [facility]. Reports she was admitted to [facility] today for alcohol. Staff at [facility] was concerned with elevated BP and HR. BP 160/80 and HR 120 O2 100 on RA, R 14, CBG 140, A&O x4, reports mild headache with a dull feeling 2/10.

## 2024-02-18 NOTE — ED Notes (Addendum)
 Pt oriented to unit, further questions denied. Pt denies si hi and avh. Pt says her main desire is to detox from alcohol. Pt utilizes walker to ambulate, saying that she broke her L foot approx 2-3 months ago, says she is on 'take it easy' status and no longer needs boot, chronic pain level of 1-2. Last fall on Sunday due to alcohol intoxication. Pt reports hx of hemiplegic migraines that may display stroke-like symptoms that occurs approx 2-3 times a year migraines (last occurance 4-5 months ago), also occular migraine (last occurance 3 weeks ago). Pt generally calm and cooperative and polite. Pt also reports hx of gastric bypass in approx 2018, saying that she eats small meals through out the day, being mindful of protein and low sugar. RN spoke with NP r/t possibility of Glucerna supplement drinks.

## 2024-02-18 NOTE — ED Notes (Signed)
Pt given diet coke and turkey sandwich. 

## 2024-02-18 NOTE — BH Assessment (Signed)
 Comprehensive Clinical Assessment (CCA) Note  02/18/2024 Jennifer Mayo [id number]  Disposition: Per Ellouise Dawn, NP, the patient is recommended for admission to the Facility-Based Crisis [facility]) unit for medical detoxification and stabilization. Jennifer Mayo is agreeable to the plan and expresses motivation to begin treatment. The treatment team and patient's husband were informed of the plan, and appropriate handoff to the [facility] unit will be completed.  Chief Complaint: Depression, Anxiety, Alcohol Detox  Visit Diagnosis:  296.32 - Major Depressive Disorder, Recurrent, Moderate 300.02 - Generalized Anxiety Disorder 303.90 - Alcohol Dependence  Jennifer Mayo [facility] to the [facility]) accompanied by her husband, reporting worsening depression, anxiety, and panic attacks, which she states have led to increased alcohol use. She describes feeling trapped in a "vicious cycle" of drinking to manage her mood symptoms, which in turn exacerbates her depression and anxiety. Jennifer Mayo reports that she was recently prescribed Prozac  (fluoxetine ) 10 mg daily by her psychiatrist and began taking it last Thursday. Despite this, she feels her symptoms have continued to worsen, prompting her to seek help for alcohol detoxification today.  She reports daily alcohol consumption, describing the amount as "large glasses," though she is unable to quantify the exact volume. She acknowledges that she has struggled with problematic alcohol use for several years and expresses a strong desire to stop drinking and enter detox. She denies use of other substances and denies any history of withdrawal seizures or delirium tremens. Jennifer Mayo currently denies suicidal ideation, homicidal ideation, or auditory or visual hallucinations. She expresses motivation to recover and a desire to "get out of this cycle."  On mental status examination, Jennifer Mayo appears her stated age and is casually dressed, with appropriate hygiene. She  is alert and oriented to person, place, time, and situation. Her behavior is cooperative and engaged, though she appears somewhat anxious and tearful at times. Speech is normal in rate, tone, and volume. Mood is described as "depressed," and affect is congruent with mood. Thought processes are logical, coherent, and goal-directed, with no evidence of thought disorder. She denies suicidal or homicidal thoughts, and there are no signs of hallucinations or delusional thinking. Insight and judgment are fair to good, as evidenced by her acknowledgment of the problem and motivation to seek detox and treatment. Memory and concentration appear intact during the interview.  Overall, Jennifer Mayo presents with symptoms consistent with major depressive disorder and generalized anxiety disorder, complicated by alcohol use disorder. She is requesting detoxification for alcohol dependence and demonstrates insight into her condition with no acute safety concerns at this time.  CCA Screening, Triage and Referral (STR)  Patient Reported Information How did you hear about us ? Family/Friend  What Is the Reason for Your Visit/Call Today? [name] presents to [facility] accompanied by her husband. Pt states that she has been having a lot more depression, anxiety and panic attacks which has led her to drink more. Pt states she wants to get out of this cycle. She was prescribed 10 mg prozac  last week and started on thursday. Pt states she is seeing a psychiatrist. Today she is seeking detox off of alcohol. Pt states that she is drinking alcohol daily (large glasses). Pt states she has been struggling with her alcohol problem for several years. Pt denies SI, HI, AVH, Drug use.  How Long Has This Been Causing You Problems? > than 6 months  What Do You Feel Would Help You the Most Today? Alcohol or Drug Use Treatment   Have You  Recently Had Any Thoughts About Hurting Yourself? No  Are You Planning to Commit Suicide/Harm Yourself At This  time? No   Flowsheet Row ED from 02/18/2024 in [facility] Most recent reading at 02/18/2024 10:11 AM ED from 02/18/2024 in [facility] Most recent reading at 02/18/2024  7:35 AM Office Visit from 02/12/2024 in [facility] Most recent reading at 02/12/2024 11:16 AM  C-SSRS RISK CATEGORY No Risk No Risk No Risk    Have you Recently Had Thoughts About Hurting Someone [place]? No  Are You Planning to Harm Someone at This Time? No  Explanation: n/a   Have You Used Any Alcohol or Drugs in the Past 24 Hours? Yes  How Long Ago Did You Use Drugs or Alcohol? for the past 3 years (daily use)  What Did You Use and How Much? 2 large glasses of wine   Do You Currently Have a Therapist/Psychiatrist? No  Name of Therapist/Psychiatrist:    Have You Been Recently Discharged From Any Office Practice or Programs? No  Explanation of Discharge From Practice/Program: No data recorded    CCA Screening Triage Referral Assessment Type of Contact: Tele-Assessment  Telemedicine Service Delivery: Telemedicine service delivery: This service was provided via telemedicine using a 2-way, interactive audio and video technology  Is this Initial or Reassessment? Is this Initial or Reassessment?: Initial Assessment  Date Telepsych consult ordered in [facility]:  Date Telepsych consult ordered in [facility]: 02/18/24  Time Telepsych consult ordered in [facility]:    Location of Assessment: [facility] Assessment Services  Provider Location: [facility] Assessment Services   Collateral Involvement: n/a   Does Patient Have a Automotive Engineer Guardian? No  Legal Guardian Contact Information: Denies access to a guardian.  Copy of Legal Guardianship Form: No - copy requested  Legal Guardian Notified of Arrival: -- (n/a)  Legal Guardian Notified of Pending Discharge: -- (n/a)  If Minor and Not Living with Parent(s),  Who has Custody? n/a  Is CPS involved or ever been involved? Never  Is APS involved or ever been involved? Never   Patient Determined To Be At Risk for Harm To Self or Others Based on Review of Patient Reported Information or Presenting Complaint? No  Method: No Plan  Availability of Means: No access or NA  Intent: Vague intent or NA  Notification Required: No need or identified person  Additional Information for Danger to Others Potential: No data recorded Additional Comments for Danger to Others Potential: Patient denies  Are There Guns or Other Weapons in Your Home? No  Types of Guns/Weapons: No access to guns or weapons.  Are These Weapons Safely Secured?                            No  Who Could Verify You Are Able To Have These Secured: n/a  Do You Have any Outstanding Charges, Pending Court Dates, Parole/Probation? No legal issues.  Contacted To Inform of Risk of Harm To Self or Others: Other: Comment (n/a)    Does Patient Present under Involuntary Commitment? No    [place] of Residence: [place]   Patient Currently Receiving the Following Services: Medication Management   Determination of Need: Urgent (48 hours)   Options For Referral: Facility-Based Crisis     CCA Biopsychosocial Patient Reported Schizophrenia/Schizoaffective Diagnosis in Past: No   Strengths: Patient is open and willing to engage in treatment.  States she is ready to make a change.   Mental Health Symptoms Depression:  Change in energy/activity; Hopelessness; None   Duration of Depressive symptoms:    Mania:  None   Anxiety:   None   Psychosis:  None   Duration of Psychotic symptoms:    Trauma:  None   Obsessions:  None   Compulsions:  None   Inattention:  None   Hyperactivity/Impulsivity:  None   Oppositional/Defiant Behaviors:  None   Emotional Irregularity:  None   Other Mood/Personality Symptoms:  Depressed Mood. Patient also shares that she has severe  anxiety.    Mental Status Exam Appearance and self-care  Stature:  Average   Weight:  Average weight   Clothing:  Neat/clean   Grooming:  Normal   Cosmetic use:  Age appropriate   Posture/gait:  Normal   Motor activity:  Not Remarkable   Sensorium  Attention:  Normal   Concentration:  Normal   Orientation:  Time; Situation; Place; Person; Object   Recall/memory:  Normal   Affect and Mood  Affect:  Appropriate   Mood:  Depressed   Relating  Eye contact:  None   Facial expression:  Depressed   Attitude toward examiner:  Cooperative   Thought and Language  Speech flow: Clear and Coherent   Thought content:  Appropriate to Mood and Circumstances   Preoccupation:  None   Hallucinations:  None   Organization:  Coherent   [employer] of Knowledge:  Fair   Intelligence:  Average   Abstraction:  Normal   Judgement:  Normal   Reality Testing:  Adequate   Insight:  Good   Decision Making:  Normal   Social Functioning  Social Maturity:  Isolates   Social Judgement:  Normal   Stress  Stressors:  Work   Coping Ability:  Normal   Skill Deficits:  Decision making   Supports:  Family     Religion: Religion/Spirituality Are You A Religious Person?: Yes What is Your Religious Affiliation?: Ambulance Person How Might This Affect Treatment?: Patient states that she is heavily inolved in her church.  Leisure/Recreation: Leisure / Recreation Do You Have Hobbies?: No  Exercise/Diet: Exercise/Diet Do You Exercise?: Yes What Type of Exercise Do You Do?: Other (Comment) (Walking) How Many Times a Week Do You Exercise?: Daily Have You Gained or Lost A Significant Amount of Weight in the Past Six Months?: No Do You Follow a Special Diet?: No Do You Have Any Trouble Sleeping?: Yes Explanation of Sleeping Difficulties: Patient reports sleeping 6-8 hrs if she has to work. If she doesn't have to work patient states she sleeps most of  the weekend.   CCA Employment/Education Employment/Work Situation: Employment / Work Situation Employment Situation: Employed Work Stressors: Employed as a Designer, Industrial/product. States that her job has caused her alot of stress and anxiety, however; things have improved at work and she states lately it's become not so stressful. Patient's Job has Been Impacted by Current Illness: No Has Patient ever Been in the [employer]?: No  Education: Education Is Patient Currently Attending School?: No Did You Product Manager?: No Did You Have An Individualized Education Program (IIEP): No Did You Have Any Difficulty At School?: No Patient's Education Has Been Impacted by Current Illness: No   CCA Family/Childhood History Family and Relationship History:    Childhood History:          CCA Substance Use Alcohol/Drug Use: Alcohol / Drug Use Pain Medications: SEE MAR  Prescriptions: SEE MAR Over the Counter: SEE MAr History of alcohol / drug use?: Yes Longest period of sobriety (when/how long): No sobriety in the past 3 years. Negative Consequences of Use: Work / Programmer, Multimedia, Personal relationships Withdrawal Symptoms: Tremors, Irritability Substance #1 Name of Substance 1: Alcohol 1 - Age of First Use: 48 years old 1 - Amount (size/oz): 2 large glasses of wine 1 - Frequency: daily 1 - Duration: on-going, daily, for the past 3 years. 1 - Last Use / Amount: 02/17/2024 1 - Method of Aquiring: varies 1- Route of Use: oral                       ASAM's:  Six Dimensions of Multidimensional Assessment  Dimension 1:  Acute Intoxication and/or Withdrawal Potential:      Dimension 2:  Biomedical Conditions and Complications:      Dimension 3:  Emotional, Behavioral, or Cognitive Conditions and Complications:     Dimension 4:  Readiness to Change:     Dimension 5:  Relapse, Continued use, or Continued Problem Potential:     Dimension 6:  Recovery/Living Environment:     ASAM  Severity Score:    ASAM Recommended Level of Treatment:     Substance use Disorder (SUD) Substance Use Disorder (SUD)  Checklist Symptoms of Substance Use: Continued use despite having a persistent/recurrent physical/psychological problem caused/exacerbated by use, Continued use despite persistent or recurrent social, interpersonal problems, caused or exacerbated by use, Evidence of tolerance, Evidence of withdrawal (Comment), Persistent desire or unsuccessful efforts to cut down or control use, Presence of craving or strong urge to use, Social, occupational, recreational activities given up or reduced due to use  Recommendations for Services/Supports/Treatments: Recommendations for Services/Supports/Treatments Recommendations For Services/Supports/Treatments: Medication Management, CD-IOP Intensive Chemical Dependency Program, Facility Based Crisis  Disposition Recommendation per psychiatric provider: We recommend inpatient psychiatric hospitalization when medically cleared. Patient is under voluntary admission status at this time; please IVC if attempts to leave hospital.   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

## 2024-02-19 ENCOUNTER — Other Ambulatory Visit (HOSPITAL_COMMUNITY)
Admission: EM | Admit: 2024-02-19 | Discharge: 2024-02-25 | Disposition: A | Discharge status: Home / Self Care | Source: Intra-hospital

## 2024-02-19 DIAGNOSIS — F1020 Alcohol dependence, uncomplicated: Secondary | ICD-10-CM | POA: Diagnosis present

## 2024-02-19 MED ORDER — HYDROXYZINE HCL 25 MG PO TABS
25.0000 mg | ORAL_TABLET | Freq: Four times a day (QID) | ORAL | Status: AC | PRN
  Administered 2024-02-19 – 2024-02-21 (×5): 25 mg via ORAL
  Filled 2024-02-19 (×5): qty 1

## 2024-02-19 MED ORDER — MELATONIN 5 MG PO TABS
5.0000 mg | ORAL_TABLET | Freq: Every evening | ORAL | Status: DC | PRN
  Administered 2024-02-22: 5 mg via ORAL
  Filled 2024-02-19 (×3): qty 1

## 2024-02-19 MED ORDER — FLUOXETINE HCL 20 MG PO CAPS: 20.0000 mg | ORAL_CAPSULE | Freq: Every day | ORAL | Status: DC

## 2024-02-19 MED ORDER — GLUCERNA SHAKE PO LIQD
237.0000 mL | ORAL | Status: DC
  Administered 2024-02-20: 237 mL via ORAL

## 2024-02-19 MED ORDER — ALUM & MAG HYDROXIDE-SIMETH 200-200-20 MG/5ML PO SUSP: 30.0000 mL | ORAL | Status: DC | PRN

## 2024-02-19 MED ORDER — CHLORDIAZEPOXIDE HCL 25 MG PO CAPS
25.0000 mg | ORAL_CAPSULE | ORAL | Status: AC
  Administered 2024-02-21 (×2): 25 mg via ORAL
  Filled 2024-02-19 (×2): qty 1

## 2024-02-19 MED ORDER — AMLODIPINE BESYLATE 5 MG PO TABS
5.0000 mg | ORAL_TABLET | Freq: Every day | ORAL | Status: DC
  Administered 2024-02-19 – 2024-02-24 (×6): 5 mg via ORAL
  Filled 2024-02-19 (×6): qty 1

## 2024-02-19 MED ORDER — THIAMINE HCL 100 MG/ML IJ SOLN: 100.0000 mg | Freq: Once | INTRAMUSCULAR | Status: DC

## 2024-02-19 MED ORDER — LOPERAMIDE HCL 2 MG PO CAPS
2.0000 mg | ORAL_CAPSULE | Freq: Once | ORAL | Status: AC
  Administered 2024-02-19: 2 mg via ORAL
  Filled 2024-02-19: qty 1

## 2024-02-19 MED ORDER — HALOPERIDOL LACTATE 5 MG/ML IJ SOLN: 5.0000 mg | Freq: Three times a day (TID) | INTRAMUSCULAR | Status: DC | PRN

## 2024-02-19 MED ORDER — DIPHENHYDRAMINE HCL 50 MG PO CAPS
50.0000 mg | ORAL_CAPSULE | Freq: Three times a day (TID) | ORAL | Status: DC | PRN
  Filled 2024-02-19: qty 1

## 2024-02-19 MED ORDER — LORAZEPAM 2 MG/ML IJ SOLN: 2.0000 mg | Freq: Three times a day (TID) | INTRAMUSCULAR | Status: DC | PRN

## 2024-02-19 MED ORDER — HALOPERIDOL LACTATE 5 MG/ML IJ SOLN: 10.0000 mg | Freq: Three times a day (TID) | INTRAMUSCULAR | Status: DC | PRN

## 2024-02-19 MED ORDER — LOPERAMIDE HCL 2 MG PO CAPS
2.0000 mg | ORAL_CAPSULE | ORAL | Status: DC | PRN
  Administered 2024-02-21 – 2024-02-22 (×2): 2 mg via ORAL
  Filled 2024-02-19 (×3): qty 1

## 2024-02-19 MED ORDER — CHLORDIAZEPOXIDE HCL 25 MG PO CAPS: 25.0000 mg | ORAL_CAPSULE | Freq: Four times a day (QID) | ORAL | Status: AC | PRN

## 2024-02-19 MED ORDER — CHLORDIAZEPOXIDE HCL 25 MG PO CAPS
25.0000 mg | ORAL_CAPSULE | Freq: Every day | ORAL | Status: AC
  Administered 2024-02-22: 25 mg via ORAL
  Filled 2024-02-19: qty 1

## 2024-02-19 MED ORDER — FLUOXETINE HCL 20 MG PO CAPS
20.0000 mg | ORAL_CAPSULE | Freq: Every day | ORAL | Status: DC
  Administered 2024-02-19 – 2024-02-21 (×3): 20 mg via ORAL
  Filled 2024-02-19 (×3): qty 1

## 2024-02-19 MED ORDER — LOPERAMIDE HCL 2 MG PO CAPS: 2.0000 mg | ORAL_CAPSULE | ORAL | Status: DC | PRN

## 2024-02-19 MED ORDER — ONDANSETRON 4 MG PO TBDP
4.0000 mg | ORAL_TABLET | Freq: Four times a day (QID) | ORAL | Status: AC | PRN
  Administered 2024-02-19 – 2024-02-21 (×3): 4 mg via ORAL
  Filled 2024-02-19 (×3): qty 1

## 2024-02-19 MED ORDER — DIPHENHYDRAMINE HCL 50 MG/ML IJ SOLN: 50.0000 mg | Freq: Three times a day (TID) | INTRAMUSCULAR | Status: DC | PRN

## 2024-02-19 MED ORDER — CHLORDIAZEPOXIDE HCL 25 MG PO CAPS
25.0000 mg | ORAL_CAPSULE | Freq: Four times a day (QID) | ORAL | Status: AC
  Administered 2024-02-19 (×4): 25 mg via ORAL
  Filled 2024-02-19 (×4): qty 1

## 2024-02-19 MED ORDER — MAGNESIUM HYDROXIDE 400 MG/5ML PO SUSP
30.0000 mL | Freq: Every day | ORAL | Status: DC | PRN
  Administered 2024-02-24: 30 mL via ORAL
  Filled 2024-02-19: qty 30

## 2024-02-19 MED ORDER — CHLORDIAZEPOXIDE HCL 25 MG PO CAPS
25.0000 mg | ORAL_CAPSULE | Freq: Three times a day (TID) | ORAL | Status: AC
  Administered 2024-02-20 (×3): 25 mg via ORAL
  Filled 2024-02-19 (×3): qty 1

## 2024-02-19 MED ORDER — ADULT MULTIVITAMIN W/MINERALS CH
1.0000 | ORAL_TABLET | Freq: Every day | ORAL | Status: DC
  Administered 2024-02-19 – 2024-02-25 (×7): 1 via ORAL
  Filled 2024-02-19 (×7): qty 1

## 2024-02-19 MED ORDER — ACETAMINOPHEN 325 MG PO TABS
650.0000 mg | ORAL_TABLET | Freq: Four times a day (QID) | ORAL | Status: DC | PRN
  Administered 2024-02-22 – 2024-02-24 (×4): 650 mg via ORAL
  Filled 2024-02-19 (×4): qty 2

## 2024-02-19 MED ORDER — HALOPERIDOL 5 MG PO TABS
5.0000 mg | ORAL_TABLET | Freq: Three times a day (TID) | ORAL | Status: DC | PRN
  Administered 2024-02-22: 5 mg via ORAL
  Filled 2024-02-19: qty 1

## 2024-02-19 NOTE — Group Note (Signed)
 Group Topic: Communication  Group Date: 02/19/2024 Start Time: 1220 End Time: 1240 Facilitators: Herold Lajuana NOVAK, RN  Department: [facility]  Number of Participants: 6  Group Focus: coping skills Treatment Modality:  Individual Therapy Interventions utilized were leisure development Purpose: increase insight  Name: Kiyo Heal Klier Date of Birth: Aug 15, 1975  MR: [id number]    Level of Participation: active Quality of Participation: cooperative Interactions with others: gave feedback Mood/Affect: appropriate Triggers (if applicable): none identified Cognition: coherent/clear Progress: Significant Response: My goal is to learn better mechanisms for stressful times and to stay sober Plan: patient will be encouraged to remain tx compliant and seek help with any cravings or withdrawals  Patients Problems:  Patient Active Problem List   Diagnosis Date Noted   Alcohol use disorder, severe, dependence (HCC) 02/19/2024   Alcohol use disorder 02/18/2024   Murmur 09/27/2022   Family history of breast cancer in mother 09/26/2022   FH: stroke 09/26/2022   FH: heart disease 09/26/2022   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

## 2024-02-19 NOTE — ED Notes (Signed)
 Pt is sleeping at this time, no acute distress noted. Respirations are even and unlabored. Q15 safety checks in place.

## 2024-02-19 NOTE — ED Provider Notes (Signed)
 Behavioral Health Progress Note  Date and Time: 02/19/2024 2:54 PM Name: Jennifer Mayo MRN:  [id number]  Subjective:  Pt reports that she drinks 2 large glasses of wine each night and more on the weekends. She feels likes she is depressed currently. She has a history of depression. Her sleep last night was good. Her appetite is low. 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 Her weight has been stable in the 160s.  She complains of a headache, tremors and diarrhea.   Diagnosis:  Final diagnoses:  Alcohol use disorder, severe, dependence (HCC)    Total Time spent with patient: 30 minutes  Past Psychiatric History: MDD, GAD, PTSD and ADHD. No psych hospitalizations or suicide attempts.  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 She lives with her husband and 2 adult sons  Sleep: Good  Appetite:  Fair  Current Medications:  Current Facility-Administered Medications  Medication Dose Route Frequency Provider Last Rate Last Admin   acetaminophen  (TYLENOL ) tablet 650 mg  650 mg Oral Q6H PRN Onuoha, Chinwendu V, NP       alum & mag hydroxide-simeth (MAALOX/MYLANTA) 200-200-20 MG/5ML suspension 30 mL  30 mL Oral Q4H PRN Onuoha, Chinwendu V, NP       amLODipine  (NORVASC ) tablet 5 mg  5 mg Oral QHS Onuoha, Chinwendu V, NP       chlordiazePOXIDE (LIBRIUM) capsule 25 mg  25 mg Oral Q6H PRN Onuoha, Chinwendu V, NP       chlordiazePOXIDE (LIBRIUM) capsule 25 mg  25 mg Oral QID Onuoha, Chinwendu V, NP   25 mg at 02/19/24 1335   Followed by   NOREEN ON 02/20/2024] chlordiazePOXIDE (LIBRIUM) capsule 25 mg  25 mg Oral TID Onuoha, Chinwendu V, NP       Followed by   NOREEN ON 02/21/2024] chlordiazePOXIDE (LIBRIUM)  capsule 25 mg  25 mg Oral BH-qamhs Onuoha, Chinwendu V, NP       Followed by   NOREEN ON 02/22/2024] chlordiazePOXIDE (LIBRIUM) capsule 25 mg  25 mg Oral Daily Onuoha, Chinwendu V, NP       haloperidol (HALDOL) tablet 5 mg  5 mg Oral TID PRN Onuoha, Chinwendu V, NP       And   diphenhydrAMINE  (BENADRYL ) capsule 50 mg  50 mg Oral TID PRN Onuoha, Chinwendu V, NP       haloperidol lactate (HALDOL) injection 5 mg  5 mg Intramuscular TID PRN Onuoha, Chinwendu V, NP       And   diphenhydrAMINE  (BENADRYL ) injection 50 mg  50 mg Intramuscular TID PRN Onuoha, Chinwendu V, NP       And   LORazepam (ATIVAN) injection 2 mg  2 mg Intramuscular TID PRN Onuoha, Chinwendu V, NP       haloperidol lactate (HALDOL) injection 10 mg  10 mg Intramuscular TID PRN Onuoha, Chinwendu V, NP       And   diphenhydrAMINE  (BENADRYL ) injection 50 mg  50 mg Intramuscular TID PRN Onuoha, Chinwendu V, NP       And   LORazepam (ATIVAN) injection 2 mg  2 mg Intramuscular TID PRN Onuoha, Chinwendu V, NP       [START ON 02/20/2024] feeding supplement (GLUCERNA SHAKE) (GLUCERNA SHAKE) liquid  237 mL  237 mL Oral Q24H Gradie Butrick J, MD       FLUoxetine  (PROZAC ) capsule 20 mg  20 mg Oral Daily Lawrnce, Darline Faith J, MD   20 mg at 02/19/24 1335   hydrOXYzine (ATARAX) tablet 25 mg  25 mg Oral Q6H PRN Onuoha, Chinwendu V, NP       loperamide (IMODIUM) capsule 2-4 mg  2-4 mg Oral PRN Zyrion Coey J, MD       magnesium hydroxide (MILK OF MAGNESIA) suspension 30 mL  30 mL Oral Daily PRN Onuoha, Chinwendu V, NP       melatonin tablet 5 mg  5 mg Oral QHS PRN Onuoha, Chinwendu V, NP       multivitamin with minerals tablet 1 tablet  1 tablet Oral Daily Onuoha, Chinwendu V, NP   1 tablet at 02/19/24 0921   ondansetron  (ZOFRAN -ODT) disintegrating tablet 4 mg  4 mg Oral Q6H PRN Onuoha, Chinwendu V, NP   4 mg at 02/19/24 1002   Current Outpatient Medications  Medication Sig Dispense Refill   AIMOVIG  140 MG/ML SOAJ Inject 140 mg into the  skin every 30 (thirty) days.     amLODipine  (NORVASC ) 2.5 MG tablet Take 1 tablet (2.5 mg total) by mouth 2 (two) times daily. (Patient taking differently: Take 5 mg by mouth at bedtime.) 180 tablet 1   FLUoxetine  (PROZAC ) 10 MG capsule Take 1 capsule (10 mg total) by mouth daily. (Patient taking differently: Take 20 mg by mouth at bedtime.) 30 capsule 0   Multiple Vitamins-Minerals (BARIATRIC MULTIVITAMINS/IRON  PO) Take 1 tablet by mouth daily.     Multiple Vitamins-Minerals (HAIR SKIN & NAILS PO) Take 2 tablets by mouth daily.     ondansetron  (ZOFRAN -ODT) 4 MG disintegrating tablet Take 4-8 mg by mouth every 8 (eight) hours as needed for nausea or vomiting.     OVER THE COUNTER MEDICATION Take 1 tablet by mouth in the morning, at noon, and at bedtime. Calcium  with Vitamin D  Chews      Labs  Lab Results:  Admission on 02/18/2024, Discharged on 02/18/2024  Component Date Value Ref Range Status   WBC 02/18/2024 2.4 (L)  4.0 - 10.5 K/uL Final   RBC 02/18/2024 4.44  3.87 - 5.11 MIL/uL Final   Hemoglobin 02/18/2024 13.8  12.0 - 15.0 g/dL Final   HCT 89/70/7974 41.8  36.0 - 46.0 % Final   MCV 02/18/2024 94.1  80.0 - 100.0 fL Final   MCH 02/18/2024 31.1  26.0 - 34.0 pg Final   MCHC 02/18/2024 33.0  30.0 - 36.0 g/dL Final   RDW 89/70/7974 14.2  11.5 - 15.5 % Final   Platelets 02/18/2024 118 (L)  150 - 400 K/uL Final   nRBC 02/18/2024 0.0  0.0 - 0.2 % Final   Neutrophils Relative % 02/18/2024 59  % Final   Neutro Abs 02/18/2024 1.4 (L)  1.7 - 7.7 K/uL Final   Lymphocytes Relative 02/18/2024 29  % Final   Lymphs Abs 02/18/2024 0.7  0.7 - 4.0 K/uL Final   Monocytes Relative 02/18/2024 11  % Final   Monocytes Absolute 02/18/2024 0.3  0.1 - 1.0 K/uL Final   Eosinophils Relative 02/18/2024 0  % Final   Eosinophils Absolute 02/18/2024 0.0  0.0 - 0.5 K/uL Final   Basophils Relative 02/18/2024 1  % Final   Basophils Absolute 02/18/2024 0.0  0.0 - 0.1 K/uL Final   Immature Granulocytes 02/18/2024 0   % Final   Abs Immature Granulocytes  02/18/2024 0.01  0.00 - 0.07 K/uL Final   Performed at [facility], [address], [place], [place] [address]   Sodium 02/18/2024 143  135 - 145 mmol/L Final   Potassium 02/18/2024 4.2  3.5 - 5.1 mmol/L Final   Chloride 02/18/2024 100  98 - 111 mmol/L Final   CO2 02/18/2024 26  22 - 32 mmol/L Final   Glucose, Bld 02/18/2024 88  70 - 99 mg/dL Final   Glucose reference range applies only to samples taken after fasting for at least 8 hours.   BUN 02/18/2024 7  6 - 20 mg/dL Final   Creatinine, Ser 02/18/2024 0.56  0.44 - 1.00 mg/dL Final   Calcium  02/18/2024 8.8 (L)  8.9 - 10.3 mg/dL Final   Total Protein 89/70/7974 6.4 (L)  6.5 - 8.1 g/dL Final   Albumin 89/70/7974 3.6  3.5 - 5.0 g/dL Final   AST 89/70/7974 62 (H)  15 - 41 U/L Final   ALT 02/18/2024 66 (H)  0 - 44 U/L Final   Alkaline Phosphatase 02/18/2024 92  38 - 126 U/L Final   Total Bilirubin 02/18/2024 0.5  0.0 - 1.2 mg/dL Final   GFR, Estimated 02/18/2024 >60  >60 mL/min Final   Comment: (NOTE) Calculated using the CKD-EPI Creatinine Equation (2021)    Anion gap 02/18/2024 17 (H)  5 - 15 Final   Performed at [facility], [address], [place], [place] [address]   Magnesium 02/18/2024 2.1  1.7 - 2.4 mg/dL Final   Performed at [facility], [address] [place], [place] [address]   Alcohol, Ethyl (B) 02/18/2024 153 (H)  <15 mg/dL Final   Comment: (NOTE) For medical purposes only. Performed at [facility], [address] [place], [place] [address]    TSH 02/18/2024 2.357  0.350 - 4.500 uIU/mL Final   Comment: Performed by a 3rd Generation assay with a functional sensitivity of <=0.01 uIU/mL. Performed at [facility], [address] [place], [place] [address]    Preg Test, Ur 02/18/2024 Negative  Negative Final   POC Amphetamine UR 02/18/2024 None Detected  NONE DETECTED (Cut Off Level 1000 ng/mL) Final   POC Secobarbital (BAR) 02/18/2024 None Detected   NONE DETECTED (Cut Off Level 300 ng/mL) Final   POC Buprenorphine (BUP) 02/18/2024 None Detected  NONE DETECTED (Cut Off Level 10 ng/mL) Final   POC Oxazepam (BZO) 02/18/2024 None Detected  NONE DETECTED (Cut Off Level 300 ng/mL) Final   POC Cocaine UR 02/18/2024 None Detected  NONE DETECTED (Cut Off Level 300 ng/mL) Final   POC Methamphetamine UR 02/18/2024 None Detected  NONE DETECTED (Cut Off Level 1000 ng/mL) Final   POC Morphine  02/18/2024 None Detected  NONE DETECTED (Cut Off Level 300 ng/mL) Final   POC Methadone UR 02/18/2024 None Detected  NONE DETECTED (Cut Off Level 300 ng/mL) Final   POC Oxycodone  UR 02/18/2024 None Detected  NONE DETECTED (Cut Off Level 100 ng/mL) Final   POC Marijuana UR 02/18/2024 None Detected  NONE DETECTED (Cut Off Level 50 ng/mL) Final   Preg Test, Ur 02/18/2024 NEGATIVE  NEGATIVE Final   Comment:        THE SENSITIVITY OF THIS METHODOLOGY IS >20 mIU/mL.     Blood Alcohol level:  Lab Results  Component Value Date   ETH 153 (H) 02/18/2024   ETH 265 (H) 11/30/2022    Metabolic Disorder Labs: Lab Results  Component Value Date  HGBA1C 5.1 09/26/2022   MPG 102.54 11/20/2017   No results found for: PROLACTIN Lab Results  Component Value Date   CHOL 188 09/26/2022   TRIG 85.0 09/26/2022   HDL 121.30 09/26/2022   CHOLHDL 2 09/26/2022   VLDL 17.0 09/26/2022   LDLCALC 49 09/26/2022   LDLCALC 72 11/20/2017    Therapeutic Lab Levels: No results found for: LITHIUM No results found for: VALPROATE No results found for: CBMZ  Physical Findings   GAD-7    Flowsheet Row Office Visit from 09/26/2022 in [facility] [place] Office Visit from 08/26/2022 in [facility]  Total GAD-7 Score 8 8   PHQ2-9    Flowsheet Row ED from 02/19/2024 in [facility] ED from 02/18/2024 in [facility] Office Visit from 02/12/2024 in [facility] at [facility] Office Visit from 09/26/2022 in [facility] [place] Office Visit from 08/26/2022 in [facility] [place]  PHQ-2 Total Score 5 5 2 4 1   PHQ-9 Total Score 18 18 16 15 14    Flowsheet Row ED from 02/18/2024 in [facility] Emergency Department at [facility] Most recent reading at 02/18/2024  1:35 PM ED from 02/18/2024 in [facility] Most recent reading at 02/18/2024 10:11 AM ED from 02/18/2024 in [facility] Most recent reading at 02/18/2024  7:35 AM  C-SSRS RISK CATEGORY No Risk No Risk No Risk     Musculoskeletal  Strength & Muscle Tone: decreased Gait & Station: uses a walker Patient leans: N/A  Psychiatric Specialty Exam  Presentation  General Appearance:  Appropriate for Environment; Casual  Eye Contact: Good  Speech: Clear and Coherent  Speech Volume: Normal  Handedness: Right   Mood and Affect  Mood: Depressed  Affect: Congruent   Thought Process  Thought Processes: Coherent  Descriptions of Associations:Intact  Orientation:Full (Time, Place and Person)  Thought Content:Logical  Diagnosis of Schizophrenia or Schizoaffective disorder in past: No    Hallucinations:Hallucinations: None  Ideas of Reference:None  Suicidal Thoughts:Suicidal Thoughts: No  Homicidal Thoughts:Homicidal Thoughts: No   Sensorium  Memory: Immediate Good; Recent Good; Remote Good  Judgment: Intact  Insight: Present   Executive Functions  Concentration: Good  Attention Span: Good  Recall: Good  Fund of Knowledge: Good  Language: Good   Psychomotor Activity  Psychomotor Activity: Psychomotor Activity: Normal   Assets  Assets: Communication Skills; Desire for Improvement; Financial Resources/Insurance; Housing; Talents/Skills; Transportation; Vocational/Educational   Sleep   Sleep: Sleep: Good  Estimated Sleeping Duration (Last 24 Hours): 0.00 hours  Nutritional Assessment (For OBS and [facility] admissions only) Has the patient had a weight loss or gain of 10 pounds or more in the last 3 months?: No Has the patient had a decrease in food intake/or appetite?: No Does the patient have dental problems?: No Does the patient have eating habits or behaviors that may be indicators of an eating disorder including binging or inducing vomiting?: No Has the patient recently lost weight without trying?: 0 Has the patient been eating poorly because of a decreased appetite?: 0 Malnutrition Screening Tool Score: 0    Physical Exam  Physical Exam Vitals reviewed.  HENT:     Head: Normocephalic and atraumatic.     Nose: Nose normal.     Mouth/Throat:     Pharynx: Oropharynx is clear.  Eyes:  Conjunctiva/sclera: Conjunctivae normal.  Pulmonary:     Effort: Pulmonary effort is normal.  Musculoskeletal:        General: Normal range of motion.     Cervical back: Normal range of motion.  Skin:    General: Skin is dry.  Neurological:     Mental Status: She is alert and oriented to person, place, and time.    Review of Systems  Constitutional:  Negative for chills, fever and weight loss.  HENT:  Negative for hearing loss.   Eyes:  Negative for blurred vision.  Respiratory:  Negative for cough.   Cardiovascular:  Negative for chest pain.  Gastrointestinal:  Positive for diarrhea and nausea.  Genitourinary:  Negative for dysuria.  Musculoskeletal:  Negative for myalgias.  Neurological:  Positive for tremors and headaches.  Psychiatric/Behavioral:  Positive for depression and substance abuse. Negative for hallucinations, memory loss and suicidal ideas. The patient is not nervous/anxious and does not have insomnia.    Blood pressure 126/72, pulse 88, temperature 98.7 F (37.1 C), temperature source Oral, resp. rate 18, SpO2 100%. There is no height or weight on  file to calculate BMI.  Treatment Plan Summary: 47 year old female patient with a family history of mood disorder and with a personal psychiatric history of MDD, GAD, PTSD and ADHD and medical history of gastric by-pass surgery, HTN, cardiac disease, B12 def, Narcolepsy, and hemiplegic migraines. 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 She presents for alcohol detox.  Gastric By-pass Vitamins related to this and glucerna  HTN  Continue amlodipine   Alcohol dep CIWA protocol  MDD and GAD and PTSD Prozac  20 mg  Social work arranging outpatient substance treatment and psychiatric care.  Daily contact. Pt will likely discharge 11/3 if withdrawal/detox protocol is completed without further complications.  Garvin JINNY Gaines, MD 02/19/2024 2:54 PM

## 2024-02-19 NOTE — Care Management (Addendum)
 [facility] Care Management...  Writer met with patient to discuss discharge planning.  Patient stated unable to do inpatient due to working.  Patient requested out patient services. Patient reporting having an appointment with her psychiatrist November 13, 20025 for Medication Management   Patient will receive OPT with Elsie Prim LCSW @  [facility] at [facility] [address] Christianna Jewell BOTTOM  Phone [phone]  Writer waiting for appointment time and date  4:00 pm Per Johnsie Patient has appointment on Thursday February 26, 2024 at 9:30 am

## 2024-02-19 NOTE — Group Note (Signed)
 Group Topic: Change and Accountability  Group Date: 02/19/2024 Start Time: 2030 End Time: 2100 Facilitators: Anice Benton LABOR, NT  Department: [facility]  Number of Participants: 2  Group Focus: feeling awareness/expression and personal responsibility Treatment Modality:  Individual Therapy Interventions utilized were group exercise Purpose: express feelings and increase insight  Name: Jennifer Mayo Date of Birth: 1975/07/18  MR: [id number]    Level of Participation: Did Not Attend  Quality of Participation: N/A Interactions with others: N/A Mood/Affect: N/A Triggers (if applicable): N/A Cognition: N/A Progress: N/A Response: N/A Plan: patient will be encouraged to attend groups  Patients Problems:  Patient Active Problem List   Diagnosis Date Noted   Alcohol use disorder, severe, dependence (HCC) 02/19/2024   Alcohol use disorder 02/18/2024   Murmur 09/27/2022   Family history of breast cancer in mother 09/26/2022   FH: stroke 09/26/2022   FH: heart disease 09/26/2022   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

## 2024-02-19 NOTE — ED Notes (Signed)
 Pt sitting in dayroom watching television and interacting with peers. No acute distress noted. No concerns voiced. Informed pt to notify staff with any needs or assistance. Pt verbalized understanding and agreement. Will continue to monitor for safety.

## 2024-02-19 NOTE — ED Notes (Signed)
 Pt is sleeping, no acute distress noted.

## 2024-02-19 NOTE — ED Notes (Signed)
 Pt visiting with her husband, Charlie, in dayroom with staff supervision. No acute distress noted. Safety maintained.

## 2024-02-19 NOTE — Group Note (Signed)
 Group Topic: Healthy Self Image and Positive Change  Group Date: 02/19/2024 Start Time: 1300 End Time: 1330 Facilitators: Laneta Renea POUR, NT  Department: [facility]  Number of Participants: 1  Group Focus: coping skills Treatment Modality:  Psychoeducation Interventions utilized were problem solving Purpose: increase insight  Name: Jennifer Mayo Date of Birth: 05-18-1975  MR: [id number]    Level of Participation: active Quality of Participation: engaged Interactions with others: gave feedback Mood/Affect: bright and positive Triggers (if applicable): none Cognition: coherent/clear and insightful Progress: Gaining insight Response:  It has been a positive experience being here.  Plan: patient will be encouraged to attend groups.   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

## 2024-02-19 NOTE — ED Notes (Signed)
 Pt sitting in dayroom eating dinner, watching television and interacting with peers. No acute distress noted. No concerns voiced. Informed pt to notify staff with any needs or assistance. Pt verbalized understanding and agreement. Will continue to monitor for safety.

## 2024-02-19 NOTE — ED Provider Notes (Addendum)
 Facility Based Crisis Admission H&P  Date: 02/19/24 Patient Name: Jennifer Mayo MRN: [id number] Chief Complaint: alcohol detox  Diagnoses:  Final diagnoses:  Alcohol use disorder, severe, dependence (HCC)    HPI: Jennifer Mayo is a 48 year old female with psychiatric history of adjustment disorder with mixed anxiety and depressed mood, alcohol use disorder, mild recurrent major depression, and panic disorder.  Patient was seen face-to-face by this provider and chart reviewed. Her chart review, patient initially presented to the [facility] 10/28 seeking detox from alcohol.  Patient was transferred to [facility] for medical clearance due to unstable vital signs. Patient evaluated at the [facility] and treated for alcohol withdrawal syndrome without complication. She is medically cleared and has been readmitted to the [facility] for alcohol detox treatment.   Patient reports she is feeling better.  Patient completed the PHQ 9 questionnaire and obtained a total score of 18, indicating moderately severe depression. Patient already on treatment with recent dosage increase.   On evaluation, patient is alert, oriented x 3, and cooperative. Speech is clear and coherent. Pt appears in hospital scrubs. Eye contact is good. Mood is anxious and depressed, affect is congruent with mood. Thought process is coherent and thought content is WDL. Pt denies SI/HI/AVH. There is no objective indication that the patient is responding to internal stimuli. No delusions elicited during this assessment.     PHQ 2-9:  Flowsheet Row ED from 02/19/2024 in [facility] ED from 02/18/2024 in [facility] Office Visit from 02/12/2024 in [facility]  Thoughts that you would be better off dead, or of hurting yourself in some way Not at all Not at all Not at all  PHQ-9 Total Score 18 18 16     Flowsheet Row ED from 02/18/2024 in [facility]  Emergency Department at [facility] Most recent reading at 02/18/2024  1:35 PM ED from 02/18/2024 in [facility] Most recent reading at 02/18/2024 10:11 AM ED from 02/18/2024 in [facility] Most recent reading at 02/18/2024  7:35 AM  C-SSRS RISK CATEGORY No Risk No Risk No Risk      Total Time spent with patient: 20 minutes  Musculoskeletal  Strength & Muscle Tone: decreased Gait & Station: ambulates with a walker Patient leans: Uses a walker  Psychiatric Specialty Exam  Presentation General Appearance:  Other (comment) (in hospital scrubs)  Eye Contact: Good  Speech: Clear and Coherent  Speech Volume: Normal  Handedness: Right   Mood and Affect  Mood: Depressed; Anxious  Affect: Congruent   Thought Process  Thought Processes: Coherent  Descriptions of Associations:Intact  Orientation:Full (Time, Place and Person)  Thought Content:WDL  Diagnosis of Schizophrenia or Schizoaffective disorder in past: No   Hallucinations:Hallucinations: None  Ideas of Reference:None  Suicidal Thoughts:Suicidal Thoughts: No  Homicidal Thoughts:Homicidal Thoughts: No   Sensorium  Memory: Immediate Good  Judgment: Intact  Insight: Present   Executive Functions  Concentration: Good  Attention Span: Good  Recall: Good  Fund of Knowledge: Good  Language: Good   Psychomotor Activity  Psychomotor Activity: Psychomotor Activity: Normal   Assets  Assets: Communication Skills; Desire for Improvement   Sleep  Sleep: Sleep: Fair   Nutritional Assessment (For OBS and FBC admissions only) Has the patient had a weight loss or gain of 10 pounds or more in the last 3 months?: No Has the patient had a decrease in food intake/or appetite?:  No Does the patient have dental problems?: No Does the patient have eating habits or behaviors that may be indicators of an eating disorder  including binging or inducing vomiting?: No Has the patient recently lost weight without trying?: 0 Has the patient been eating poorly because of a decreased appetite?: 0 Malnutrition Screening Tool Score: 0    Physical Exam Constitutional:      General: She is not in acute distress.    Appearance: She is not diaphoretic.  Pulmonary:     Effort: No respiratory distress.  Chest:     Chest wall: No tenderness.  Neurological:     Mental Status: She is alert and oriented to person, place, and time.  Psychiatric:        Attention and Perception: Attention and perception normal.        Mood and Affect: Mood is anxious and depressed. Affect is flat.        Speech: Speech normal.        Behavior: Behavior is cooperative.        Thought Content: Thought content normal.    Review of Systems  Constitutional:  Negative for chills, diaphoresis and fever.  HENT:  Negative for congestion.   Eyes:  Negative for discharge.  Respiratory:  Negative for cough, shortness of breath and wheezing.   Cardiovascular:  Negative for chest pain and palpitations.  Gastrointestinal:  Negative for diarrhea, nausea and vomiting.  Neurological:  Negative for dizziness, seizures, weakness and headaches.  Psychiatric/Behavioral:  Positive for depression and substance abuse. The patient is nervous/anxious.      Past Psychiatric History: See H & P   Is the patient at risk to self? Yes  Has the patient been a risk to self in the past 6 months? Yes .    Has the patient been a risk to self within the distant past? Yes   Is the patient a risk to others? No   Has the patient been a risk to others in the past 6 months? No   Has the patient been a risk to others within the distant past? No   Past Medical History: See chart Family History: N/A Social History: N/A  Last Labs:  Admission on 02/18/2024, Discharged on 02/18/2024  Component Date Value Ref Range Status   WBC 02/18/2024 2.4 (L)  4.0 - 10.5 K/uL  Final   RBC 02/18/2024 4.44  3.87 - 5.11 MIL/uL Final   Hemoglobin 02/18/2024 13.8  12.0 - 15.0 g/dL Final   HCT 89/70/7974 41.8  36.0 - 46.0 % Final   MCV 02/18/2024 94.1  80.0 - 100.0 fL Final   MCH 02/18/2024 31.1  26.0 - 34.0 pg Final   MCHC 02/18/2024 33.0  30.0 - 36.0 g/dL Final   RDW 89/70/7974 14.2  11.5 - 15.5 % Final   Platelets 02/18/2024 118 (L)  150 - 400 K/uL Final   nRBC 02/18/2024 0.0  0.0 - 0.2 % Final   Neutrophils Relative % 02/18/2024 59  % Final   Neutro Abs 02/18/2024 1.4 (L)  1.7 - 7.7 K/uL Final   Lymphocytes Relative 02/18/2024 29  % Final   Lymphs Abs 02/18/2024 0.7  0.7 - 4.0 K/uL Final   Monocytes Relative 02/18/2024 11  % Final   Monocytes Absolute 02/18/2024 0.3  0.1 - 1.0 K/uL Final   Eosinophils Relative 02/18/2024 0  % Final   Eosinophils Absolute 02/18/2024 0.0  0.0 - 0.5 K/uL Final   Basophils Relative 02/18/2024 1  %  Final   Basophils Absolute 02/18/2024 0.0  0.0 - 0.1 K/uL Final   Immature Granulocytes 02/18/2024 0  % Final   Abs Immature Granulocytes 02/18/2024 0.01  0.00 - 0.07 K/uL Final   Performed at [facility], [address], [place], [place] [address]   Sodium 02/18/2024 143  135 - 145 mmol/L Final   Potassium 02/18/2024 4.2  3.5 - 5.1 mmol/L Final   Chloride 02/18/2024 100  98 - 111 mmol/L Final   CO2 02/18/2024 26  22 - 32 mmol/L Final   Glucose, Bld 02/18/2024 88  70 - 99 mg/dL Final   Glucose reference range applies only to samples taken after fasting for at least 8 hours.   BUN 02/18/2024 7  6 - 20 mg/dL Final   Creatinine, Ser 02/18/2024 0.56  0.44 - 1.00 mg/dL Final   Calcium  02/18/2024 8.8 (L)  8.9 - 10.3 mg/dL Final   Total Protein 89/70/7974 6.4 (L)  6.5 - 8.1 g/dL Final   Albumin 89/70/7974 3.6  3.5 - 5.0 g/dL Final   AST 89/70/7974 62 (H)  15 - 41 U/L Final   ALT 02/18/2024 66 (H)  0 - 44 U/L Final   Alkaline Phosphatase 02/18/2024 92  38 - 126 U/L Final   Total Bilirubin 02/18/2024 0.5  0.0 - 1.2 mg/dL Final    GFR, Estimated 02/18/2024 >60  >60 mL/min Final   Comment: (NOTE) Calculated using the CKD-EPI Creatinine Equation (2021)    Anion gap 02/18/2024 17 (H)  5 - 15 Final   Performed at [facility], [address] [place], [place] [address]   Magnesium 02/18/2024 2.1  1.7 - 2.4 mg/dL Final   Performed at [facility], [address] [place], [place] [address]   Alcohol, Ethyl (B) 02/18/2024 153 (H)  <15 mg/dL Final   Comment: (NOTE) For medical purposes only. Performed at [facility], [address] [place], [place] [address]    TSH 02/18/2024 2.357  0.350 - 4.500 uIU/mL Final   Comment: Performed by a 3rd Generation assay with a functional sensitivity of <=0.01 uIU/mL. Performed at [facility], [address], [place], [place] [address]    Preg Test, Ur 02/18/2024 Negative  Negative Final   POC Amphetamine UR 02/18/2024 None Detected  NONE DETECTED (Cut Off Level 1000 ng/mL) Final   POC Secobarbital (BAR) 02/18/2024 None Detected  NONE DETECTED (Cut Off Level 300 ng/mL) Final   POC Buprenorphine (BUP) 02/18/2024 None Detected  NONE DETECTED (Cut Off Level 10 ng/mL) Final   POC Oxazepam (BZO) 02/18/2024 None Detected  NONE DETECTED (Cut Off Level 300 ng/mL) Final   POC Cocaine UR 02/18/2024 None Detected  NONE DETECTED (Cut Off Level 300 ng/mL) Final   POC Methamphetamine UR 02/18/2024 None Detected  NONE DETECTED (Cut Off Level 1000 ng/mL) Final   POC Morphine  02/18/2024 None Detected  NONE DETECTED (Cut Off Level 300 ng/mL) Final   POC Methadone UR 02/18/2024 None Detected  NONE DETECTED (Cut Off Level 300 ng/mL) Final   POC Oxycodone  UR 02/18/2024 None Detected  NONE DETECTED (Cut Off Level 100 ng/mL) Final   POC Marijuana UR 02/18/2024 None Detected  NONE DETECTED (Cut Off Level 50 ng/mL) Final   Preg Test, Ur 02/18/2024 NEGATIVE  NEGATIVE Final   Comment:        THE SENSITIVITY OF THIS METHODOLOGY IS >20 mIU/mL.     Allergies: Ranexa [ranolazine],  Nsaids, Verapamil, and Topamax [  topiramate]  Medications:  Facility Ordered Medications  Medication   [COMPLETED] thiamine (VITAMIN B1) injection 100 mg   [COMPLETED] LORazepam (ATIVAN) tablet 2 mg   Or   [COMPLETED] LORazepam (ATIVAN) injection 2 mg   [COMPLETED] lactated ringers  bolus 2,000 mL   [COMPLETED] LORazepam (ATIVAN) injection 1 mg   [COMPLETED] metoprolol tartrate (LOPRESSOR) injection 2.5 mg   acetaminophen  (TYLENOL ) tablet 650 mg   alum & mag hydroxide-simeth (MAALOX/MYLANTA) 200-200-20 MG/5ML suspension 30 mL   magnesium hydroxide (MILK OF MAGNESIA) suspension 30 mL   multivitamin with minerals tablet 1 tablet   chlordiazePOXIDE (LIBRIUM) capsule 25 mg   hydrOXYzine (ATARAX) tablet 25 mg   loperamide (IMODIUM) capsule 2-4 mg   ondansetron  (ZOFRAN -ODT) disintegrating tablet 4 mg   haloperidol (HALDOL) tablet 5 mg   And   diphenhydrAMINE  (BENADRYL ) capsule 50 mg   haloperidol lactate (HALDOL) injection 5 mg   And   diphenhydrAMINE  (BENADRYL ) injection 50 mg   And   LORazepam (ATIVAN) injection 2 mg   haloperidol lactate (HALDOL) injection 10 mg   And   diphenhydrAMINE  (BENADRYL ) injection 50 mg   And   LORazepam (ATIVAN) injection 2 mg   chlordiazePOXIDE (LIBRIUM) capsule 25 mg   Followed by   NOREEN ON 02/20/2024] chlordiazePOXIDE (LIBRIUM) capsule 25 mg   Followed by   NOREEN ON 02/21/2024] chlordiazePOXIDE (LIBRIUM) capsule 25 mg   Followed by   NOREEN ON 02/22/2024] chlordiazePOXIDE (LIBRIUM) capsule 25 mg   amLODipine  (NORVASC ) tablet 5 mg   FLUoxetine  (PROZAC ) capsule 20 mg   melatonin tablet 5 mg   PTA Medications  Medication Sig   Multiple Vitamins-Minerals (BARIATRIC MULTIVITAMINS/IRON  PO) Take 1 tablet by mouth daily.   amLODipine  (NORVASC ) 2.5 MG tablet Take 1 tablet (2.5 mg total) by mouth 2 (two) times daily. (Patient taking differently: Take 5 mg by mouth at bedtime.)   AIMOVIG  140 MG/ML SOAJ Inject 140 mg into the skin every 30 (thirty) days.    FLUoxetine  (PROZAC ) 10 MG capsule Take 1 capsule (10 mg total) by mouth daily. (Patient taking differently: Take 20 mg by mouth at bedtime.)    Long Term Goals: Improvement in symptoms so as ready for discharge  Short Term Goals: Patient will verbalize feelings in meetings with treatment team members., Patient will attend at least of 50% of the groups daily., Pt will complete the PHQ9 on admission, day 3 and discharge., Patient will participate in completing the [place] Suicide Severity Rating Scale, Patient will score a low risk of violence for 24 hours prior to discharge, and Patient will take medications as prescribed daily.  Medical Decision Making  Recommend admission to the [facility] for alcohol detox/treatment.  I have reviewed the current labs, fluids and medications administered at [facility]: Per EDP: Slight decrease in white blood count at 2.4. Also mild thrombocytopenia, Metabolic panel showed increased LFTs, increased anion gap no acute renal dysfunction , Pregnancy test and UDS negative. Alcohol elevated at 153.   Recommend CIWA protocol medications  Recommend agitation protocol medications  Home medications continued - Amlodipine  5 mg p.o. daily at bedtime for HTN - Prozac  20 mg p.o. daily at bedtime for MDD, panic disorder, GAD  Other PRNs - Tylenol , Maalox, MOM, melatonin   Recommendations  Based on my evaluation the patient does not appear to have an emergency medical condition.  Patient admitted to the [facility] for alcohol detox/treatment CIWA protocol  Thurman LULLA Ivans, NP 02/19/24  12:52 AM

## 2024-02-19 NOTE — Group Note (Addendum)
 Group Topic: Communication  Group Date: 02/19/2024 Start Time: 1515 End Time: 1600 Facilitators: Gerome Jolly, NT  Department: [facility]  Number of Participants: 4  Group Focus: communication Treatment Modality:  Dialectical Behavioral Therapy and Spiritual Interventions utilized were exploration Purpose: explore maladaptive thinking, improve communication skills, regain self-worth, and relapse prevention strategies  Name: Douglas Rooks Piekarski Date of Birth: [birth year]  MR: [id number]    Level of Participation: minimal Quality of Participation: attentive Interactions with others: actively listened Mood/Affect: anxious and appropriate Triggers (if applicable): na Cognition: fearful and guilty Progress: Minimal Response: Patient listened to group communication. However, patient became upset and tearful. Patient left group to speak with RN.  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

## 2024-02-19 NOTE — ED Notes (Signed)
 Patient A&Ox4. Denies intent to harm self/others when asked. Denies A/VH. Patient denies any physical complaints when asked. No acute distress noted. Support and encouragement provided. Routine safety checks conducted according to facility protocol. Encouraged patient to notify staff if thoughts of harm toward self or others arise. Patient verbalize understanding and agreement. Will continue to monitor for safety.

## 2024-02-20 LAB — PROLACTIN: Prolactin: 14.8 ng/mL (ref 4.8–33.4)

## 2024-02-20 MED ORDER — GLUCERNA SHAKE PO LIQD
237.0000 mL | ORAL | Status: DC
  Administered 2024-02-21 – 2024-02-25 (×5): 237 mL via ORAL
  Filled 2024-02-20 (×5): qty 237

## 2024-02-20 MED ORDER — NALTREXONE HCL 50 MG PO TABS
50.0000 mg | ORAL_TABLET | Freq: Every day | ORAL | Status: DC
  Administered 2024-02-21 – 2024-02-25 (×5): 50 mg via ORAL
  Filled 2024-02-20 (×5): qty 1

## 2024-02-20 NOTE — Discharge Instructions (Addendum)
 [facility] Care Management...   Base on the information you have provided and the presenting issue, outpatient services mental health therapy and medication management services have been recommended.  It is imperative that you follow through with treatment recommendations within 5-7 days from the of discharge to mitigate further risk to your safety and mental well-being. In case of an urgent crisis, you may contact the Mobile Crisis Unit with [facility] at [phone].   Patient requested out patient services.   Patient has a virtual appointment with psychiatrist Geralene Kaiser March 04, 2024 @ 11:30 am for Medication Management   Patient will discharge Wednesday 02/25/2024   [facility]  Patient has a(n) 8:30 am appointment Thursday 02/26/2024 [facility] Ad[facility] [address], [place], [place] [address] [phone]   Patients husband will pick up after he gets off work

## 2024-02-20 NOTE — ED Notes (Signed)
 Pt is sleeping, no acute distress noted. Q15 min safety checks continued.

## 2024-02-20 NOTE — [facility] Group Notes (Signed)
 Spiritual Care and Counseling Group Note  02/20/2024 2:45pm  Facilitated by: Librada Donnice Lin   Type of Therapy and Topic:  Hope    Participation Level:  Active  Description of Group:  Group focused on topic of hope.  Patients participated in facilitated discussion around topic, connecting with one another around experiences and definitions for hope.  Group members engaged with group word cloud.  Members selected an image of what hope looks like for them today.  Group engaged in discussion around how their definitions of hope are present today in hospital.      Summary of Patient Progress: Present throughout group. Actively engaged in group discussion, noting she finds hope in action.  Group discussed her reaching out to others to serve as accountability partners as well as reconnecting with faith community     Therapeutic Modalities: Psycho-social ed, Adlerian, Narrative, MI   Librada Donnice Lin, Chaplain 02/20/2024 4:52 PM

## 2024-02-20 NOTE — Group Note (Signed)
 Group Topic: Positive Affirmations  Group Date: 02/20/2024 Start Time: 1930 End Time: 2000 Facilitators: Verdon Jacqualyn BRAVO, NT  Department: [facility]  Number of Participants: 6  Group Focus: daily focus Treatment Modality:  Individual Therapy Interventions utilized were group exercise Purpose: express feelings  Name: Jennifer Mayo Date of Birth: 03/16/1976  MR: [id number]    Level of Participation: active Quality of Participation: cooperative Interactions with others: gave feedback Mood/Affect: appropriate Triggers (if applicable): n/a Cognition: coherent/clear Progress: Moderate Response: n/a Plan: follow-up needed  Patients Problems:  Patient Active Problem List   Diagnosis Date Noted   Alcohol use disorder, severe, dependence (HCC) 02/19/2024   Alcohol use disorder 02/18/2024   Murmur 09/27/2022   Family history of breast cancer in mother 09/26/2022   FH: stroke 09/26/2022   FH: heart disease 09/26/2022   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

## 2024-02-20 NOTE — Group Note (Signed)
 Group Topic: Healthy Self Image and Positive Change  Group Date: 02/20/2024 Start Time: 1430 End Time: 1500 Facilitators: Lonzell Dwayne RAMAN, NT  Department: [facility]  Number of Participants: 3  Group Focus: acceptance Treatment Modality:  Cognitive Behavioral Therapy Interventions utilized were patient education Purpose: explore maladaptive thinking  Name: Shriya Aker Robertshaw Date of Birth: January 17, 1976  MR: [id number]    Level of Participation: Patient did attend group Quality of Participation: attentive Interactions with others: gave feedback Mood/Affect: positive Triggers (if applicable): N/A Cognition: coherent/clear Progress: Moderate Response: Appropriate  Plan: follow-up needed  Patients Problems:  Patient Active Problem List   Diagnosis Date Noted   Alcohol use disorder, severe, dependence (HCC) 02/19/2024   Alcohol use disorder 02/18/2024   Murmur 09/27/2022   Family history of breast cancer in mother 09/26/2022   FH: stroke 09/26/2022   FH: heart disease 09/26/2022   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

## 2024-02-20 NOTE — ED Provider Notes (Signed)
 Behavioral Health Progress Note  Date and Time: 02/20/2024 7:39 PM Name: Jennifer Mayo MRN:  [id number]  Subjective:  Pt reports that she is feeing better mood is 5/10. She is feeling tired and anxious. Appetite is low and she is having some nausea and diarrhea. She reports having some tremors. She is on the librium taper and her CIWAs have been at zero. She complains about new rules that were not happening earlier this week. She could not wear the blanket she had been wearing.   Pt reports later that she developed diarrhea due to the food provided and she did not have access to coffee. Discussed naltrexone for continued sobriety. Pt's husband came and discussed with him treatment and the limitations of a special diet.   Diagnosis:  Final diagnoses:  Alcohol use disorder, severe, dependence (HCC)    Total Time spent with patient: 45 minutes  Past Psychiatric History: MDD, GAD, PTSD and ADHD. No psych hospitalizations or suicide attempts.  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 She lives with her husband and 2 adult sons  Sleep: Good  Appetite:  Poor  Current Medications:  Current Facility-Administered Medications  Medication Dose Route Frequency Provider Last Rate Last Admin   acetaminophen  (TYLENOL ) tablet 650 mg  650 mg Oral Q6H PRN Onuoha, Chinwendu V, NP       alum & mag hydroxide-simeth (MAALOX/MYLANTA) 200-200-20 MG/5ML suspension 30 mL  30 mL Oral Q4H PRN Onuoha, Chinwendu V, NP       amLODipine  (NORVASC ) tablet 5 mg  5 mg Oral QHS Onuoha, Chinwendu V, NP   5 mg at 02/19/24 2123   chlordiazePOXIDE (LIBRIUM) capsule 25 mg  25 mg Oral Q6H PRN Onuoha, Chinwendu V, NP       chlordiazePOXIDE (LIBRIUM) capsule 25 mg   25 mg Oral TID Onuoha, Chinwendu V, NP   25 mg at 02/20/24 1511   Followed by   NOREEN ON 02/21/2024] chlordiazePOXIDE (LIBRIUM) capsule 25 mg  25 mg Oral BH-qamhs Onuoha, Chinwendu V, NP       Followed by   NOREEN ON 02/22/2024] chlordiazePOXIDE (LIBRIUM) capsule 25 mg  25 mg Oral Daily Onuoha, Chinwendu V, NP       haloperidol (HALDOL) tablet 5 mg  5 mg Oral TID PRN Onuoha, Chinwendu V, NP       And   diphenhydrAMINE  (BENADRYL ) capsule 50 mg  50 mg Oral TID PRN Onuoha, Chinwendu V, NP       haloperidol lactate (HALDOL) injection 5 mg  5 mg Intramuscular TID PRN Onuoha, Chinwendu V, NP       And   diphenhydrAMINE  (BENADRYL ) injection 50 mg  50 mg Intramuscular TID PRN Onuoha, Chinwendu V, NP       And   LORazepam (ATIVAN) injection 2 mg  2 mg Intramuscular TID PRN Onuoha, Chinwendu V, NP       haloperidol lactate (HALDOL) injection 10 mg  10 mg Intramuscular TID PRN Onuoha, Chinwendu V, NP       And   diphenhydrAMINE  (BENADRYL ) injection 50 mg  50 mg Intramuscular TID PRN Onuoha, Chinwendu V, NP       And   LORazepam (ATIVAN) injection 2 mg  2 mg Intramuscular TID PRN Onuoha, Chinwendu V, NP       [  START ON 02/21/2024] feeding supplement (GLUCERNA SHAKE) (GLUCERNA SHAKE) liquid 237 mL  237 mL Oral Q24H Omarie Parcell J, MD       FLUoxetine  (PROZAC ) capsule 20 mg  20 mg Oral Daily Lawrnce, Elizbeth Posa J, MD   20 mg at 02/20/24 9050   hydrOXYzine (ATARAX) tablet 25 mg  25 mg Oral Q6H PRN Onuoha, Chinwendu V, NP   25 mg at 02/20/24 1513   loperamide (IMODIUM) capsule 2-4 mg  2-4 mg Oral PRN Caden Fatica J, MD       magnesium hydroxide (MILK OF MAGNESIA) suspension 30 mL  30 mL Oral Daily PRN Onuoha, Chinwendu V, NP       melatonin tablet 5 mg  5 mg Oral QHS PRN Onuoha, Chinwendu V, NP       multivitamin with minerals tablet 1 tablet  1 tablet Oral Daily Onuoha, Chinwendu V, NP   1 tablet at 02/20/24 0949   [START ON 02/21/2024] naltrexone (DEPADE) tablet 50 mg  50 mg Oral Daily Nayla Dias  J, MD       ondansetron  (ZOFRAN -ODT) disintegrating tablet 4 mg  4 mg Oral Q6H PRN Onuoha, Chinwendu V, NP   4 mg at 02/19/24 1002   Current Outpatient Medications  Medication Sig Dispense Refill   AIMOVIG  140 MG/ML SOAJ Inject 140 mg into the skin every 30 (thirty) days.     amLODipine  (NORVASC ) 2.5 MG tablet Take 1 tablet (2.5 mg total) by mouth 2 (two) times daily. (Patient taking differently: Take 5 mg by mouth at bedtime.) 180 tablet 1   FLUoxetine  (PROZAC ) 10 MG capsule Take 1 capsule (10 mg total) by mouth daily. (Patient taking differently: Take 20 mg by mouth at bedtime.) 30 capsule 0   Multiple Vitamins-Minerals (BARIATRIC MULTIVITAMINS/IRON  PO) Take 1 tablet by mouth daily.     Multiple Vitamins-Minerals (HAIR SKIN & NAILS PO) Take 2 tablets by mouth daily.     ondansetron  (ZOFRAN -ODT) 4 MG disintegrating tablet Take 4-8 mg by mouth every 8 (eight) hours as needed for nausea or vomiting.     OVER THE COUNTER MEDICATION Take 1 tablet by mouth in the morning, at noon, and at bedtime. Calcium  with Vitamin D  Chews      Labs  Lab Results:  Admission on 02/18/2024, Discharged on 02/18/2024  Component Date Value Ref Range Status   WBC 02/18/2024 2.4 (L)  4.0 - 10.5 K/uL Final   RBC 02/18/2024 4.44  3.87 - 5.11 MIL/uL Final   Hemoglobin 02/18/2024 13.8  12.0 - 15.0 g/dL Final   HCT 89/70/7974 41.8  36.0 - 46.0 % Final   MCV 02/18/2024 94.1  80.0 - 100.0 fL Final   MCH 02/18/2024 31.1  26.0 - 34.0 pg Final   MCHC 02/18/2024 33.0  30.0 - 36.0 g/dL Final   RDW 89/70/7974 14.2  11.5 - 15.5 % Final   Platelets 02/18/2024 118 (L)  150 - 400 K/uL Final   nRBC 02/18/2024 0.0  0.0 - 0.2 % Final   Neutrophils Relative % 02/18/2024 59  % Final   Neutro Abs 02/18/2024 1.4 (L)  1.7 - 7.7 K/uL Final   Lymphocytes Relative 02/18/2024 29  % Final   Lymphs Abs 02/18/2024 0.7  0.7 - 4.0 K/uL Final   Monocytes Relative 02/18/2024 11  % Final   Monocytes Absolute 02/18/2024 0.3  0.1 - 1.0 K/uL Final    Eosinophils Relative 02/18/2024 0  % Final   Eosinophils Absolute 02/18/2024 0.0  0.0 - 0.5 K/uL  Final   Basophils Relative 02/18/2024 1  % Final   Basophils Absolute 02/18/2024 0.0  0.0 - 0.1 K/uL Final   Immature Granulocytes 02/18/2024 0  % Final   Abs Immature Granulocytes 02/18/2024 0.01  0.00 - 0.07 K/uL Final   Performed at [facility], [address] [place], [place] [address]   Sodium 02/18/2024 143  135 - 145 mmol/L Final   Potassium 02/18/2024 4.2  3.5 - 5.1 mmol/L Final   Chloride 02/18/2024 100  98 - 111 mmol/L Final   CO2 02/18/2024 26  22 - 32 mmol/L Final   Glucose, Bld 02/18/2024 88  70 - 99 mg/dL Final   Glucose reference range applies only to samples taken after fasting for at least 8 hours.   BUN 02/18/2024 7  6 - 20 mg/dL Final   Creatinine, Ser 02/18/2024 0.56  0.44 - 1.00 mg/dL Final   Calcium  02/18/2024 8.8 (L)  8.9 - 10.3 mg/dL Final   Total Protein 89/70/7974 6.4 (L)  6.5 - 8.1 g/dL Final   Albumin 89/70/7974 3.6  3.5 - 5.0 g/dL Final   AST 89/70/7974 62 (H)  15 - 41 U/L Final   ALT 02/18/2024 66 (H)  0 - 44 U/L Final   Alkaline Phosphatase 02/18/2024 92  38 - 126 U/L Final   Total Bilirubin 02/18/2024 0.5  0.0 - 1.2 mg/dL Final   GFR, Estimated 02/18/2024 >60  >60 mL/min Final   Comment: (NOTE) Calculated using the CKD-EPI Creatinine Equation (2021)    Anion gap 02/18/2024 17 (H)  5 - 15 Final   Performed at [facility], [address], [place], [place] [address]   Magnesium 02/18/2024 2.1  1.7 - 2.4 mg/dL Final   Performed at [facility], [address], [place], [place] [address]   Alcohol, Ethyl (B) 02/18/2024 153 (H)  <15 mg/dL Final   Comment: (NOTE) For medical purposes only. Performed at [facility], [address] [place], [place] [address]    TSH 02/18/2024 2.357  0.350 - 4.500 uIU/mL Final   Comment: Performed by a 3rd Generation assay with a functional sensitivity of <=0.01 uIU/mL. Performed at [facility], [address] [place], [place] [address]    Prolactin 02/18/2024 14.8  4.8 - 33.4 ng/mL Final   Comment: (NOTE) Performed At: [facility] [address] [place], [place] [id number] Jennette Shorter MD Ey:[id number]    Preg Test, Ur 02/18/2024 Negative  Negative Final   POC Amphetamine UR 02/18/2024 None Detected  NONE DETECTED (Cut Off Level 1000 ng/mL) Final   POC Secobarbital (BAR) 02/18/2024 None Detected  NONE DETECTED (Cut Off Level 300 ng/mL) Final   POC Buprenorphine (BUP) 02/18/2024 None Detected  NONE DETECTED (Cut Off Level 10 ng/mL) Final   POC Oxazepam (BZO) 02/18/2024 None Detected  NONE DETECTED (Cut Off Level 300 ng/mL) Final   POC Cocaine UR 02/18/2024 None Detected  NONE DETECTED (Cut Off Level 300 ng/mL) Final   POC Methamphetamine UR 02/18/2024 None Detected  NONE DETECTED (Cut Off Level 1000 ng/mL) Final   POC Morphine  02/18/2024 None Detected  NONE DETECTED (Cut Off Level 300 ng/mL) Final   POC Methadone UR 02/18/2024 None Detected  NONE DETECTED (Cut Off Level 300 ng/mL) Final   POC Oxycodone  UR 02/18/2024 None Detected  NONE DETECTED (Cut Off Level 100 ng/mL) Final   POC Marijuana UR 02/18/2024 None Detected  NONE DETECTED (Cut Off Level 50 ng/mL) Final  Preg Test, Ur 02/18/2024 NEGATIVE  NEGATIVE Final   Comment:        THE SENSITIVITY OF THIS METHODOLOGY IS >20 mIU/mL.     Blood Alcohol level:  Lab Results  Component Value Date   ETH 153 (H) 02/18/2024   ETH 265 (H) 11/30/2022    Metabolic Disorder Labs: Lab Results  Component Value Date   HGBA1C 5.1 09/26/2022   MPG 102.54 11/20/2017   Lab Results  Component Value Date   PROLACTIN 14.8 02/18/2024   Lab Results  Component Value Date   CHOL 188 09/26/2022   TRIG 85.0 09/26/2022   HDL 121.30 09/26/2022   CHOLHDL 2 09/26/2022   VLDL 17.0 09/26/2022   LDLCALC 49 09/26/2022   LDLCALC 72 11/20/2017    Therapeutic Lab Levels: No results found for: LITHIUM No results found  for: VALPROATE No results found for: CBMZ  Physical Findings   GAD-7    Flowsheet Row Office Visit from 09/26/2022 in [facility] [place] Office Visit from 08/26/2022 in [facility]  Total GAD-7 Score 8 8   PHQ2-9    Flowsheet Row ED from 02/19/2024 in [facility] ED from 02/18/2024 in [facility] Office Visit from 02/12/2024 in [facility] Office Visit from 09/26/2022 in [facility] [place] Office Visit from 08/26/2022 in [facility] [place]  PHQ-2 Total Score 5 5 2 4 1   PHQ-9 Total Score 18 18 16 15 14    Flowsheet Row ED from 02/18/2024 in [facility] at [facility] Most recent reading at 02/18/2024  1:35 PM ED from 02/18/2024 in [facility] Most recent reading at 02/18/2024 10:11 AM ED from 02/18/2024 in [facility] Most recent reading at 02/18/2024  7:35 AM  C-SSRS RISK CATEGORY No Risk No Risk No Risk     Musculoskeletal  Strength & Muscle Tone: within normal limits Gait & Station: normal Patient leans: N/A  Psychiatric Specialty Exam  Presentation  General Appearance:  Appropriate for Environment  Eye Contact: Good  Speech: Normal Rate  Speech Volume: Normal  Handedness: Right   Mood and Affect  Mood: Depressed  Affect: Congruent   Thought Process  Thought Processes: Coherent  Descriptions of Associations:Intact  Orientation:Full (Time, Place and Person)  Thought Content:Abstract Reasoning  Diagnosis of Schizophrenia or Schizoaffective disorder in past: No    Hallucinations:Hallucinations: None  Ideas of Reference:None  Suicidal Thoughts:Suicidal Thoughts: No  Homicidal Thoughts:Homicidal Thoughts: No   Sensorium  Memory: Recent Good;  Immediate Good; Remote Good  Judgment: Intact  Insight: Good   Executive Functions  Concentration: Good  Attention Span: Good  Recall: Good  Fund of Knowledge: Good  Language: Good   Psychomotor Activity  Psychomotor Activity: Psychomotor Activity: Extrapyramidal Side Effects (EPS) Extrapyramidal Side Effects (EPS): Akathisia   Assets  Assets: Communication Skills; Desire for Improvement; Financial Resources/Insurance; Leisure Time   Sleep  Sleep: Sleep: Good  Estimated Sleeping Duration (Last 24 Hours): 12.00 hours  Nutritional Assessment (For OBS and FBC admissions only) Has the patient had a weight loss or gain of 10 pounds or more in the last 3 months?: No Has the patient had a decrease in food intake/or appetite?: No Does the patient have dental problems?: No Does the patient have eating habits or behaviors that may be indicators of an  eating disorder including binging or inducing vomiting?: No Has the patient recently lost weight without trying?: 0 Has the patient been eating poorly because of a decreased appetite?: 0 Malnutrition Screening Tool Score: 0    Physical Exam  Physical Exam Vitals and nursing note reviewed.  HENT:     Head: Normocephalic and atraumatic.     Right Ear: External ear normal.     Left Ear: External ear normal.  Eyes:     Conjunctiva/sclera: Conjunctivae normal.  Pulmonary:     Effort: Pulmonary effort is normal.  Musculoskeletal:        General: Normal range of motion.     Cervical back: Normal range of motion.  Neurological:     Mental Status: She is alert and oriented to person, place, and time.    Review of Systems  Constitutional:  Negative for chills, fever and weight loss.  HENT:  Negative for hearing loss and tinnitus.   Eyes:  Negative for blurred vision and double vision.  Cardiovascular:  Negative for chest pain and palpitations.  Gastrointestinal:  Positive for diarrhea and nausea. Negative for  heartburn.  Genitourinary:  Negative for dysuria and urgency.  Musculoskeletal:  Negative for myalgias and neck pain.  Skin:  Negative for itching and rash.  Neurological:  Positive for tremors. Negative for dizziness and headaches.  Psychiatric/Behavioral:  Positive for substance abuse. Negative for depression, hallucinations, memory loss and suicidal ideas. The patient is not nervous/anxious and does not have insomnia.    Blood pressure 130/85, pulse 89, temperature 97.9 F (36.6 C), temperature source Oral, resp. rate 18, SpO2 95%. There is no height or weight on file to calculate BMI.  Treatment Plan Summary: Daily contact with patient to assess and evaluate symptoms and progress in treatment 48 year old female patient with a family history of mood disorder and with a personal psychiatric history of MDD, GAD, PTSD and ADHD and medical history of gastric by-pass surgery, HTN, cardiac disease, B12 def, Narcolepsy, and hemiplegic migraines. 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 She presents for alcohol detox.   Gastric By-pass Vitamins related to decreased nutrient absorption and glucerna meal supplement in the morning   HTN  Continue amlodipine    Alcohol dep CIWA protocol Start Naltrexone 50 mg daily   MDD and GAD and PTSD Prozac  20 mg   Social work arranging outpatient substance treatment and psychiatric care.   Daily contact. Pt will likely discharge 11/3 if withdrawal/detox protocol is completed without further complications. Garvin JINNY Gaines, MD 02/20/2024 7:39 PM

## 2024-02-20 NOTE — ED Notes (Signed)
 Patient asleep in the bedroom, NAD Environment secured per policy, Q15 Checks in place.

## 2024-02-20 NOTE — Group Note (Signed)
 Group Topic: Decisional Balance/Substance Abuse  Group Date: 02/20/2024 Start Time: 1230 End Time: 1300 Facilitators: Stanly Stabile, RN  [facility]  Number of Participants: 6  Group Focus: chemical dependency education and chemical dependency issues Treatment Modality:  Solution-Focused Therapy Interventions utilized were patient education Purpose: express feelings, express irrational fears, improve communication skills, increase insight, regain self-worth, reinforce self-care, relapse prevention strategies, and trigger / craving management  Name: Jennifer Mayo Date of Birth: [birth year]  MR: [id number]    Level of Participation: moderate Quality of Participation: attentive and cooperative Interactions with others: gave feedback Mood/Affect: appropriate Triggers (if applicable):   Cognition: coherent/clear and concrete Progress: Gaining insight Response:   Plan: follow-up needed  Patients Problems:  Patient Active Problem List   Diagnosis Date Noted   Alcohol use disorder, severe, dependence (HCC) 02/19/2024   Alcohol use disorder 02/18/2024   Murmur 09/27/2022   Family history of breast cancer in mother 09/26/2022   FH: stroke 09/26/2022   FH: heart disease 09/26/2022   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

## 2024-02-21 MED ORDER — FLUOXETINE HCL 20 MG PO CAPS
20.0000 mg | ORAL_CAPSULE | Freq: Once | ORAL | Status: AC
  Administered 2024-02-21: 20 mg via ORAL
  Filled 2024-02-21: qty 1

## 2024-02-21 MED ORDER — FLUOXETINE HCL 20 MG PO CAPS
40.0000 mg | ORAL_CAPSULE | Freq: Every day | ORAL | Status: DC
  Administered 2024-02-22 – 2024-02-24 (×3): 40 mg via ORAL
  Filled 2024-02-21 (×3): qty 2

## 2024-02-21 NOTE — Progress Notes (Signed)
 Patient is alert and oriented x 4 with no acute distress noted.  Patient was observed in the dayroom watching TV, and also attended AA meeting.  Patient currently denies SI/HI/AVH at this time.  She requested PRN Imodium for diarrhea, with no other concerns voiced at this time.  Patient was compliant with scheduled medications during shift.  No other issues noted at this time.  Will continue to monitor to monitor with safety checks Q 15 min and monitor for safety/behavior.

## 2024-02-21 NOTE — ED Provider Notes (Signed)
 Behavioral Health Progress Note  Date and Time: 02/21/2024 1:20 PM Name: Jennifer Mayo MRN:  [id number]  Subjective:  Pt reports that she had a headache this morning along with tremors, dizziness, nausea and diarrhea. She reports having bouts of dumping due to not having her usual high protein diet. She sleeps too much during the day and her appetite is down. She feels depressed. When she is alone and has these feelings is when she drinks. Mood she reports as a 4/10. This is her first ever detox, alcohol treatment. She has never been to AA. She has never addressed the alcohol issue. She feels good when she is at work. She enjoys her work. Otherwise when alone she sleeps or drinks. She takes care of others but does not take care of herself.  Diagnosis:  Final diagnoses:  Alcohol use disorder, severe, dependence (HCC)    Total Time spent with patient: 30 minutes  Past Psychiatric History: MDD, GAD, PTSD and ADHD. No psych hospitalizations or suicide attempts.  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 She lives with her husband and 2 adult sons  Sleep: Good too much  Appetite:  Poor  Current Medications:  Current Facility-Administered Medications  Medication Dose Route Frequency Provider Last Rate Last Admin   acetaminophen  (TYLENOL ) tablet 650 mg  650 mg Oral Q6H PRN Onuoha, Chinwendu V, NP       alum & mag hydroxide-simeth (MAALOX/MYLANTA) 200-200-20 MG/5ML suspension 30 mL  30 mL Oral Q4H PRN Onuoha, Chinwendu V, NP       amLODipine  (NORVASC ) tablet 5 mg  5 mg Oral QHS Onuoha, Chinwendu V, NP   5 mg at 02/20/24 2116   chlordiazePOXIDE (LIBRIUM) capsule 25 mg  25 mg Oral Q6H PRN Onuoha, Chinwendu V, NP        chlordiazePOXIDE (LIBRIUM) capsule 25 mg  25 mg Oral BH-qamhs Onuoha, Chinwendu V, NP   25 mg at 02/21/24 9090   Followed by   NOREEN ON 02/22/2024] chlordiazePOXIDE (LIBRIUM) capsule 25 mg  25 mg Oral Daily Onuoha, Chinwendu V, NP       haloperidol (HALDOL) tablet 5 mg  5 mg Oral TID PRN Onuoha, Chinwendu V, NP       And   diphenhydrAMINE  (BENADRYL ) capsule 50 mg  50 mg Oral TID PRN Onuoha, Chinwendu V, NP       haloperidol lactate (HALDOL) injection 5 mg  5 mg Intramuscular TID PRN Onuoha, Chinwendu V, NP       And   diphenhydrAMINE  (BENADRYL ) injection 50 mg  50 mg Intramuscular TID PRN Onuoha, Chinwendu V, NP       And   LORazepam (ATIVAN) injection 2 mg  2 mg Intramuscular TID PRN Onuoha, Chinwendu V, NP       haloperidol lactate (HALDOL) injection 10 mg  10 mg Intramuscular TID PRN Onuoha, Chinwendu V, NP       And   diphenhydrAMINE  (BENADRYL ) injection 50 mg  50 mg Intramuscular TID PRN Onuoha, Chinwendu V, NP       And   LORazepam (ATIVAN) injection 2 mg  2 mg Intramuscular TID PRN Onuoha, Chinwendu V, NP       feeding supplement (GLUCERNA SHAKE) (GLUCERNA SHAKE) liquid 237 mL  237 mL Oral  Q24H Alic Hilburn J, MD   237 mL at 02/21/24 9180   FLUoxetine  (PROZAC ) capsule 20 mg  20 mg Oral Once Katelyn Broadnax J, MD       [START ON 02/22/2024] FLUoxetine  (PROZAC ) capsule 40 mg  40 mg Oral Daily Allura Doepke J, MD       hydrOXYzine (ATARAX) tablet 25 mg  25 mg Oral Q6H PRN Onuoha, Chinwendu V, NP   25 mg at 02/21/24 0903   loperamide (IMODIUM) capsule 2-4 mg  2-4 mg Oral PRN Susannah Carbin J, MD       magnesium hydroxide (MILK OF MAGNESIA) suspension 30 mL  30 mL Oral Daily PRN Onuoha, Chinwendu V, NP       melatonin tablet 5 mg  5 mg Oral QHS PRN Onuoha, Chinwendu V, NP       multivitamin with minerals tablet 1 tablet  1 tablet Oral Daily Onuoha, Chinwendu V, NP   1 tablet at 02/21/24 0903   naltrexone (DEPADE) tablet 50 mg  50 mg Oral Daily Dezmen Alcock J, MD   50 mg at  02/21/24 9096   ondansetron  (ZOFRAN -ODT) disintegrating tablet 4 mg  4 mg Oral Q6H PRN Onuoha, Chinwendu V, NP   4 mg at 02/21/24 0903   Current Outpatient Medications  Medication Sig Dispense Refill   AIMOVIG  140 MG/ML SOAJ Inject 140 mg into the skin every 30 (thirty) days.     amLODipine  (NORVASC ) 2.5 MG tablet Take 1 tablet (2.5 mg total) by mouth 2 (two) times daily. (Patient taking differently: Take 5 mg by mouth at bedtime.) 180 tablet 1   FLUoxetine  (PROZAC ) 10 MG capsule Take 1 capsule (10 mg total) by mouth daily. (Patient taking differently: Take 20 mg by mouth at bedtime.) 30 capsule 0   Multiple Vitamins-Minerals (BARIATRIC MULTIVITAMINS/IRON  PO) Take 1 tablet by mouth daily.     Multiple Vitamins-Minerals (HAIR SKIN & NAILS PO) Take 2 tablets by mouth daily.     ondansetron  (ZOFRAN -ODT) 4 MG disintegrating tablet Take 4-8 mg by mouth every 8 (eight) hours as needed for nausea or vomiting.     OVER THE COUNTER MEDICATION Take 1 tablet by mouth in the morning, at noon, and at bedtime. Calcium  with Vitamin D  Chews      Labs  Lab Results:  Admission on 02/18/2024, Discharged on 02/18/2024  Component Date Value Ref Range Status   WBC 02/18/2024 2.4 (L)  4.0 - 10.5 K/uL Final   RBC 02/18/2024 4.44  3.87 - 5.11 MIL/uL Final   Hemoglobin 02/18/2024 13.8  12.0 - 15.0 g/dL Final   HCT 89/70/7974 41.8  36.0 - 46.0 % Final   MCV 02/18/2024 94.1  80.0 - 100.0 fL Final   MCH 02/18/2024 31.1  26.0 - 34.0 pg Final   MCHC 02/18/2024 33.0  30.0 - 36.0 g/dL Final   RDW 89/70/7974 14.2  11.5 - 15.5 % Final   Platelets 02/18/2024 118 (L)  150 - 400 K/uL Final   nRBC 02/18/2024 0.0  0.0 - 0.2 % Final   Neutrophils Relative % 02/18/2024 59  % Final   Neutro Abs 02/18/2024 1.4 (L)  1.7 - 7.7 K/uL Final   Lymphocytes Relative 02/18/2024 29  % Final   Lymphs Abs 02/18/2024 0.7  0.7 - 4.0 K/uL Final   Monocytes Relative 02/18/2024 11  % Final   Monocytes Absolute 02/18/2024 0.3  0.1 - 1.0 K/uL  Final   Eosinophils Relative 02/18/2024 0  % Final   Eosinophils Absolute  02/18/2024 0.0  0.0 - 0.5 K/uL Final   Basophils Relative 02/18/2024 1  % Final   Basophils Absolute 02/18/2024 0.0  0.0 - 0.1 K/uL Final   Immature Granulocytes 02/18/2024 0  % Final   Abs Immature Granulocytes 02/18/2024 0.01  0.00 - 0.07 K/uL Final   Performed at [facility], [address] [place], [place] [address]   Sodium 02/18/2024 143  135 - 145 mmol/L Final   Potassium 02/18/2024 4.2  3.5 - 5.1 mmol/L Final   Chloride 02/18/2024 100  98 - 111 mmol/L Final   CO2 02/18/2024 26  22 - 32 mmol/L Final   Glucose, Bld 02/18/2024 88  70 - 99 mg/dL Final   Glucose reference range applies only to samples taken after fasting for at least 8 hours.   BUN 02/18/2024 7  6 - 20 mg/dL Final   Creatinine, Ser 02/18/2024 0.56  0.44 - 1.00 mg/dL Final   Calcium  02/18/2024 8.8 (L)  8.9 - 10.3 mg/dL Final   Total Protein 89/70/7974 6.4 (L)  6.5 - 8.1 g/dL Final   Albumin 89/70/7974 3.6  3.5 - 5.0 g/dL Final   AST 89/70/7974 62 (H)  15 - 41 U/L Final   ALT 02/18/2024 66 (H)  0 - 44 U/L Final   Alkaline Phosphatase 02/18/2024 92  38 - 126 U/L Final   Total Bilirubin 02/18/2024 0.5  0.0 - 1.2 mg/dL Final   GFR, Estimated 02/18/2024 >60  >60 mL/min Final   Comment: (NOTE) Calculated using the CKD-EPI Creatinine Equation (2021)    Anion gap 02/18/2024 17 (H)  5 - 15 Final   Performed at [facility], [address] [place], [place] [address]   Magnesium 02/18/2024 2.1  1.7 - 2.4 mg/dL Final   Performed at [facility], [address] [place], [place] [address]   Alcohol, Ethyl (B) 02/18/2024 153 (H)  <15 mg/dL Final   Comment: (NOTE) For medical purposes only. Performed at [facility], [address] [place], [place] [address]    TSH 02/18/2024 2.357  0.350 - 4.500 uIU/mL Final   Comment: Performed by a 3rd Generation assay with a functional sensitivity of <=0.01 uIU/mL. Performed at [facility], [address], [place], [place] [address]    Prolactin 02/18/2024 14.8  4.8 - 33.4 ng/mL Final   Comment: (NOTE) Performed At: [facility] [address] [place], [place] [id number] Jennette Shorter MD Ey:[id number]    Preg Test, Ur 02/18/2024 Negative  Negative Final   POC Amphetamine UR 02/18/2024 None Detected  NONE DETECTED (Cut Off Level 1000 ng/mL) Final   POC Secobarbital (BAR) 02/18/2024 None Detected  NONE DETECTED (Cut Off Level 300 ng/mL) Final   POC Buprenorphine (BUP) 02/18/2024 None Detected  NONE DETECTED (Cut Off Level 10 ng/mL) Final   POC Oxazepam (BZO) 02/18/2024 None Detected  NONE DETECTED (Cut Off Level 300 ng/mL) Final   POC Cocaine UR 02/18/2024 None Detected  NONE DETECTED (Cut Off Level 300 ng/mL) Final   POC Methamphetamine UR 02/18/2024 None Detected  NONE DETECTED (Cut Off Level 1000 ng/mL) Final   POC Morphine  02/18/2024 None Detected  NONE DETECTED (Cut Off Level 300 ng/mL) Final   POC Methadone UR 02/18/2024 None Detected  NONE DETECTED (Cut Off Level 300 ng/mL) Final   POC Oxycodone  UR 02/18/2024 None Detected  NONE DETECTED (Cut Off Level 100 ng/mL) Final   POC Marijuana UR 02/18/2024 None Detected  NONE  DETECTED (Cut Off Level 50 ng/mL) Final   Preg Test, Ur 02/18/2024 NEGATIVE  NEGATIVE Final   Comment:        THE SENSITIVITY OF THIS METHODOLOGY IS >20 mIU/mL.     Blood Alcohol level:  Lab Results  Component Value Date   ETH 153 (H) 02/18/2024   ETH 265 (H) 11/30/2022    Metabolic Disorder Labs: Lab Results  Component Value Date   HGBA1C 5.1 09/26/2022   MPG 102.54 11/20/2017   Lab Results  Component Value Date   PROLACTIN 14.8 02/18/2024   Lab Results  Component Value Date   CHOL 188 09/26/2022   TRIG 85.0 09/26/2022   HDL 121.30 09/26/2022   CHOLHDL 2 09/26/2022   VLDL 17.0 09/26/2022   LDLCALC 49 09/26/2022   LDLCALC 72 11/20/2017    Therapeutic Lab Levels: No results found for: LITHIUM No results  found for: VALPROATE No results found for: CBMZ  Physical Findings   GAD-7    Flowsheet Row Office Visit from 09/26/2022 in [facility] [place] Office Visit from 08/26/2022 in [facility]  Total GAD-7 Score 8 8   PHQ2-9    Flowsheet Row ED from 02/19/2024 in [facility] ED from 02/18/2024 in [facility] Office Visit from 02/12/2024 in [facility] Office Visit from 09/26/2022 in [facility] [place] Office Visit from 08/26/2022 in [facility] [place]  PHQ-2 Total Score 5 5 2 4 1   PHQ-9 Total Score 18 18 16 15 14    Flowsheet Row ED from 02/18/2024 in [facility] Emergency Department at [facility] Most recent reading at 02/18/2024  1:35 PM ED from 02/18/2024 in [facility] Most recent reading at 02/18/2024 10:11 AM ED from 02/18/2024 in [facility] Most recent reading at 02/18/2024  7:35 AM  C-SSRS RISK CATEGORY No Risk No Risk No Risk     Musculoskeletal  Strength & Muscle Tone: within normal limits Gait & Station: normal Patient leans: N/A  Psychiatric Specialty Exam  Presentation  General Appearance:  Appropriate for Environment  Eye Contact: Good  Speech: Normal Rate  Speech Volume: Normal  Handedness: Right   Mood and Affect  Mood: Depressed  Affect: Congruent   Thought Process  Thought Processes: Goal Directed  Descriptions of Associations:Intact  Orientation:Full (Time, Place and Person)  Thought Content:Logical  Diagnosis of Schizophrenia or Schizoaffective disorder in past: No    Hallucinations:Hallucinations: None  Ideas of Reference:None  Suicidal Thoughts:Suicidal Thoughts: No  Homicidal Thoughts:Homicidal Thoughts: No   Sensorium  Memory: Immediate Good;  Remote Good; Recent Good  Judgment: Intact  Insight: Good   Executive Functions  Concentration: Good  Attention Span: Good  Recall: Good  Fund of Knowledge: Good  Language: Good   Psychomotor Activity  Psychomotor Activity: Psychomotor Activity: Normal Extrapyramidal Side Effects (EPS): Other (comment) (no EPS)   Assets  Assets: Manufacturing Systems Engineer; Desire for Improvement; Intimacy; Financial Resources/Insurance; Housing; Physical Health; Social Support; Talents/Skills; Transportation; Vocational/Educational   Sleep  Sleep: Sleep: Good  Estimated Sleeping Duration (Last 24 Hours): 11.75-14.25 hours  No data recorded  Physical Exam  Physical Exam Vitals and nursing note reviewed.  HENT:     Head: Normocephalic and atraumatic.     Nose: Nose normal.  Eyes:     Conjunctiva/sclera: Conjunctivae normal.  Pulmonary:  Effort: Pulmonary effort is normal.  Musculoskeletal:        General: Normal range of motion.     Cervical back: Normal range of motion.  Skin:    General: Skin is dry.  Neurological:     Mental Status: She is alert and oriented to person, place, and time.    Review of Systems  Constitutional:  Positive for malaise/fatigue. Negative for fever and weight loss.  HENT:  Negative for hearing loss.   Eyes:  Negative for blurred vision.  Respiratory:  Negative for cough.   Cardiovascular:  Negative for chest pain.  Gastrointestinal:  Positive for diarrhea and nausea.  Genitourinary:  Negative for dysuria.  Musculoskeletal:  Negative for myalgias.  Neurological:  Positive for dizziness, tingling, tremors and headaches.  Psychiatric/Behavioral:  Positive for depression and substance abuse. Negative for hallucinations, memory loss and suicidal ideas. The patient is nervous/anxious. The patient does not have insomnia.    Blood pressure 126/88, pulse 90, temperature 98.5 F (36.9 C), temperature source Oral, resp. rate 18, SpO2 91%. There  is no height or weight on file to calculate BMI.  Treatment Plan Summary: Daily contact with patient to assess and evaluate symptoms and progress in treatment and Medication management Daily contact with patient to assess and evaluate symptoms and progress in treatment 48 year old female patient with a family history of mood disorder and with a personal psychiatric history of MDD, GAD, PTSD and ADHD and medical history of gastric by-pass surgery, HTN, cardiac disease, B12 def, Narcolepsy, and hemiplegic migraines. 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 She presents for alcohol detox.   Gastric By-pass Vitamins related to decreased nutrient absorption and glucerna meal supplement in the morning   HTN  Continue amlodipine    Alcohol dep CIWA protocol Start Naltrexone 50 mg daily Discussed 30 AA meetings in 30 days  Pt has never gone to an AA meeting and does not have a sponsor.    MDD and GAD and PTSD Prozac  20 mg increase to 40 mg Provided psychoeducation   Social work arranging outpatient substance treatment and psychiatric care. Continue to discuss meeting opportunities. Pt is interested in [facility].   Daily contact. Pt will likely discharge 11/3 if withdrawal/detox protocol is completed without further complications. Garvin JINNY Gaines, MD 02/21/2024 1:20 PM

## 2024-02-21 NOTE — Group Note (Signed)
 Group Topic: Overcoming Obstacles  Group Date: 02/21/2024 Start Time: 0100 End Time: 0125 Facilitators: Lonzell Dwayne RAMAN, NT  Department: [facility]  Number of Participants: 1  Group Focus: self-awareness Treatment Modality:  Individual Therapy Interventions utilized were patient education Purpose: enhance coping skills  Name: Destani Wamser [place] Date of Birth: 1975-12-22  MR: [id number]    Level of Participation: Patient did not attend group Quality of Participation: N/A Interactions with others: N/A Mood/Affect: N/A Triggers (if applicable): N/A Cognition: N/A Progress: N/A Response: N/A Plan: N/A  Patients Problems:  Patient Active Problem List   Diagnosis Date Noted   Alcohol use disorder, severe, dependence (HCC) 02/19/2024   Alcohol use disorder 02/18/2024   Murmur 09/27/2022   Family history of breast cancer in mother 09/26/2022   FH: stroke 09/26/2022   FH: heart disease 09/26/2022   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

## 2024-02-21 NOTE — ED Notes (Signed)
 Patient asleep, NAD. will continue to monitor for safety.

## 2024-02-21 NOTE — ED Notes (Signed)
 PRN atarax, zofran , and imodium given due to patient reports of anxiety, nausea, and diarrhea. Medication administered with no complications. Environment secured, safety checks in place per facility policy.

## 2024-02-21 NOTE — ED Notes (Signed)
 Patient alert & oriented x4. Denies intent to harm self or others when asked. Denies A/VH. Patient reports anxiety, nausea, and diarrhea, requesting a double dose of imodium. Medication administered per order instructions. When administering medications computer system showed that the scheduled librium dose was unable to be given, medication held while writer looked into issue. When writer said she could not give librium at this time patient started saying she has real bad tremors, no tremors assessed. Patient became tearful and took other medications with hesitation. No acute distress noted. Support and encouragement provided. Writer was able to get scheduled librium to scan in system as an active medication and administered medication. Patient stated that she needs routine and that the provider will need to consult her prior to changes. Writer explained to patient that orders can be placed without patient permission as that is not how provider orders work. Patient did not respond to writer at this point. Patient observed in milieu. No inappropriate behaviors observed or reported. Routine safety checks conducted per facility protocol. Encouraged patient to notify staff if any thoughts of harm towards self or others arise. Patient verbalizes understanding and agreement.

## 2024-02-21 NOTE — ED Notes (Signed)
 Patient resting with eyes closed in no apparent acute distress. Respirations even and unlabored. Environment secured. Safety checks in place according to facility policy.

## 2024-02-21 NOTE — ED Notes (Signed)
 PRN Zofran ODT given due to patient reports of nausea. Medication administered with no complications. Environment secured, safety checks in place per facility policy.

## 2024-02-21 NOTE — ED Notes (Addendum)
 Pt asleep, CIWA not assessed at this time.

## 2024-02-21 NOTE — Group Note (Signed)
 Group Topic: Social Support  Group Date: 02/21/2024 Start Time: 1200 End Time: 1230 Facilitators: Armel Rabbani, Zane HERO, RN  Department: [facility]  Number of Participants: 1  Group Focus: check in and feeling awareness/expression Treatment Modality:  Individual Therapy Interventions utilized were support Purpose: express feelings and increase insight  Name: Jennifer Mayo Date of Birth: [birth year]  MR: [id number]    Level of Participation: when cued Quality of Participation: attentive and cooperative Interactions with others: gave feedback Mood/Affect: appropriate Triggers (if applicable): None identified at this time Cognition: coherent/clear Progress: Gaining insight Response: Patient voiced need for routine and requested providers speak to her prior to order changes. Writer attempted to explain how order process works, patient appeared uninterested. Patient voiced no further complaints at this time. 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

## 2024-02-22 DIAGNOSIS — F1020 Alcohol dependence, uncomplicated: Secondary | ICD-10-CM | POA: Diagnosis not present

## 2024-02-22 MED ORDER — HYDROXYZINE HCL 25 MG PO TABS
25.0000 mg | ORAL_TABLET | Freq: Four times a day (QID) | ORAL | Status: DC | PRN
  Administered 2024-02-22 – 2024-02-25 (×8): 25 mg via ORAL
  Filled 2024-02-22 (×8): qty 1

## 2024-02-22 MED ORDER — ONDANSETRON HCL 4 MG PO TABS
8.0000 mg | ORAL_TABLET | Freq: Three times a day (TID) | ORAL | Status: DC | PRN
Start: 2024-02-22 — End: 2024-02-25
  Administered 2024-02-22 – 2024-02-24 (×4): 8 mg via ORAL
  Filled 2024-02-22 (×4): qty 2

## 2024-02-22 NOTE — ED Provider Notes (Addendum)
 [facility] MD/PA/NP OP Progress Note  02/22/2024 9:36 AM Jennifer Mayo  MRN:  [id number]  Evaluation:  Jennifer Mayo 48 year old Caucasian female was seen and evaluated face-to-face by this provider.  She reports overall, her mood has improved.  Reports feeling nervous about discharging on tomorrow.  States her family is supportive  but they can't be around me 24 hours a day.  She denies alcohol cravings however does report withdrawal symptoms.  She denies suicidal or homicidal ideations.  Denies visual or hallucinations.  Reports of poor appetite however, attributes appetite declined to limited nutrition options provided on Facility Based Crisis unit [facility]).    Jennifer Mayo stated she continues to experience diarrhea, internal anxiety and shakiness.   Reported, that she is that is currently losing hair  at a  alarming rate.  Does report that she hasn't taken medications prescribed by her bariatric surgeon to include multivitamins and 13 grams of protein daily that was recommended, since she has been admitted. Jennifer Mayo was recently initiated on Naltrexone ( Depade) 50 mg daily which she reports taking and tolerating well. CIWA's 6.  Patient to continue Prozac  40mg , Melation 5 mg and Librium 25 mg taper.   Jennifer Mayo inquired about FMLA paperwork, as she reported that she was scheduled to return back to work on 02/22/2024. Stated that she would also like to be considered for intermittent FMLA in order to make follow-up therapy appointments. Discussed consideration for attending chemical dependency outpatient programming and/or intensive outpatient programming  Support,encouragement and reassurance was provided.Jennifer Mayo   HPI:  Per charted assessment:Jennifer Mayo is a 48 year old female with psychiatric history of adjustment disorder with mixed anxiety and depressed mood, alcohol use disorder, mild recurrent major depression, and panic disorder .  Visit Diagnosis:    ICD-10-CM   1. Alcohol use disorder, severe, dependence (HCC)  F10.20        Past Psychiatric History: per charted information:  MDD, GAD, PTSD and ADHD. Mayo psych hospitalizations or suicide attempts.  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 She lives with her husband and 2 adult sons.   Past Medical History:  Past Medical History:  Diagnosis Date   Adjustment disorder with mixed anxiety and depressed mood 09/19/2020   Adrenal insufficiency 10/23/2016   Anemia    Cardiac microvascular disease    CHF (congestive heart failure) (HCC)    Chicken pox    Depression    Endometrial cancer (HCC) 2015   Endometrial adenocarcinoma EIN   Migraine    Hematologic and ophthalmic.   Mild diastolic dysfunction    Narcolepsy    Obstructive sleep apnea    4 years ago retested and Mayo longer has sleep apenea   Pleural effusion, bilateral    3-4 years    Solitary pulmonary nodule on lung CT    follow up CT recommended per medical records   Thyroid disease    Vitamin B 12 deficiency     Past Surgical History:  Procedure Laterality Date   ABDOMINAL HYSTERECTOMY  2015   CHOLECYSTECTOMY     LAPAROSCOPIC ROUX-EN-Y GASTRIC BYPASS WITH HIATAL HERNIA REPAIR N/A 07/01/2016   Procedure: LAPAROSCOPIC ROUX-EN-Y GASTRIC BYPASS WITH HIATAL HERNIA REPAIR, UPPER ENDO;  Surgeon: Herlene Beverley Bureau, MD;  Location: [facility];  Service: General;  Laterality: N/A;   UPPER GI ENDOSCOPY  07/01/2016   Procedure: UPPER GI ENDOSCOPY;  Surgeon: Herlene Righter Kinsinger, MD;  Location: [facility];  Service: General;;    Family Psychiatric History:   Family History:  Family History  Problem Relation Age of Onset   Brain cancer Mother    Seizures Mother    Stroke Mother    Breast cancer Mother 11   Diabetes Father     Hypertension Father    Hyperlipidemia Father    Hypertension Brother    Hypertension Maternal Grandmother    Colon cancer Maternal Grandmother    Atrial fibrillation Maternal Grandmother    Diabetes Paternal Grandmother    Hypertension Paternal Grandfather    Heart disease Paternal Grandfather     Social History:  Social History   Socioeconomic History   Marital status: Married    Spouse name: Not on file   Number of children: 3   Years of education: Not on file   Highest education level: Master's degree (e.g., MA, MS, MEng, MEd, MSW, MBA)  Occupational History   Not on file  Tobacco Use   Smoking status: Never    Passive exposure: Never   Smokeless tobacco: Never  Vaping Use   Vaping status: Never Used  Substance and Sexual Activity   Alcohol use: Yes    Alcohol/week: 14.0 standard drinks of alcohol    Types: 14 Glasses of wine per week    Comment: 2 large glasses of wine every night   Drug use: Mayo   Sexual activity: Yes    Partners: Male  Other Topics Concern   Not on file  Social History Narrative   Pt is R handed   Lives in 3 story home with her husband and 2 (sometimes 3) children   Has 3 children   Masters Degree x2 Development Worker, Community, NP   Practicing Nurse Practitioner    Caffeine: 2 c of coffee a day     -smoke alarm in the home:Yes     - wears seatbelt: Yes     - Feels safe in their relationships: Yes      Social Drivers of Corporate Investment Banker Strain: Not on file  Food Insecurity: Mayo Food Insecurity (02/18/2024)   Hunger Vital Sign    Worried About Running Out of Food in the Last Year: Never true    Ran Out of Food in the Last Year: Never true  Transportation Needs: Mayo Transportation Needs (02/18/2024)   PRAPARE - Administrator, Civil Service (Medical): Mayo    Lack of Transportation (Non-Medical): Mayo  Physical Activity: Not on file  Stress: Not on file  Social Connections: Not on file    Allergies:  Allergies   Allergen Reactions   Ranexa [Ranolazine] Shortness Of Breath   Nsaids Other (See Comments)    Gastric bypass - ulcer risk   Verapamil Other (See Comments)    hypotension   Topamax [Topiramate] Hives and Rash    Metabolic Disorder Labs: Lab Results  Component Value Date   HGBA1C 5.1 09/26/2022   MPG 102.54 11/20/2017   Lab Results  Component Value Date   PROLACTIN 14.8 02/18/2024   Lab Results  Component Value Date   CHOL 188 09/26/2022   TRIG 85.0 09/26/2022   HDL 121.30 09/26/2022   CHOLHDL 2 09/26/2022   VLDL 17.0 09/26/2022   LDLCALC 49 09/26/2022   LDLCALC 72 11/20/2017   Lab  Results  Component Value Date   TSH 2.357 02/18/2024   TSH 5.16 09/26/2022    Therapeutic Level Labs: Mayo results found for: LITHIUM Mayo results found for: VALPROATE Mayo results found for: CBMZ  Current Medications: Current Facility-Administered Medications  Medication Dose Route Frequency Provider Last Rate Last Admin   acetaminophen  (TYLENOL ) tablet 650 mg  650 mg Oral Q6H PRN Onuoha, Chinwendu V, NP       alum & mag hydroxide-simeth (MAALOX/MYLANTA) 200-200-20 MG/5ML suspension 30 mL  30 mL Oral Q4H PRN Onuoha, Chinwendu V, NP       amLODipine  (NORVASC ) tablet 5 mg  5 mg Oral QHS Onuoha, Chinwendu V, NP   5 mg at 02/21/24 2119   chlordiazePOXIDE (LIBRIUM) capsule 25 mg  25 mg Oral Daily Onuoha, Chinwendu V, NP       haloperidol (HALDOL) tablet 5 mg  5 mg Oral TID PRN Onuoha, Chinwendu V, NP       And   diphenhydrAMINE  (BENADRYL ) capsule 50 mg  50 mg Oral TID PRN Onuoha, Chinwendu V, NP       haloperidol lactate (HALDOL) injection 5 mg  5 mg Intramuscular TID PRN Onuoha, Chinwendu V, NP       And   diphenhydrAMINE  (BENADRYL ) injection 50 mg  50 mg Intramuscular TID PRN Onuoha, Chinwendu V, NP       And   LORazepam (ATIVAN) injection 2 mg  2 mg Intramuscular TID PRN Onuoha, Chinwendu V, NP       haloperidol lactate (HALDOL) injection 10 mg  10 mg Intramuscular TID PRN Onuoha,  Chinwendu V, NP       And   diphenhydrAMINE  (BENADRYL ) injection 50 mg  50 mg Intramuscular TID PRN Onuoha, Chinwendu V, NP       And   LORazepam (ATIVAN) injection 2 mg  2 mg Intramuscular TID PRN Onuoha, Chinwendu V, NP       feeding supplement (GLUCERNA SHAKE) (GLUCERNA SHAKE) liquid 237 mL  237 mL Oral Q24H Lawrnce, Rhoda J, MD   237 mL at 02/22/24 9180   FLUoxetine  (PROZAC ) capsule 40 mg  40 mg Oral Daily Gottfried, Rhoda J, MD       loperamide (IMODIUM) capsule 2-4 mg  2-4 mg Oral PRN Gottfried, Rhoda J, MD   2 mg at 02/21/24 2119   magnesium hydroxide (MILK OF MAGNESIA) suspension 30 mL  30 mL Oral Daily PRN Onuoha, Chinwendu V, NP       melatonin tablet 5 mg  5 mg Oral QHS PRN Onuoha, Chinwendu V, NP       multivitamin with minerals tablet 1 tablet  1 tablet Oral Daily Onuoha, Chinwendu V, NP   1 tablet at 02/21/24 0903   naltrexone (DEPADE) tablet 50 mg  50 mg Oral Daily Gottfried, Rhoda J, MD   50 mg at 02/21/24 0903   Current Outpatient Medications  Medication Sig Dispense Refill   AIMOVIG  140 MG/ML SOAJ Inject 140 mg into the skin every 30 (thirty) days.     amLODipine  (NORVASC ) 2.5 MG tablet Take 1 tablet (2.5 mg total) by mouth 2 (two) times daily. (Patient taking differently: Take 5 mg by mouth at bedtime.) 180 tablet 1   FLUoxetine  (PROZAC ) 10 MG capsule Take 1 capsule (10 mg total) by mouth daily. (Patient taking differently: Take 20 mg by mouth at bedtime.) 30 capsule 0   Multiple Vitamins-Minerals (BARIATRIC MULTIVITAMINS/IRON  PO) Take 1 tablet by mouth daily.     Multiple Vitamins-Minerals (HAIR SKIN &  NAILS PO) Take 2 tablets by mouth daily.     ondansetron  (ZOFRAN -ODT) 4 MG disintegrating tablet Take 4-8 mg by mouth every 8 (eight) hours as needed for nausea or vomiting.     OVER THE COUNTER MEDICATION Take 1 tablet by mouth in the morning, at noon, and at bedtime. Calcium  with Vitamin D  Chews       Musculoskeletal: Strength & Muscle Tone: within normal limits Gait  & Station: unsteady ataxic gait Patient leans: N/A  Psychiatric Specialty Exam: Review of Systems  Psychiatric/Behavioral:  Positive for decreased concentration. Negative for suicidal ideas. The patient is nervous/anxious.     Blood pressure 125/88, pulse 76, temperature 98.3 F (36.8 C), temperature source Oral, resp. rate 18, SpO2 98%.There is Mayo height or weight on file to calculate BMI.  General Appearance: Casual  Eye Contact:  Good  Speech:  Clear and Coherent  Volume:  Normal  Mood:  Anxious  Affect:  Congruent  Thought Process:  Coherent  Orientation:  Full (Time, Place, and Person)  Thought Content: Logical   Suicidal Thoughts:  Mayo  Homicidal Thoughts:  Mayo  Memory:  Immediate;   Good Recent;   Good  Judgement:  Good  Insight:  Good  Psychomotor Activity:  Normal  Concentration:  Concentration: Good  Recall:  Good  Fund of Knowledge: Good  Language: Good  Akathisia:  Mayo  Handed:  Right  AIMS (if indicated): not done  Assets:  Communication Skills Desire for Improvement  ADL's:  Intact  Cognition: WNL  Sleep:  Fair   Screenings: GAD-7    Garment/textile Technologist Visit from 09/26/2022 in [facility] [place] [facility] Visit from 08/26/2022 in [facility] [address]  Total GAD-7 Score 8 8   PHQ2-9    Flowsheet Row ED from 02/19/2024 in [facility] ED from 02/18/2024 in [facility] Office Visit from 02/12/2024 in [facility] Office Visit from 09/26/2022 in [facility] [place] Office Visit from 08/26/2022 in [facility] [place]  PHQ-2 Total Score 5 5 2 4 1   PHQ-9 Total Score 18 18 16 15 14    Flowsheet Row ED from 02/18/2024 in [facility] Emergency Department at [facility] Most recent reading at 02/18/2024  1:35 PM ED from 02/18/2024 in [facility] Most recent reading at 02/18/2024 10:11 AM ED from 02/18/2024 in [facility] Most recent reading at 02/18/2024  7:35 AM  C-SSRS RISK CATEGORY Mayo Risk Mayo Risk Mayo Risk     Assessment and Plan: Daily contact with patient to assess and evaluate symptoms and progress in treatment and Medication management  Continue with current treatment plan on 02/22/2024 as listed below except where noted:   Gastric By-pass Vitamins related to decreased nutrient absorption and glucerna meal supplement in the morning   Hypertension: Continue amlodipine  5 mg daily   Alcohol dependence  CIWA protocol/Librium Continue Naltrexone 50 mg daily Discussed 30 AA meetings in 30 days  Pt has never gone to an AA meeting and does not have a sponsor.    Major depressive disorder: Posttraumatic stress disorder: Generalized anxiety disorder:  Continue Prozac  40 mg daily Provided psychoeducation   Social work arranging outpatient substance treatment and psychiatric care. Continue to discuss meeting opportunities. Pt is interested in [facility].   Anticipated discharge:  11/3 if withdrawal/detox  protocol is completed without further complications.   Staci LOISE Kerns, NP 02/22/2024, 9:36 AM

## 2024-02-22 NOTE — ED Notes (Signed)
 Pt A&Ox4, calm and cooperative, in NAD. Denies SI/HI/AVH.Compliant with meds this morning. C/O having headache 3/10 prn tylenol  650 mg po given as ordered. Will continue to monitor throughout the shift.

## 2024-02-22 NOTE — Group Note (Signed)
 Group Topic: Wellness  Group Date: 02/22/2024 Start Time: 2030 End Time: 2100 Facilitators: Anice Benton LABOR, NT  Department: [facility]  Number of Participants: 7  Group Focus: check in, clarity of thought, and communication Treatment Modality:  Individual Therapy Interventions utilized were support Purpose: express feelings and regain self-worth  Name: Regine Christian Farino Date of Birth: 08-Feb-1976  MR: [id number]    Level of Participation: Active Quality of Participation: attentive and cooperative Interactions with others: gave feedback Mood/Affect: appropriate, positive Triggers (if applicable): N/A Cognition: coherent/clear Progress: Moderate Response: Good Plan: follow-up needed  Patients Problems:  Patient Active Problem List   Diagnosis Date Noted   Alcohol use disorder, severe, dependence (HCC) 02/19/2024   Alcohol use disorder 02/18/2024   Murmur 09/27/2022   Family history of breast cancer in mother 09/26/2022   FH: stroke 09/26/2022   FH: heart disease 09/26/2022   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

## 2024-02-22 NOTE — Progress Notes (Signed)
 Patient is currently resting with eyes closed at this time.  No acute distress noted.  Respirations present and unlabored.  No current issues noted at this time.  Will continue to monitor Q 15 min safety checks for safety/behavior.

## 2024-02-22 NOTE — Progress Notes (Signed)
 Meal given

## 2024-02-22 NOTE — Group Note (Signed)
 Group Topic: Healthy Self Image and Positive Change  Group Date: 02/22/2024 Start Time: 1200 End Time: 1230 Facilitators: Elnor Keven SAILOR  Department: [facility]  Number of Participants: 9  Group Focus: affirmation Treatment Modality:  Psychoeducation Interventions utilized were support Purpose: reinforce self-care  Name: Jennifer Mayo Date of Birth: 1976-02-28  MR: [id number]    Level of Participation: active Quality of Participation: attentive Interactions with others: gave feedback Mood/Affect: appropriate Triggers (if applicable): NA Cognition: coherent/clear Progress: Moderate Response: Pt shared that she has a strong support system of healthy relationships from her church. Pt shares that she has a goal of making healthier choices.  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

## 2024-02-22 NOTE — ED Notes (Signed)
 Patient resting with eyes closed in no apparent acute distress. Respirations even and unlabored. Environment secured. Safety checks in place according to facility policy.

## 2024-02-22 NOTE — ED Notes (Signed)
 PRN atarax given due to patient reports of anxiety 7/10. Medication administered with no complications. Environment secured, safety checks in place per facility policy.

## 2024-02-22 NOTE — Progress Notes (Signed)
 Patient is currently resting with eyes closed at this time.  No acute distress noted.  Respirations present and unlabored.  No current issues noted at this time.  Will continue safety checks Q 15 min for safety/behavior.

## 2024-02-22 NOTE — ED Notes (Signed)
 Patient is Jennifer Mayo

## 2024-02-23 MED ORDER — GABAPENTIN 100 MG PO CAPS
200.0000 mg | ORAL_CAPSULE | Freq: Three times a day (TID) | ORAL | Status: DC
  Administered 2024-02-23 – 2024-02-25 (×7): 200 mg via ORAL
  Filled 2024-02-23 (×7): qty 2

## 2024-02-23 NOTE — ED Notes (Addendum)
 Jennifer Mayo's focus is on being discharged. She took all scheduled medications without issue. Pt complained about not having Librium and stated she still has anxiety, stomach nerves. CIWA update provider. CIWA is 1. Will update MD of pt concerns.  No inappropriate behavior noted. Denied si/hi/avh. She watched TV for short time and then to bed. R/F of chest noted as well as snoring during safety rounds. No sign of discomfort. Slept with ease throughout the night. Will continue to monitor and report changes as noted. Safety maintained.

## 2024-02-23 NOTE — ED Provider Notes (Signed)
 BH MD/PA/NP OP Progress Note  02/23/2024 5:42 PM Jennifer Mayo  MRN:  [id number]  HPI:  Jennifer Mayo is a 48 year old female who presents voluntarily to [facility] for walk-in assessment.  Patient seen previously at [facility] on 02/16/2024, formulated plan for alcohol use treatment and facility based crisis unit admission on that date. Patient was transferred to [facility] for medical clearance due to tachycardia & stabilization on same date prior to being transferred back to the [facility] on 10/29 continuity for detox from alcohol.    Patient assessment today: On assessment today, patient continues to complain of tremors to bilateral arms, is observed to have moderate tremors to bilateral upper extremities, complaining of diarrhea states that she has had 2 loose stools since time of assessment which was 10 AM earlier today morning.  Complained of abdominal cramping, observed to be holding onto walls to ambulate around the unit, gait grossly unstable.  Patient reports that this is partly due to a broken foot sustained approximately 3 months ago, states that she just stopped wearing a cam boot prior to presentation to this facility.  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  Positively reinforcements provided to patient to take care of her self just like she takes care of her patients.  Patient talked to clinical research associate about wanting to find out if she has short-term disability available at her job, after which she will make the decision to take time off to take care of herself.  Writer talked to patient about [facility] program for substance abuse, and asked her to inquire about this program from [facility].    Writer checked back with Jennifer Mayo a few hours later, and he stated that patient had been accepted into [facility], with a date of  11/6.  This is the revised discharge date for patient, as we are seeking to enhance a door-to-door transfer to [facility] so as to help her sustain her sobriety.  We started gabapentin  200 mg 3 times daily to help with tremors and GAD.  Patient also reported neuropathy to bilateral feet, and this will be helpful as well.  Patient denies SI/HI/AVH.  Education was provided on gabapentin ; rationales, benefits, possible side effects of medication provided, patient verbalized understanding, with agreeable to trials.  Labs reviewed:  WBCs previously 2.4, will repeat for tomorrow morning.  Also ordering TIBC, vitamin D , B12, repeat hepatitis panel, hepatic function panel.  As per previous assessments from yesterday: [facility] inquired about FMLA paperwork, as she reported that she was scheduled to return back to work on 02/22/2024. Stated that she would also like to be considered for intermittent FMLA in order to make follow-up therapy appointments. Discussed consideration for attending chemical dependency outpatient programming and/or intensive outpatient programming  Support,encouragement and reassurance was provided.[facility]  Writer talked to patient about the above, encouraged her to talk to [facility] when she gets there, and they will be able to complete her FMLA paperwork.  Visit Diagnosis:    ICD-10-CM   1. Alcohol use disorder, severe, dependence (HCC)  F10.20      Past Psychiatric History: per charted information:  MDD, GAD, PTSD and ADHD. No psych hospitalizations or suicide attempts.  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 She lives with her husband and 2 adult sons.   Past Medical History:  Past Medical History:   Diagnosis Date   Adjustment disorder with mixed anxiety and depressed mood 09/19/2020   Adrenal insufficiency 10/23/2016   Anemia    Cardiac microvascular disease    CHF (congestive heart failure) (HCC)    Chicken pox    Depression    Endometrial cancer (HCC) 2015   Endometrial adenocarcinoma EIN   Migraine    Hematologic and ophthalmic.   Mild diastolic dysfunction    Narcolepsy    Obstructive sleep apnea    4 years ago retested and no longer has sleep apenea   Pleural effusion, bilateral    3-4 years    Solitary pulmonary nodule on lung CT    follow up CT recommended per medical records   Thyroid disease    Vitamin B 12 deficiency     Past Surgical History:  Procedure Laterality Date   ABDOMINAL HYSTERECTOMY  2015   CHOLECYSTECTOMY     LAPAROSCOPIC ROUX-EN-Y GASTRIC BYPASS WITH HIATAL HERNIA REPAIR N/A 07/01/2016   Procedure: LAPAROSCOPIC ROUX-EN-Y GASTRIC BYPASS WITH HIATAL HERNIA REPAIR, UPPER ENDO;  Surgeon: Herlene Righter Kinsinger, MD;  Location: [facility];  Service: General;  Laterality: N/A;   UPPER GI ENDOSCOPY  07/01/2016   Procedure: UPPER GI ENDOSCOPY;  Surgeon: Herlene Righter Kinsinger, MD;  Location: [facility];  Service: General;;    Family Psychiatric History:   Family History:  Family History  Problem Relation Age of Onset   Brain cancer Mother    Seizures Mother    Stroke Mother    Breast cancer Mother 43   Diabetes Father    Hypertension Father    Hyperlipidemia Father    Hypertension Brother    Hypertension Maternal Grandmother    Colon cancer Maternal Grandmother    Atrial fibrillation Maternal Grandmother    Diabetes Paternal Grandmother    Hypertension Paternal Grandfather    Heart disease Paternal Grandfather    Social History:  Social History   Socioeconomic History   Marital status: Married    Spouse name: Not on file   Number of children: 3   Years of education: Not on file   Highest education level: Master's degree (e.g., MA, MS, MEng, MEd,  MSW, MBA)  Occupational History   Not on file  Tobacco Use   Smoking status: Never    Passive exposure: Never   Smokeless tobacco: Never  Vaping Use   Vaping status: Never Used  Substance and Sexual Activity   Alcohol use: Yes    Alcohol/week: 14.0 standard drinks of alcohol    Types: 14 Glasses of wine per week    Comment: 2 large glasses of wine every night   Drug use: No   Sexual activity: Yes    Partners: Male  Other Topics Concern   Not on file  Social History Narrative   Pt is R handed   Lives in 3 story home with her husband and 2 (sometimes 3) children   Has 3 children   Masters Degree x2 Development Worker, Community, NP   Practicing Nurse Practitioner    Caffeine: 2 c of coffee a day     -smoke alarm in the home:Yes     - wears seatbelt: Yes     -  Feels safe in their relationships: Yes      Social Drivers of Corporate Investment Banker Strain: Not on file  Food Insecurity: No Food Insecurity (02/18/2024)   Hunger Vital Sign    Worried About Running Out of Food in the Last Year: Never true    Ran Out of Food in the Last Year: Never true  Transportation Needs: No Transportation Needs (02/18/2024)   PRAPARE - Administrator, Civil Service (Medical): No    Lack of Transportation (Non-Medical): No  Physical Activity: Not on file  Stress: Not on file  Social Connections: Not on file    Allergies:  Allergies  Allergen Reactions   Ranexa [Ranolazine] Shortness Of Breath   Nsaids Other (See Comments)    Gastric bypass - ulcer risk   Verapamil Other (See Comments)    hypotension   Topamax [Topiramate] Hives and Rash    Metabolic Disorder Labs: Lab Results  Component Value Date   HGBA1C 5.1 09/26/2022   MPG 102.54 11/20/2017   Lab Results  Component Value Date   PROLACTIN 14.8 02/18/2024   Lab Results  Component Value Date   CHOL 188 09/26/2022   TRIG 85.0 09/26/2022   HDL 121.30 09/26/2022   CHOLHDL 2 09/26/2022   VLDL 17.0 09/26/2022    LDLCALC 49 09/26/2022   LDLCALC 72 11/20/2017   Lab Results  Component Value Date   TSH 2.357 02/18/2024   TSH 5.16 09/26/2022    Therapeutic Level Labs: No results found for: LITHIUM No results found for: VALPROATE No results found for: CBMZ  Current Medications: Current Facility-Administered Medications  Medication Dose Route Frequency Provider Last Rate Last Admin   acetaminophen  (TYLENOL ) tablet 650 mg  650 mg Oral Q6H PRN Onuoha, Chinwendu V, NP   650 mg at 02/23/24 0914   alum & mag hydroxide-simeth (MAALOX/MYLANTA) 200-200-20 MG/5ML suspension 30 mL  30 mL Oral Q4H PRN Onuoha, Chinwendu V, NP       amLODipine  (NORVASC ) tablet 5 mg  5 mg Oral QHS Onuoha, Chinwendu V, NP   5 mg at 02/22/24 2115   haloperidol (HALDOL) tablet 5 mg  5 mg Oral TID PRN Onuoha, Chinwendu V, NP   5 mg at 02/22/24 2118   And   diphenhydrAMINE  (BENADRYL ) capsule 50 mg  50 mg Oral TID PRN Onuoha, Chinwendu V, NP       haloperidol lactate (HALDOL) injection 5 mg  5 mg Intramuscular TID PRN Onuoha, Chinwendu V, NP       And   diphenhydrAMINE  (BENADRYL ) injection 50 mg  50 mg Intramuscular TID PRN Onuoha, Chinwendu V, NP       And   LORazepam (ATIVAN) injection 2 mg  2 mg Intramuscular TID PRN Onuoha, Chinwendu V, NP       haloperidol lactate (HALDOL) injection 10 mg  10 mg Intramuscular TID PRN Onuoha, Chinwendu V, NP       And   diphenhydrAMINE  (BENADRYL ) injection 50 mg  50 mg Intramuscular TID PRN Onuoha, Chinwendu V, NP       And   LORazepam (ATIVAN) injection 2 mg  2 mg Intramuscular TID PRN Onuoha, Chinwendu V, NP       feeding supplement (GLUCERNA SHAKE) (GLUCERNA SHAKE) liquid 237 mL  237 mL Oral Q24H Gottfried, Rhoda J, MD   237 mL at 02/23/24 0914   FLUoxetine  (PROZAC ) capsule 40 mg  40 mg Oral Daily Gottfried, Rhoda J, MD   40 mg at 02/23/24 [phone]  gabapentin  (NEURONTIN ) capsule 200 mg  200 mg Oral TID Tex Drilling, NP   200 mg at 02/23/24 1738   hydrOXYzine (ATARAX) tablet 25 mg   25 mg Oral Q6H PRN Gottfried, Rhoda J, MD   25 mg at 02/23/24 0914   loperamide (IMODIUM) capsule 2-4 mg  2-4 mg Oral PRN Gottfried, Rhoda J, MD   2 mg at 02/22/24 2116   magnesium hydroxide (MILK OF MAGNESIA) suspension 30 mL  30 mL Oral Daily PRN Onuoha, Chinwendu V, NP       melatonin tablet 5 mg  5 mg Oral QHS PRN Onuoha, Chinwendu V, NP   5 mg at 02/22/24 2118   multivitamin with minerals tablet 1 tablet  1 tablet Oral Daily Onuoha, Chinwendu V, NP   1 tablet at 02/23/24 0914   naltrexone (DEPADE) tablet 50 mg  50 mg Oral Daily Gottfried, Rhoda J, MD   50 mg at 02/23/24 0914   ondansetron  (ZOFRAN ) tablet 8 mg  8 mg Oral Q8H PRN Gottfried, Rhoda J, MD   8 mg at 02/23/24 1739   Current Outpatient Medications  Medication Sig Dispense Refill   AIMOVIG  140 MG/ML SOAJ Inject 140 mg into the skin every 30 (thirty) days.     amLODipine  (NORVASC ) 2.5 MG tablet Take 1 tablet (2.5 mg total) by mouth 2 (two) times daily. (Patient taking differently: Take 5 mg by mouth at bedtime.) 180 tablet 1   FLUoxetine  (PROZAC ) 10 MG capsule Take 1 capsule (10 mg total) by mouth daily. (Patient taking differently: Take 20 mg by mouth at bedtime.) 30 capsule 0   Multiple Vitamins-Minerals (BARIATRIC MULTIVITAMINS/IRON  PO) Take 1 tablet by mouth daily.     Multiple Vitamins-Minerals (HAIR SKIN & NAILS PO) Take 2 tablets by mouth daily.     ondansetron  (ZOFRAN -ODT) 4 MG disintegrating tablet Take 4-8 mg by mouth every 8 (eight) hours as needed for nausea or vomiting.     OVER THE COUNTER MEDICATION Take 1 tablet by mouth in the morning, at noon, and at bedtime. Calcium  with Vitamin D  Chews       Musculoskeletal: Strength & Muscle Tone: within normal limits Gait & Station: unsteady ataxic gait Patient leans: N/A  Psychiatric Specialty Exam: Review of Systems  Psychiatric/Behavioral:  Positive for decreased concentration. Negative for suicidal ideas. The patient is nervous/anxious.     Blood pressure 110/69,  pulse 87, temperature 98.8 F (37.1 C), temperature source Oral, resp. rate 18, SpO2 95%.There is no height or weight on file to calculate BMI.  General Appearance: Casual  Eye Contact:  Good  Speech:  Clear and Coherent  Volume:  Normal  Mood:  Anxious  Affect:  Congruent  Thought Process:  Coherent  Orientation:  Full (Time, Place, and Person)  Thought Content: Logical   Suicidal Thoughts:  No  Homicidal Thoughts:  No  Memory:  Immediate;   Good Recent;   Good  Judgement:  Good  Insight:  Good  Psychomotor Activity:  Normal  Concentration:  Concentration: Good  Recall:  Good  Fund of Knowledge: Good  Language: Good  Akathisia:  No  Handed:  Right  AIMS (if indicated): not done  Assets:  Communication Skills Desire for Improvement  ADL's:  Intact  Cognition: WNL  Sleep:  Fair   Screenings: GAD-7    Garment/textile Technologist Visit from 09/26/2022 in [facility] Visit from 08/26/2022 in [facility]  Total GAD-7 Score  8 8   PHQ2-9    Flowsheet Row ED from 02/19/2024 in [facility] from 02/18/2024 in [facility] Visit from 02/12/2024 in [facility] Visit from 09/26/2022 in [facility] [place] [facility] Visit from 08/26/2022 in [facility] [place]  PHQ-2 Total Score 5 5 2 4 1   PHQ-9 Total Score 18 18 16 15 14    Flowsheet Row ED from 02/18/2024 in [facility] Most recent reading at 02/18/2024  1:35 PM ED from 02/18/2024 in [facility] Most recent reading at 02/18/2024 10:11 AM ED from 02/18/2024 in [facility] Most recent reading at 02/18/2024  7:35 AM  C-SSRS RISK CATEGORY No Risk No Risk No Risk   Assessment and Plan: Daily contact with  patient to assess and evaluate symptoms and progress in treatment and Medication management  Continue with current treatment plan on 02/23/2024 as listed below except where noted:   Gastric By-pass Vitamins related to decreased nutrient absorption and glucerna meal supplement in the morning   Hypertension: Continue amlodipine  5 mg daily   Alcohol dependence  CIWA protocol/Librium Continue Naltrexone 50 mg daily Discussed 30 AA meetings in 30 days  Pt has never gone to an AA meeting and does not have a sponsor.    Major depressive disorder: Posttraumatic stress disorder: Generalized anxiety disorder: Start Gabapentin  200 mg TID for GAD/ETOH use d/o/neuropathy Continue Prozac  40 mg daily Provided psychoeducation   Social work arranging outpatient substance treatment and psychiatric care. Continue to discuss meeting opportunities. Pt is interested in [facility].   Anticipated discharge:  11/6 if withdrawal/detox protocol is completed without further complications.  Donia Snell, NP 02/23/2024, 5:42 PM

## 2024-02-23 NOTE — ED Notes (Signed)
 Patient is in the dayroom calm and composed, watching TV with other patients, and eating snacks. NAD. Respirations even and unlabored. Environment secured per policy. Will monitor for safety

## 2024-02-23 NOTE — Group Note (Signed)
 Group Topic: Communication  Group Date: 02/23/2024 Start Time: 1200 End Time: 1230 Facilitators: Reinhold Minor BROCKS, RN  Department: [facility]  Number of Participants: 9  Group Focus: communication Treatment Modality:  Psychoeducation Interventions utilized were clarification, patient education, and support Purpose: increase insight and reinforce self-care  Name: Jennifer Mayo Date of Birth: [birth year]  MR: [id number]    Level of Participation: minimal Quality of Participation: cooperative Interactions with others: gave feedback Mood/Affect: appropriate Triggers (if applicable): na Cognition: insightful Progress: Moderate Response: feedback understanding of when to communicate needs Plan: patient will be encouraged to actively engage with staff  Patients Problems:  Patient Active Problem List   Diagnosis Date Noted   Alcohol use disorder, severe, dependence (HCC) 02/19/2024   Alcohol use disorder 02/18/2024   Murmur 09/27/2022   Family history of breast cancer in mother 09/26/2022   FH: stroke 09/26/2022   FH: heart disease 09/26/2022   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

## 2024-02-23 NOTE — Group Note (Signed)
 Group Topic: Recovery Basics  Group Date: 02/23/2024 Start Time: 1000 End Time: 1100 Facilitators: Elnor Keven SAILOR  Department: [facility]  Number of Participants: 8  Group Focus: coping skills Treatment Modality:  Psychoeducation Interventions utilized were support Purpose: enhance coping skills  Name: [facility] Reber Date of Birth: [birth year]  MR: [id number]    Level of Participation: moderate Quality of Participation: engaged Interactions with others: gave feedback Mood/Affect: appropriate Triggers (if applicable): NA Cognition: coherent/clear Progress: Moderate Response: Pt shares she has a strong support system at home. Pt expressed her goals to use her support system to be consistent in her recovery.  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

## 2024-02-23 NOTE — Progress Notes (Signed)
 Meal given

## 2024-02-23 NOTE — Group Note (Signed)
 Group Topic: Communication  Group Date: 02/23/2024 Start Time: 2030 End Time: 2100 Facilitators: Anice Benton LABOR, NT  Department: [facility]  Number of Participants: 6  Group Focus: art therapy, communication Treatment Modality:  Art Therapy Interventions utilized were group exercise Purpose: pt got to guess drawings   Name: Abygale Karpf Castrillon Date of Birth: 24-Mar-1976  MR: [id number]    Level of Participation: active Quality of Participation: attentive Interactions with others: gave feedback Mood/Affect: appropriate Triggers (if applicable): N/A Cognition: insightful Progress: Moderate Response: Good Plan: follow-up needed  Patients Problems:  Patient Active Problem List   Diagnosis Date Noted   Alcohol use disorder, severe, dependence (HCC) 02/19/2024   Alcohol use disorder 02/18/2024   Murmur 09/27/2022   Family history of breast cancer in mother 09/26/2022   FH: stroke 09/26/2022   FH: heart disease 09/26/2022   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

## 2024-02-23 NOTE — ED Notes (Signed)
 Patient is asleep. Respirations even and unlabored. No acute distress observed.  Resting in bed asleep. Environment secured. Poc ongoing

## 2024-02-23 NOTE — Care Management (Signed)
 [facility] Care Management...  Writer met with patient...  Patient was scheduled for discharge today per provider patient will not discharge today.  Patient considering inpatient treatment due triggers at home  Writer sent referral to [facility]

## 2024-02-24 DIAGNOSIS — F330 Major depressive disorder, recurrent, mild: Secondary | ICD-10-CM | POA: Diagnosis not present

## 2024-02-24 DIAGNOSIS — F10230 Alcohol dependence with withdrawal, uncomplicated: Secondary | ICD-10-CM | POA: Diagnosis present

## 2024-02-24 DIAGNOSIS — S92909D Unspecified fracture of unspecified foot, subsequent encounter for fracture with routine healing: Secondary | ICD-10-CM | POA: Diagnosis not present

## 2024-02-24 DIAGNOSIS — R2681 Unsteadiness on feet: Secondary | ICD-10-CM | POA: Diagnosis not present

## 2024-02-24 DIAGNOSIS — F4323 Adjustment disorder with mixed anxiety and depressed mood: Secondary | ICD-10-CM | POA: Diagnosis not present

## 2024-02-24 DIAGNOSIS — G629 Polyneuropathy, unspecified: Secondary | ICD-10-CM | POA: Diagnosis not present

## 2024-02-24 DIAGNOSIS — X58XXXD Exposure to other specified factors, subsequent encounter: Secondary | ICD-10-CM | POA: Diagnosis not present

## 2024-02-24 DIAGNOSIS — F410 Panic disorder [episodic paroxysmal anxiety] without agoraphobia: Secondary | ICD-10-CM | POA: Diagnosis not present

## 2024-02-24 DIAGNOSIS — G4710 Hypersomnia, unspecified: Secondary | ICD-10-CM | POA: Diagnosis not present

## 2024-02-24 LAB — LIPID PANEL
Cholesterol: 171 mg/dL (ref 0–200)
HDL: 96 mg/dL (ref >40)
LDL Cholesterol: 65 mg/dL (ref 0–99)
Total CHOL/HDL Ratio: 1.8 ratio
Triglycerides: 48 mg/dL (ref <150)
VLDL: 10 mg/dL (ref 0–40)

## 2024-02-24 LAB — CBC WITH DIFFERENTIAL/PLATELET
Abs Immature Granulocytes: 0.01 K/uL (ref 0.00–0.07)
Basophils Absolute: 0 K/uL (ref 0.0–0.1)
Basophils Relative: 1 %
Eosinophils Absolute: 0 K/uL (ref 0.0–0.5)
Eosinophils Relative: 1 %
HCT: 39.8 % (ref 36.0–46.0)
Hemoglobin: 13.3 g/dL (ref 12.0–15.0)
Immature Granulocytes: 0 %
Lymphocytes Relative: 33 %
Lymphs Abs: 1.3 K/uL (ref 0.7–4.0)
MCH: 31.3 pg (ref 26.0–34.0)
MCHC: 33.4 g/dL (ref 30.0–36.0)
MCV: 93.6 fL (ref 80.0–100.0)
Monocytes Absolute: 0.4 K/uL (ref 0.1–1.0)
Monocytes Relative: 11 %
Neutro Abs: 2.2 K/uL (ref 1.7–7.7)
Neutrophils Relative %: 54 %
Platelets: 124 K/uL — ABNORMAL LOW (ref 150–400)
RBC: 4.25 MIL/uL (ref 3.87–5.11)
RDW: 13.1 % (ref 11.5–15.5)
WBC: 4 K/uL (ref 4.0–10.5)
nRBC: 0 % (ref 0.0–0.2)

## 2024-02-24 LAB — IRON AND TIBC
Iron: 37 ug/dL (ref 28–170)
Saturation Ratios: 8 % — ABNORMAL LOW (ref 10.4–31.8)
TIBC: 447 ug/dL (ref 250–450)
UIBC: 410 ug/dL

## 2024-02-24 LAB — HEMOGLOBIN A1C
Hgb A1c MFr Bld: 4.8 % (ref 4.8–5.6)
Mean Plasma Glucose: 91.06 mg/dL

## 2024-02-24 LAB — VITAMIN D 25 HYDROXY (VIT D DEFICIENCY, FRACTURES): Vit D, 25-Hydroxy: 20.25 ng/mL — ABNORMAL LOW (ref 30–100)

## 2024-02-24 LAB — VITAMIN B12: Vitamin B-12: 218 pg/mL (ref 180–914)

## 2024-02-24 MED ORDER — FLUOXETINE HCL 20 MG PO CAPS: 40.0000 mg | ORAL_CAPSULE | Freq: Every day | ORAL | Status: DC

## 2024-02-24 MED ORDER — OYSTER SHELL CALCIUM/D3 500-5 MG-MCG PO TABS
1.0000 | ORAL_TABLET | Freq: Every day | ORAL | Status: DC
  Administered 2024-02-25: 1 via ORAL
  Filled 2024-02-24: qty 1

## 2024-02-24 NOTE — ED Notes (Signed)
 Patient asleep, CIWA assessment not done at this time. Will keep monitoring for safety.

## 2024-02-24 NOTE — ED Provider Notes (Cosign Needed Addendum)
 BH MD/PA/NP OP Progress Note  02/24/2024 6:16 PM Jennifer Mayo  MRN:  [id number]  HPI:  Jennifer Mayo is a 48 year old female who presents voluntarily to [facility] behavioral [facility] for walk-in assessment.  Patient seen previously at [facility] on 02/16/2024, formulated plan for alcohol use treatment and facility based crisis unit admission on that date. Patient was transferred to [facility] for medical clearance due to tachycardia & stabilization on same date prior to being transferred back to the [facility] on 10/29 continuity for detox from alcohol.    Patient assessment today: On assessment today, the pt reports that their mood is euthymic, improved since admission, and stable. Denies feeling down, depressed, or sad.  Reports that anxiety symptoms are at manageable level.  Sleep is stable. Appetite is stable.  Concentration is without complaint.  Energy level is adequate. Denies having any suicidal thoughts. Denies having any suicidal intent and plan.  Denies having any HI.  Denies having psychotic symptoms.  Denies having side effects to current psychiatric medications.  Discussed discharge planning for tomorrow 02/25/2024.  Patient is agreeable to discharging tomorrow, with an intake date for [facility] scheduled for 02/26/2024.  Patient was able to call [facility] today, enabled by CSW and complete her phone interview.  Labs reviewed: WBCs and now within normal limits at 4.0.  Platelets are 124, but this seems to be a chronic low and as per chart review, platelets have been running low for a year now.  Patient has been educated to follow-up with her primary care provider after discharge.  We talked about chronic fatigue, patient prefers for Prozac  to be switched to nighttime, and other medications continued as ordered.  She also shares that she typically takes a calcium , vitamin D  chewable combo at home, and is wanting for this to be ordered. Med entered for patient as Vitamin D  is also low at 20.    Visit Diagnosis:    ICD-10-CM   1. Alcohol use disorder, severe, dependence (HCC)  F10.20      Past Psychiatric History: per charted information:  MDD, GAD, PTSD and ADHD. No psych hospitalizations or suicide attempts.  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 She lives with her husband and 2 adult sons.   Past Medical History:  Past Medical History:  Diagnosis Date   Adjustment disorder with mixed anxiety and depressed mood 09/19/2020   Adrenal insufficiency 10/23/2016   Anemia    Cardiac microvascular disease    CHF (congestive heart failure) (HCC)    Chicken pox    Depression    Endometrial cancer (HCC) 2015   Endometrial adenocarcinoma EIN   Migraine    Hematologic and ophthalmic.   Mild diastolic dysfunction    Narcolepsy    Obstructive sleep apnea    4 years ago retested and no longer has sleep apenea   Pleural effusion, bilateral    3-4 years    Solitary pulmonary nodule on lung CT    follow up CT recommended per medical records   Thyroid disease    Vitamin B 12 deficiency     Past Surgical History:  Procedure Laterality Date   ABDOMINAL HYSTERECTOMY  2015   CHOLECYSTECTOMY     LAPAROSCOPIC ROUX-EN-Y GASTRIC BYPASS WITH HIATAL  HERNIA REPAIR N/A 07/01/2016   Procedure: LAPAROSCOPIC ROUX-EN-Y GASTRIC BYPASS WITH HIATAL HERNIA REPAIR, UPPER ENDO;  Surgeon: Herlene Righter Kinsinger, MD;  Location: [facility];  Service: General;  Laterality: N/A;   UPPER GI ENDOSCOPY  07/01/2016   Procedure: UPPER GI ENDOSCOPY;  Surgeon: Herlene Righter Kinsinger, MD;  Location: [facility];  Service: General;;    Family Psychiatric History:   Family History:  Family History  Problem Relation Age of Onset   Brain cancer  Mother    Seizures Mother    Stroke Mother    Breast cancer Mother 43   Diabetes Father    Hypertension Father    Hyperlipidemia Father    Hypertension Brother    Hypertension Maternal Grandmother    Colon cancer Maternal Grandmother    Atrial fibrillation Maternal Grandmother    Diabetes Paternal Grandmother    Hypertension Paternal Grandfather    Heart disease Paternal Grandfather    Social History:  Social History   Socioeconomic History   Marital status: Married    Spouse name: Not on file   Number of children: 3   Years of education: Not on file   Highest education level: Master's degree (e.g., MA, MS, MEng, MEd, MSW, MBA)  Occupational History   Not on file  Tobacco Use   Smoking status: Never    Passive exposure: Never   Smokeless tobacco: Never  Vaping Use   Vaping status: Never Used  Substance and Sexual Activity   Alcohol use: Yes    Alcohol/week: 14.0 standard drinks of alcohol    Types: 14 Glasses of wine per week    Comment: 2 large glasses of wine every night   Drug use: No   Sexual activity: Yes    Partners: Male  Other Topics Concern   Not on file  Social History Narrative   Pt is R handed   Lives in 3 story home with her husband and 2 (sometimes 3) children   Has 3 children   Masters Degree x2 Development Worker, Community, NP   Practicing Nurse Practitioner    Caffeine: 2 c of coffee a day     -smoke alarm in the home:Yes     - wears seatbelt: Yes     - Feels safe in their relationships: Yes      Social Drivers of Corporate Investment Banker Strain: Not on file  Food Insecurity: No Food Insecurity (02/18/2024)   Hunger Vital Sign    Worried About Running Out of Food in the Last Year: Never true    Ran Out of Food in the Last Year: Never true  Transportation Needs: No Transportation Needs (02/18/2024)   PRAPARE - Administrator, Civil Service (Medical): No    Lack of Transportation (Non-Medical): No  Physical Activity: Not on  file  Stress: Not on file  Social Connections: Not on file    Allergies:  Allergies  Allergen Reactions   Ranexa [Ranolazine] Shortness Of Breath   Nsaids Other (See Comments)    Gastric bypass - ulcer risk   Verapamil Other (See Comments)    hypotension   Topamax [Topiramate] Hives and Rash    Metabolic Disorder Labs: Lab Results  Component Value Date   HGBA1C 4.8 02/24/2024   MPG 91.06 02/24/2024   MPG 102.54 11/20/2017   Lab Results  Component Value Date   PROLACTIN 14.8 02/18/2024   Lab Results  Component Value Date   CHOL 171 02/24/2024   TRIG  48 02/24/2024   HDL 96 02/24/2024   CHOLHDL 1.8 02/24/2024   VLDL 10 02/24/2024   LDLCALC 65 02/24/2024   LDLCALC 49 09/26/2022   Lab Results  Component Value Date   TSH 2.357 02/18/2024   TSH 5.16 09/26/2022    Therapeutic Level Labs: No results found for: LITHIUM No results found for: VALPROATE No results found for: CBMZ  Current Medications: Current Facility-Administered Medications  Medication Dose Route Frequency Provider Last Rate Last Admin   acetaminophen  (TYLENOL ) tablet 650 mg  650 mg Oral Q6H PRN Onuoha, Chinwendu V, NP   650 mg at 02/23/24 0914   alum & mag hydroxide-simeth (MAALOX/MYLANTA) 200-200-20 MG/5ML suspension 30 mL  30 mL Oral Q4H PRN Onuoha, Chinwendu V, NP       amLODipine  (NORVASC ) tablet 5 mg  5 mg Oral QHS Onuoha, Chinwendu V, NP   5 mg at 02/23/24 2110   [START ON 02/25/2024] calcium -vitamin D  (OSCAL WITH D) 500-5 MG-MCG per tablet 1 tablet  1 tablet Oral Q breakfast Delroy Ordway, NP       haloperidol (HALDOL) tablet 5 mg  5 mg Oral TID PRN Onuoha, Chinwendu V, NP   5 mg at 02/22/24 2118   And   diphenhydrAMINE  (BENADRYL ) capsule 50 mg  50 mg Oral TID PRN Onuoha, Chinwendu V, NP       haloperidol lactate (HALDOL) injection 5 mg  5 mg Intramuscular TID PRN Onuoha, Chinwendu V, NP       And   diphenhydrAMINE  (BENADRYL ) injection 50 mg  50 mg Intramuscular TID PRN Onuoha, Chinwendu  V, NP       And   LORazepam (ATIVAN) injection 2 mg  2 mg Intramuscular TID PRN Onuoha, Chinwendu V, NP       haloperidol lactate (HALDOL) injection 10 mg  10 mg Intramuscular TID PRN Onuoha, Chinwendu V, NP       And   diphenhydrAMINE  (BENADRYL ) injection 50 mg  50 mg Intramuscular TID PRN Onuoha, Chinwendu V, NP       And   LORazepam (ATIVAN) injection 2 mg  2 mg Intramuscular TID PRN Onuoha, Chinwendu V, NP       feeding supplement (GLUCERNA SHAKE) (GLUCERNA SHAKE) liquid 237 mL  237 mL Oral Q24H Gottfried, Rhoda J, MD   237 mL at 02/24/24 0804   [START ON 02/25/2024] FLUoxetine  (PROZAC ) capsule 40 mg  40 mg Oral QHS Jeanne Diefendorf, NP       gabapentin  (NEURONTIN ) capsule 200 mg  200 mg Oral TID Cassian Torelli, NP   200 mg at 02/24/24 1510   hydrOXYzine (ATARAX) tablet 25 mg  25 mg Oral Q6H PRN Gottfried, Rhoda J, MD   25 mg at 02/24/24 1511   loperamide (IMODIUM) capsule 2-4 mg  2-4 mg Oral PRN Gottfried, Rhoda J, MD   2 mg at 02/22/24 2116   magnesium hydroxide (MILK OF MAGNESIA) suspension 30 mL  30 mL Oral Daily PRN Onuoha, Chinwendu V, NP       melatonin tablet 5 mg  5 mg Oral QHS PRN Onuoha, Chinwendu V, NP   5 mg at 02/22/24 2118   multivitamin with minerals tablet 1 tablet  1 tablet Oral Daily Onuoha, Chinwendu V, NP   1 tablet at 02/24/24 0913   naltrexone (DEPADE) tablet 50 mg  50 mg Oral Daily Gottfried, Rhoda J, MD   50 mg at 02/24/24 0912   ondansetron  (ZOFRAN ) tablet 8 mg  8 mg Oral Q8H PRN Lawrnce Coup  J, MD   8 mg at 02/24/24 9081   Current Outpatient Medications  Medication Sig Dispense Refill   AIMOVIG  140 MG/ML SOAJ Inject 140 mg into the skin every 30 (thirty) days.     amLODipine  (NORVASC ) 2.5 MG tablet Take 1 tablet (2.5 mg total) by mouth 2 (two) times daily. (Patient taking differently: Take 5 mg by mouth at bedtime.) 180 tablet 1   FLUoxetine  (PROZAC ) 10 MG capsule Take 1 capsule (10 mg total) by mouth daily. (Patient taking differently: Take 20 mg by mouth at  bedtime.) 30 capsule 0   Multiple Vitamins-Minerals (BARIATRIC MULTIVITAMINS/IRON  PO) Take 1 tablet by mouth daily.     Multiple Vitamins-Minerals (HAIR SKIN & NAILS PO) Take 2 tablets by mouth daily.     ondansetron  (ZOFRAN -ODT) 4 MG disintegrating tablet Take 4-8 mg by mouth every 8 (eight) hours as needed for nausea or vomiting.     OVER THE COUNTER MEDICATION Take 1 tablet by mouth in the morning, at noon, and at bedtime. Calcium  with Vitamin D  Chews       Musculoskeletal: Strength & Muscle Tone: within normal limits Gait & Station: unsteady ataxic gait Patient leans: N/A  Psychiatric Specialty Exam: Review of Systems  Psychiatric/Behavioral:  Positive for decreased concentration. Negative for suicidal ideas. The patient is nervous/anxious.     Blood pressure 111/81, pulse 81, temperature 97.7 F (36.5 C), temperature source Oral, resp. rate 18, SpO2 98%.There is no height or weight on file to calculate BMI.  General Appearance: Casual  Eye Contact:  Good  Speech:  Clear and Coherent  Volume:  Normal  Mood:  Anxious  Affect:  Congruent  Thought Process:  Coherent  Orientation:  Full (Time, Place, and Person)  Thought Content: Logical   Suicidal Thoughts:  No  Homicidal Thoughts:  No  Memory:  Immediate;   Good Recent;   Good  Judgement:  Good  Insight:  Good  Psychomotor Activity:  Normal  Concentration:  Concentration: Good  Recall:  Good  Fund of Knowledge: Good  Language: Good  Akathisia:  No  Handed:  Right  AIMS (if indicated): not done  Assets:  Communication Skills Desire for Improvement  ADL's:  Intact  Cognition: WNL  Sleep:  Fair   Screenings: GAD-7    Garment/textile Technologist Visit from 09/26/2022 in [facility] [place] Office Visit from 08/26/2022 in [facility] [place]  Total GAD-7 Score 8 8   PHQ2-9    Flowsheet Row ED from 02/19/2024 in [facility] ED from 02/18/2024 in  [facility] Office Visit from 02/12/2024 in [facility] Office Visit from 09/26/2022 in [facility] [place] Office Visit from 08/26/2022 in [facility] [place]  PHQ-2 Total Score 2 5 2 4 1   PHQ-9 Total Score 6 18 16 15 14    Flowsheet Row ED from 02/18/2024 in [facility] Most recent reading at 02/18/2024  1:35 PM ED from 02/18/2024 in [facility] Most recent reading at 02/18/2024 10:11 AM ED from 02/18/2024 in [facility] Most recent reading at 02/18/2024  7:35 AM  C-SSRS RISK CATEGORY No Risk No Risk No Risk   Assessment and Plan: Daily contact with patient to assess and evaluate symptoms and progress in treatment and Medication management  Continue with current treatment  plan on 02/23/2024 as listed below except where noted:   Gastric By-pass Vitamins related to decreased nutrient absorption and glucerna meal supplement in the morning   Hypertension: Continue amlodipine  5 mg daily   Alcohol dependence  CIWA protocol/Librium Continue Naltrexone 50 mg daily Discussed 30 AA meetings in 30 days  Pt has never gone to an [employer] and does not have a sponsor.    Major depressive disorder: Posttraumatic stress disorder: Generalized anxiety disorder: -Continue Gabapentin  200 mg TID for GAD/ETOH use d/o/neuropathy -Start calcium , vitamin D  chewable combo daily for supplementation -Continue Prozac  40 mg daily and switch to nightly for MDD/GAD (time changed due to complaints of daytime grogginess) Patient educated on rationales, benefits, and possible side effects of all medications.  Verbalized understanding.  Social work arranging outpatient substance treatment and psychiatric care. Continue to discuss meeting opportunities. Pt is interested in [facility].   Anticipated discharge:  11/6 if withdrawal/detox protocol is completed without further complications.  Donia Snell, NP 02/24/2024, 6:16 PM

## 2024-02-24 NOTE — ED Notes (Signed)
 Paitent provided breakfast.

## 2024-02-24 NOTE — ED Notes (Signed)
 Pt sitting in dayroom watching television and interacting with peers. No acute distress noted. Patient given PRN for anxiety.  Informed pt to notify staff with any needs or assistance. Pt verbalized understanding and agreement. Will continue to monitor for safety.

## 2024-02-24 NOTE — ED Notes (Signed)
 Patient c/o nausea and anxiety. PRN medications given Zofran  and Vistaril

## 2024-02-24 NOTE — ED Notes (Signed)
 Pt resting in bed, no acute distress noted. Respirations even and unlabored. Continue to monitor for safety.

## 2024-02-24 NOTE — ED Notes (Signed)
 Pt in the dayroom watching TV and interacting with peers. Respirations even and unlabored. Q15 min safety checks continued.

## 2024-02-24 NOTE — ED Notes (Addendum)
 Pt alert and oriented x4 in no acute distress. RR even and unlabored. Environment secured. Will continue to monitor for safety. Patient denies SI/HI and denies AH/VH. She endorses anxiety and depression.

## 2024-02-24 NOTE — Group Note (Signed)
 Group Topic: Wellness  Group Date: 02/24/2024 Start Time: 1200 End Time: 1230 Facilitators: Daved Tinnie HERO, RN; Leron, Charolette CROME, RN; Herold Lajuana NOVAK, RN  Department: [facility]  Number of Participants: 11  Group Focus: nursing group Treatment Modality:  Psychoeducation Interventions utilized were patient education Purpose: relapse prevention strategies  Name: Jennifer Mayo Date of Birth: 04/26/1975  MR: [id number]    Level of Participation: moderate Quality of Participation: cooperative Interactions with others: gave feedback Mood/Affect: appropriate Triggers (if applicable): n/a Cognition: coherent/clear Progress: Gaining insight Response: RN reviewed medications with pt, pt expressed understanding, further questions denied  Plan: patient will be encouraged to attend future RN educations groups, discuss medication questions with RN as needed.   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

## 2024-02-24 NOTE — ED Notes (Signed)
 Patient is in the bedroom calm and sleeping. NAD. Q15 checks in place, Will monitor for safety.

## 2024-02-24 NOTE — Group Note (Signed)
 Group Topic: Social Support  Group Date: 02/24/2024 Start Time: 1945 End Time: 2015 Facilitators: Joan Plowman B  Department: [facility]  Number of Participants: 12  Group Focus: check in Treatment Modality:  Individual Therapy Interventions utilized were leisure development Purpose: express feelings  Name: Jennifer Mayo Date of Birth: [birth year]  MR: [id number]    Level of Participation: active Quality of Participation: attentive, cooperative, and initiates communication Interactions with others: gave feedback Mood/Affect: appropriate Triggers (if applicable): NA Cognition: coherent/clear Progress: Gaining insight Response: NA Plan: patient will be encouraged to keep going to groups  Patients Problems:  Patient Active Problem List   Diagnosis Date Noted   Alcohol use disorder, severe, dependence (HCC) 02/19/2024   Alcohol use disorder 02/18/2024   Murmur 09/27/2022   Family history of breast cancer in mother 09/26/2022   FH: stroke 09/26/2022   FH: heart disease 09/26/2022   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

## 2024-02-24 NOTE — ED Notes (Signed)
 Patient is in the bedroom calm and composed, sleeping. NAD. Q15 checks in place, Will monitor for safety.

## 2024-02-24 NOTE — Group Note (Signed)
 Group Topic: Change and Accountability  Group Date: 02/24/2024 Start Time: 1325 End Time: 1357 Facilitators: Veverly Oddis BRAVO, NT  Department: [facility]  Number of Participants: 7  Group Focus: Personal Strengths Treatment Modality:  Cognitive Behavioral Therapy Interventions utilized were assignment Purpose: regain self-worth  Name: Jennifer Mayo Date of Birth: [birth year]  MR: [id number]    Level of Participation: active Quality of Participation: engaged Interactions with others: gave feedback Mood/Affect: bright Triggers (if applicable): none Cognition: coherent/clear Progress: Moderate Response:  I have great perseverance. Plan: patient will be encouraged to be open minded of other strengths as well and encouraged to continue coming to group.  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

## 2024-02-24 NOTE — ED Notes (Signed)
 Paitent provided dinner.

## 2024-02-24 NOTE — Care Management (Addendum)
 [facility] Care Management...  Writer discussed discharging planning.   Patient has out patient service appointment for 11/6/205 @ 9:30 am   Patient has psychiatrist appointment 03/04/2024 @ 11:30 am for medication Management.  Patient was referred to [facility] 02/23/2024  Patient completing phone intake with [facility] today.  Patient is scheduled for inpatient treatment @ [facility]  Per [place] @ [facility], patient is scheduled for 02/26/2024 @ 8:30 am  Patient will discharge to home 02/25/2024  Patient will arrange transportation  7 day supply and scripts

## 2024-02-25 DIAGNOSIS — F1020 Alcohol dependence, uncomplicated: Secondary | ICD-10-CM | POA: Diagnosis not present

## 2024-02-25 MED ORDER — FLUOXETINE HCL 40 MG PO CAPS: 40.0000 mg | ORAL_CAPSULE | Freq: Every day | ORAL | 0 refills | Status: DC

## 2024-02-25 MED ORDER — OYSTER SHELL CALCIUM/D3 500-5 MG-MCG PO TABS: 1.0000 | ORAL_TABLET | Freq: Every day | ORAL | 0 refills | Status: DC

## 2024-02-25 MED ORDER — ADULT MULTIVITAMIN W/MINERALS CH: 1.0000 | ORAL_TABLET | Freq: Every day | ORAL | Status: AC

## 2024-02-25 MED ORDER — VITAMIN D (ERGOCALCIFEROL) 1.25 MG (50000 UNIT) PO CAPS: 50000.0000 [IU] | ORAL_CAPSULE | ORAL | 0 refills | Status: DC

## 2024-02-25 MED ORDER — HYDROXYZINE HCL 25 MG PO TABS: 25.0000 mg | ORAL_TABLET | Freq: Four times a day (QID) | ORAL | 0 refills | Status: DC | PRN

## 2024-02-25 MED ORDER — MELATONIN 5 MG PO TABS: 5.0000 mg | ORAL_TABLET | Freq: Every evening | ORAL | Status: DC | PRN

## 2024-02-25 MED ORDER — VITAMIN D (ERGOCALCIFEROL) 1.25 MG (50000 UNIT) PO CAPS
50000.0000 [IU] | ORAL_CAPSULE | ORAL | Status: DC
  Administered 2024-02-25: 50000 [IU] via ORAL
  Filled 2024-02-25: qty 1

## 2024-02-25 MED ORDER — GABAPENTIN 100 MG PO CAPS: 200.0000 mg | ORAL_CAPSULE | Freq: Three times a day (TID) | ORAL | 0 refills | Status: DC

## 2024-02-25 MED ORDER — AMLODIPINE BESYLATE 5 MG PO TABS: 5.0000 mg | ORAL_TABLET | Freq: Every day | ORAL | 0 refills | Status: DC

## 2024-02-25 NOTE — ED Notes (Signed)
 Pt is sleeping at the moment. No acute distress noted. Respirations are even and labored. Q 15 safety checks in place per facility policy.

## 2024-02-25 NOTE — ED Notes (Signed)
 Patient discharged home per provider order. After Visit Summary (AVS) printed and given to patient. AVS reviewed with patient and all questions fully answered. Patient discharged in no acute distress, A& O x4 and ambulatory. Patient denied SI/HI, A/VH upon discharge. Patient verbalized understanding of all discharge instructions explained by staff, including follow up appointments, RX's and safety plan. Patient mood fair. Patient belongings returned to patient from locker complete and intact. Patient escorted to lobby via staff for transport to destination. Safety maintained.

## 2024-02-25 NOTE — ED Notes (Signed)
 Patient sitting in dayroom participating in nursing group, calm and composed. No acute distress noted. No concerns voiced. No inappropriate behaviors observed or reported at this time. Informed patient to notify staff with any needs or assistance. Patient verbalized understanding or agreement. Safety checks in place per facility policy.

## 2024-02-25 NOTE — ED Provider Notes (Signed)
 FBC/OBS ASAP Discharge Summary  Date and Time: 02/25/2024 6:11 PM  Name: Jennifer Mayo  MRN:  [id number]   Discharge Diagnoses:  Final diagnoses:  Alcohol use disorder, severe, dependence (HCC)   HPI:  Jennifer Mayo is a 48 year old female who presents voluntarily to [facility] behavioral health for walk-in assessment.  Patient seen previously at [facility] on 02/16/2024, formulated plan for alcohol use treatment and facility based crisis unit admission on that date. Patient was transferred to [facility] for medical clearance due to tachycardia & stabilization on same date prior to being transferred back to the [facility] on 10/29 continuity for detox from alcohol.    Stay Summary: Patient was recommended for inpatient hospitalization & was admitted to the Facility Based Crises center here at the [facility] for treatment and stabilization of her mental status. Pt's home medication was restarted ( after being educated that medication is a relatively safer option in terms of it's metabolic profile when compared to the other antipsychotic medications that she has been on for mood stabilization. Pt has tolerated this medication thus far well, but is resistant about upward titrations, and is adamant about leaving the dose at 5 mg daily at time of discharge, and this is the dose that we will be discharging her on.  During the patient's hospitalization, patient had extensive initial psychiatric evaluation, and follow-up psychiatric evaluations every day. The patient denies having side effects to prescribed psychiatric medication. The patient was evaluated each day by a clinical provider to ascertain response to treatment. Improvement was noted by the patient's report of decreasing symptoms, improved sleep and appetite, affect, medication tolerance, behavior, and participation in unit programming.   The patient reports their target psychiatric symptoms of withdrawals, insomnia, anxiety, depression responded well to the psychiatric  medications, and the patient reports overall benefit other psychiatric stay. Supportive psychotherapy was provided to the patient. The patient also participated in regular group therapy while hospitalized. Coping skills, problem solving as well as relaxation therapies were also part of the unit programming.  Labs were reviewed with the patient, and abnormal results were discussed with the patient. Vitamin D  is low and has been replenished with D 50.000 units weekly. Pt educated to f/u with her PCP regarding this and other medical conditions.WBCs low on admissioin and now within normal limits at 4.0. Platelets are 124, but this seems to be a chronic low and as per chart review, platelets have been running low for a year now, and pt also educated to follow this upo with her PCP.   The patient is able to verbalize their individual safety plan to this provider. Patient has been accepted into [facility] with a presentation date for tomorrow, 02/26/2024.  # It is recommended to the patient to continue psychiatric medications as prescribed, after discharge from the hospital.    # It is recommended to the patient to follow up with your outpatient psychiatric provider and PCP.  # It was discussed with the patient, the impact of alcohol, drugs, tobacco have been there overall psychiatric and medical wellbeing, and total abstinence from substance use was recommended the patient.ed.  # Prescriptions provided or sent directly to preferred pharmacy at discharge. Patient agreeable to plan. Given opportunity to ask questions. Appears to feel comfortable with discharge.    # In the event of worsening symptoms, the patient is instructed to call the crisis hotline, 911 and or go to the nearest ED for appropriate evaluation and treatment of symptoms. To follow-up with primary  care provider for other medical issues, concerns and or health care needs.  # Patient was discharged homewith a plan to follow up as  noted below.    Total Time spent with patient: 45 minutes  Tobacco Cessation:  N/A, patient does not currently use tobacco products  Current Medications:  Current Facility-Administered Medications  Medication Dose Route Frequency Provider Last Rate Last Admin   acetaminophen  (TYLENOL ) tablet 650 mg  650 mg Oral Q6H PRN Onuoha, Chinwendu V, NP   650 mg at 02/24/24 2112   alum & mag hydroxide-simeth (MAALOX/MYLANTA) 200-200-20 MG/5ML suspension 30 mL  30 mL Oral Q4H PRN Onuoha, Chinwendu V, NP       amLODipine  (NORVASC ) tablet 5 mg  5 mg Oral QHS Onuoha, Chinwendu V, NP   5 mg at 02/24/24 2111   calcium -vitamin D  (OSCAL WITH D) 500-5 MG-MCG per tablet 1 tablet  1 tablet Oral Q breakfast Tex Drilling, NP   1 tablet at 02/25/24 0807   haloperidol (HALDOL) tablet 5 mg  5 mg Oral TID PRN Onuoha, Chinwendu V, NP   5 mg at 02/22/24 2118   And   diphenhydrAMINE  (BENADRYL ) capsule 50 mg  50 mg Oral TID PRN Onuoha, Chinwendu V, NP       haloperidol lactate (HALDOL) injection 5 mg  5 mg Intramuscular TID PRN Onuoha, Chinwendu V, NP       And   diphenhydrAMINE  (BENADRYL ) injection 50 mg  50 mg Intramuscular TID PRN Onuoha, Chinwendu V, NP       And   LORazepam (ATIVAN) injection 2 mg  2 mg Intramuscular TID PRN Onuoha, Chinwendu V, NP       haloperidol lactate (HALDOL) injection 10 mg  10 mg Intramuscular TID PRN Onuoha, Chinwendu V, NP       And   diphenhydrAMINE  (BENADRYL ) injection 50 mg  50 mg Intramuscular TID PRN Onuoha, Chinwendu V, NP       And   LORazepam (ATIVAN) injection 2 mg  2 mg Intramuscular TID PRN Onuoha, Chinwendu V, NP       feeding supplement (GLUCERNA SHAKE) (GLUCERNA SHAKE) liquid 237 mL  237 mL Oral Q24H Lawrnce, Rhoda J, MD   237 mL at 02/25/24 9192   FLUoxetine  (PROZAC ) capsule 40 mg  40 mg Oral QHS Chasey Dull, NP       gabapentin  (NEURONTIN ) capsule 200 mg  200 mg Oral TID Murat Rideout, NP   200 mg at 02/25/24 1654   hydrOXYzine (ATARAX) tablet 25 mg  25 mg Oral  Q6H PRN Gottfried, Rhoda J, MD   25 mg at 02/25/24 0924   loperamide (IMODIUM) capsule 2-4 mg  2-4 mg Oral PRN Gottfried, Rhoda J, MD   2 mg at 02/22/24 2116   magnesium hydroxide (MILK OF MAGNESIA) suspension 30 mL  30 mL Oral Daily PRN Onuoha, Chinwendu V, NP   30 mL at 02/24/24 2328   melatonin tablet 5 mg  5 mg Oral QHS PRN Onuoha, Chinwendu V, NP   5 mg at 02/22/24 2118   multivitamin with minerals tablet 1 tablet  1 tablet Oral Daily Onuoha, Chinwendu V, NP   1 tablet at 02/25/24 0924   naltrexone (DEPADE) tablet 50 mg  50 mg Oral Daily Gottfried, Rhoda J, MD   50 mg at 02/25/24 9075   ondansetron  (ZOFRAN ) tablet 8 mg  8 mg Oral Q8H PRN Gottfried, Rhoda J, MD   8 mg at 02/24/24 2012   Vitamin D  (Ergocalciferol ) (DRISDOL) 1.25  MG (50000 UNIT) capsule 50,000 Units  50,000 Units Oral Q7 days Tex Drilling, NP   50,000 Units at 02/25/24 1654   Current Outpatient Medications  Medication Sig Dispense Refill   amLODipine  (NORVASC ) 5 MG tablet Take 1 tablet (5 mg total) by mouth at bedtime. 30 tablet 0   [START ON 02/26/2024] calcium -vitamin D  (OSCAL WITH D) 500-5 MG-MCG tablet Take 1 tablet by mouth daily with breakfast. 30 tablet 0   FLUoxetine  (PROZAC ) 40 MG capsule Take 1 capsule (40 mg total) by mouth at bedtime. 30 capsule 0   gabapentin  (NEURONTIN ) 100 MG capsule Take 2 capsules (200 mg total) by mouth 3 (three) times daily. 180 capsule 0   hydrOXYzine (ATARAX) 25 MG tablet Take 1 tablet (25 mg total) by mouth every 6 (six) hours as needed for anxiety. 30 tablet 0   melatonin 5 MG TABS Take 1 tablet (5 mg total) by mouth at bedtime as needed.     Multiple Vitamin (MULTIVITAMIN WITH MINERALS) TABS tablet Take 1 tablet by mouth daily.     Vitamin D , Ergocalciferol , (DRISDOL) 1.25 MG (50000 UNIT) CAPS capsule Take 1 capsule (50,000 Units total) by mouth every 7 (seven) days. 5 capsule 0    PTA Medications:  Facility Ordered Medications  Medication   [COMPLETED] thiamine (VITAMIN B1)  injection 100 mg   [COMPLETED] LORazepam (ATIVAN) tablet 2 mg   Or   [COMPLETED] LORazepam (ATIVAN) injection 2 mg   [COMPLETED] lactated ringers  bolus 2,000 mL   [COMPLETED] LORazepam (ATIVAN) injection 1 mg   [COMPLETED] metoprolol tartrate (LOPRESSOR) injection 2.5 mg   acetaminophen  (TYLENOL ) tablet 650 mg   alum & mag hydroxide-simeth (MAALOX/MYLANTA) 200-200-20 MG/5ML suspension 30 mL   magnesium hydroxide (MILK OF MAGNESIA) suspension 30 mL   multivitamin with minerals tablet 1 tablet   [EXPIRED] chlordiazePOXIDE (LIBRIUM) capsule 25 mg   [EXPIRED] hydrOXYzine (ATARAX) tablet 25 mg   [EXPIRED] ondansetron  (ZOFRAN -ODT) disintegrating tablet 4 mg   haloperidol (HALDOL) tablet 5 mg   And   diphenhydrAMINE  (BENADRYL ) capsule 50 mg   haloperidol lactate (HALDOL) injection 5 mg   And   diphenhydrAMINE  (BENADRYL ) injection 50 mg   And   LORazepam (ATIVAN) injection 2 mg   haloperidol lactate (HALDOL) injection 10 mg   And   diphenhydrAMINE  (BENADRYL ) injection 50 mg   And   LORazepam (ATIVAN) injection 2 mg   [COMPLETED] chlordiazePOXIDE (LIBRIUM) capsule 25 mg   Followed by   [COMPLETED] chlordiazePOXIDE (LIBRIUM) capsule 25 mg   Followed by   [COMPLETED] chlordiazePOXIDE (LIBRIUM) capsule 25 mg   Followed by   [COMPLETED] chlordiazePOXIDE (LIBRIUM) capsule 25 mg   amLODipine  (NORVASC ) tablet 5 mg   melatonin tablet 5 mg   [COMPLETED] loperamide (IMODIUM) capsule 2 mg   loperamide (IMODIUM) capsule 2-4 mg   naltrexone (DEPADE) tablet 50 mg   feeding supplement (GLUCERNA SHAKE) (GLUCERNA SHAKE) liquid 237 mL   [COMPLETED] FLUoxetine  (PROZAC ) capsule 20 mg   hydrOXYzine (ATARAX) tablet 25 mg   ondansetron  (ZOFRAN ) tablet 8 mg   gabapentin  (NEURONTIN ) capsule 200 mg   calcium -vitamin D  (OSCAL WITH D) 500-5 MG-MCG per tablet 1 tablet   FLUoxetine  (PROZAC ) capsule 40 mg   Vitamin D  (Ergocalciferol ) (DRISDOL) 1.25 MG (50000 UNIT) capsule 50,000 Units   PTA Medications   Medication Sig   amLODipine  (NORVASC ) 5 MG tablet Take 1 tablet (5 mg total) by mouth at bedtime.   [START ON 02/26/2024] calcium -vitamin D  (OSCAL WITH D) 500-5 MG-MCG tablet Take 1  tablet by mouth daily with breakfast.   FLUoxetine  (PROZAC ) 40 MG capsule Take 1 capsule (40 mg total) by mouth at bedtime.   gabapentin  (NEURONTIN ) 100 MG capsule Take 2 capsules (200 mg total) by mouth 3 (three) times daily.   hydrOXYzine (ATARAX) 25 MG tablet Take 1 tablet (25 mg total) by mouth every 6 (six) hours as needed for anxiety.   melatonin 5 MG TABS Take 1 tablet (5 mg total) by mouth at bedtime as needed.   Multiple Vitamin (MULTIVITAMIN WITH MINERALS) TABS tablet Take 1 tablet by mouth daily.   Vitamin D , Ergocalciferol , (DRISDOL) 1.25 MG (50000 UNIT) CAPS capsule Take 1 capsule (50,000 Units total) by mouth every 7 (seven) days.       02/25/2024    8:51 AM 02/23/2024    5:42 PM 02/19/2024   12:50 AM  Depression screen PHQ 2/9  Decreased Interest 0 1 2  Down, Depressed, Hopeless 0 1 3  PHQ - 2 Score 0 2 5  Altered sleeping 0 1 3  Tired, decreased energy 0 1 3  Change in appetite 0 1 2  Feeling bad or failure about yourself  0 1 3  Trouble concentrating 0 0 2  Moving slowly or fidgety/restless 0 0 0  Suicidal thoughts 0 0 0  PHQ-9 Score 0 6 18  Difficult doing work/chores Not difficult at all Somewhat difficult Very difficult    Flowsheet Row ED from 02/18/2024 in [facility] Most recent reading at 02/18/2024  1:35 PM ED from 02/18/2024 in [facility] Most recent reading at 02/18/2024 10:11 AM ED from 02/18/2024 in [facility] Most recent reading at 02/18/2024  7:35 AM  C-SSRS RISK CATEGORY No Risk No Risk No Risk    Musculoskeletal  Strength & Muscle Tone: within normal limits Gait & Station: normal Patient leans: N/A  Psychiatric Specialty Exam  Presentation  General Appearance:   Fairly Groomed  Eye Contact: Fair  Speech: Clear and Coherent  Speech Volume: Normal  Handedness: Right   Mood and Affect  Mood: Anxious; Depressed  Affect: Congruent   Thought Process  Thought Processes: Coherent  Descriptions of Associations:Intact  Orientation:Full (Time, Place and Person)  Thought Content:Logical  Diagnosis of Schizophrenia or Schizoaffective disorder in past: No    Hallucinations:Hallucinations: None  Ideas of Reference:None  Suicidal Thoughts:Suicidal Thoughts: No  Homicidal Thoughts:Homicidal Thoughts: No   Sensorium  Memory: Immediate Fair  Judgment: Fair  Insight: Fair   Art Therapist  Concentration: Fair  Attention Span: Good  Recall: Good  Fund of Knowledge: Good  Language: Good   Psychomotor Activity  Psychomotor Activity: Psychomotor Activity: Normal AIMS Completed?: No   Assets  Assets: Resilience   Sleep  Sleep: Sleep: Good  Estimated Sleeping Duration (Last 24 Hours): 6.50-7.75 hours (Due to Daylight Saving Time, the durations displayed may not accurately represent documentation during the time change interval)  Nutritional Assessment (For OBS and FBC admissions only) Has the patient had a weight loss or gain of 10 pounds or more in the last 3 months?: No Has the patient had a decrease in food intake/or appetite?: No Does the patient have dental problems?: No Does the patient have eating habits or behaviors that may be indicators of an eating disorder including binging or inducing vomiting?: No Has the patient recently lost weight without trying?: 0 Has the patient been eating poorly because of a decreased appetite?: 0 Malnutrition Screening Tool Score:  0    Physical Exam  Physical Exam Vitals and nursing note reviewed.  HENT:     Nose: Nose normal.  Pulmonary:     Effort: Pulmonary effort is normal.  Musculoskeletal:     Cervical back: Normal range of motion.   Neurological:     General: No focal deficit present.     Mental Status: She is alert and oriented to person, place, and time.  Psychiatric:        Mood and Affect: Mood normal.        Behavior: Behavior normal.        Thought Content: Thought content normal.        Judgment: Judgment normal.    Review of Systems  Psychiatric/Behavioral:  Positive for depression (stable) and substance abuse (stable). Negative for hallucinations, memory loss and suicidal ideas. The patient is nervous/anxious (stable). The patient does not have insomnia.   All other systems reviewed and are negative.  Blood pressure 122/77, pulse 79, temperature 98.2 F (36.8 C), temperature source Oral, resp. rate 17, SpO2 98%. There is no height or weight on file to calculate BMI.  Demographic Factors:  Caucasian  Loss Factors: NA  Historical Factors: Family history of mental illness or substance abuse  Risk Reduction Factors:   Positive social support  Continued Clinical Symptoms:  Alcohol/Substance Abuse/Dependencies  Cognitive Features That Contribute To Risk:  None    Suicide Risk:  Mild: Denies Suicidal ideation. There are no identifiable plans, no associated intent, mild dysphoria and related symptoms, good self-control (both objective and subjective assessment), few other risk factors, and identifiable protective factors, including available and accessible social support.  Plan Of Care/Follow-up recommendations:  Discharged home with a plan to go to [facility] tomorrow, 02/26/2024 for a 30-day rehabilitation for alcohol abuse.  Donia Snell, NP 02/25/2024, 6:11 PM

## 2024-02-25 NOTE — ED Notes (Signed)
 Patient sitting in dayroom interacting with peers. No acute distress noted. No concerns voiced. No inappropriate behaviors observed or reported at this time. Informed patient to notify staff with any needs or assistance. Patient verbalized understanding or agreement. Safety checks in place per facility policy.

## 2024-02-25 NOTE — Group Note (Signed)
 Group Topic: Emotional Regulation  Group Date: 02/25/2024 Start Time: 1545 End Time: 1630 Facilitators: Ward, Zane HERO, RN; Linnie Thurmond HERO, RN  Department: [facility]  Number of Participants: 6  Group Focus: nursing group, coping skills, and medication education Treatment Modality:  Interpersonal Therapy and Psychoeducation Interventions utilized were clarification, group exercise, and patient education Purpose: enhance coping skills and increase insight  Name: Jennifer Mayo Date of Birth: 1975-06-21  MR: [id number]    Level of Participation: active Quality of Participation: attentive and cooperative Interactions with others: gave feedback Mood/Affect: appropriate and positive Triggers (if applicable): None identified at this time Cognition: coherent/clear, focused, and logical Progress: Gaining insight Response: Pt verbalized appreciateion, took notes and stated, I feel better Plan: patient will be encouraged to continue to attend future groups/programming on the unit  Patients Problems:  Patient Active Problem List   Diagnosis Date Noted   Alcohol use disorder, severe, dependence (HCC) 02/19/2024   Alcohol use disorder 02/18/2024   Murmur 09/27/2022   Family history of breast cancer in mother 09/26/2022   FH: stroke 09/26/2022   FH: heart disease 09/26/2022   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

## 2024-02-25 NOTE — ED Notes (Signed)
 PRN Atarax given due to patient reports of anxiety rating 6/10. Medication administered with no complications. Environment secured, safety checks in place per facility policy.

## 2024-02-25 NOTE — ED Notes (Signed)
 Patient resting with eyes closed in no apparent acute distress. Respirations even and unlabored. Environment secured. Safety checks in place according to facility policy.

## 2024-02-25 NOTE — ED Notes (Addendum)
 Patient alert & oriented x4. Denies intent to harm self or others when asked. Denies A/VH. Patient reports residual pain from a really back reflux attack last night, denies need for pain medication at this time. No acute distress noted. Support and encouragement provided. Patient observed in milieu. No inappropriate behaviors observed or reported. Routine safety checks conducted per facility protocol. Encouraged patient to notify staff if any thoughts of harm towards self or others arise. Patient verbalizes understanding and agreement.

## 2024-02-25 NOTE — Group Note (Signed)
 Group Topic: Social Support  Group Date: 02/25/2024 Start Time: 1000 End Time: 1100 Facilitators: Elnor Keven SAILOR  Department: [facility]  Number of Participants: 9  Group Focus: suicide prevention skills Treatment Modality:  Psychoeducation Interventions utilized were patient education Purpose: enhance coping skills  Name: Jennifer Mayo Date of Birth: [birth year]  MR: [id number]    Level of Participation: moderate Quality of Participation: attentive Interactions with others: gave feedback Mood/Affect: appropriate Triggers (if applicable): NA Cognition: coherent/clear Progress: Moderate Response: Pt shared history of depression and alcoholism. Pt listened to discussion of important of verbiage around mental health and education of loved ones.  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

## 2024-02-26 ENCOUNTER — Ambulatory Visit (HOSPITAL_COMMUNITY): Admitting: Licensed Clinical Social Worker

## 2024-03-04 ENCOUNTER — Encounter (HOSPITAL_COMMUNITY): Payer: Self-pay

## 2024-03-04 ENCOUNTER — Telehealth (HOSPITAL_COMMUNITY): Payer: Self-pay | Admitting: Psychiatry

## 2024-03-10 ENCOUNTER — Telehealth (HOSPITAL_COMMUNITY): Payer: Self-pay

## 2024-03-10 NOTE — Telephone Encounter (Signed)
 [facility] in [place] is requesting a 90 day prescription for pt's Fluoextine HCL 10 MG Capsule, which was just sent to pharmacy on 02/25/24. Please advise.

## 2024-03-17 NOTE — Telephone Encounter (Signed)
 Was not able to get in contact with her vm was left

## 2024-04-09 ENCOUNTER — Encounter: Payer: Self-pay | Admitting: Family Medicine

## 2024-04-09 ENCOUNTER — Ambulatory Visit (INDEPENDENT_AMBULATORY_CARE_PROVIDER_SITE_OTHER): Admitting: Family Medicine

## 2024-04-09 VITALS — BP 132/80 | HR 52 | Temp 97.6°F | Ht 65.0 in | Wt 175.6 lb

## 2024-04-09 DIAGNOSIS — I10 Essential (primary) hypertension: Secondary | ICD-10-CM

## 2024-04-09 DIAGNOSIS — E519 Thiamine deficiency, unspecified: Secondary | ICD-10-CM | POA: Diagnosis not present

## 2024-04-09 DIAGNOSIS — Z23 Encounter for immunization: Secondary | ICD-10-CM

## 2024-04-09 DIAGNOSIS — F10982 Alcohol use, unspecified with alcohol-induced sleep disorder: Secondary | ICD-10-CM | POA: Insufficient documentation

## 2024-04-09 DIAGNOSIS — F331 Major depressive disorder, recurrent, moderate: Secondary | ICD-10-CM | POA: Diagnosis not present

## 2024-04-09 DIAGNOSIS — D696 Thrombocytopenia, unspecified: Secondary | ICD-10-CM

## 2024-04-09 DIAGNOSIS — E611 Iron deficiency: Secondary | ICD-10-CM

## 2024-04-09 DIAGNOSIS — E663 Overweight: Secondary | ICD-10-CM | POA: Diagnosis not present

## 2024-04-09 DIAGNOSIS — F1020 Alcohol dependence, uncomplicated: Secondary | ICD-10-CM

## 2024-04-09 DIAGNOSIS — Z1231 Encounter for screening mammogram for malignant neoplasm of breast: Secondary | ICD-10-CM | POA: Diagnosis not present

## 2024-04-09 DIAGNOSIS — E538 Deficiency of other specified B group vitamins: Secondary | ICD-10-CM

## 2024-04-09 DIAGNOSIS — E559 Vitamin D deficiency, unspecified: Secondary | ICD-10-CM

## 2024-04-09 LAB — CBC
HCT: 40.5 % (ref 36.0–46.0)
Hemoglobin: 13.2 g/dL (ref 12.0–15.0)
MCHC: 32.6 g/dL (ref 30.0–36.0)
MCV: 91.8 fl (ref 78.0–100.0)
Platelets: 226 K/uL (ref 150.0–400.0)
RBC: 4.41 Mil/uL (ref 3.87–5.11)
RDW: 13.2 % (ref 11.5–15.5)
WBC: 5.1 K/uL (ref 4.0–10.5)

## 2024-04-09 MED ORDER — BUSPIRONE HCL 5 MG PO TABS: 5.0000 mg | ORAL_TABLET | Freq: Three times a day (TID) | ORAL | 0 refills | Status: AC

## 2024-04-09 MED ORDER — PROPRANOLOL HCL 10 MG PO TABS: 10.0000 mg | ORAL_TABLET | Freq: Two times a day (BID) | ORAL | 1 refills | Status: AC

## 2024-04-09 MED ORDER — QUETIAPINE FUMARATE 50 MG PO TABS: 50.0000 mg | ORAL_TABLET | Freq: Every day | ORAL | 0 refills | Status: DC

## 2024-04-09 MED ORDER — BUPROPION HCL ER (XL) 150 MG PO TB24: 300.0000 mg | ORAL_TABLET | Freq: Every morning | ORAL | 0 refills | Status: AC

## 2024-04-09 MED ORDER — NALTREXONE HCL 50 MG PO TABS: 50.0000 mg | ORAL_TABLET | Freq: Every morning | ORAL | 1 refills | Status: AC

## 2024-04-09 MED ORDER — GABAPENTIN 100 MG PO CAPS: 200.0000 mg | ORAL_CAPSULE | Freq: Three times a day (TID) | ORAL | 0 refills | Status: AC

## 2024-04-09 MED ORDER — HYDROCHLOROTHIAZIDE 12.5 MG PO CAPS: 12.5000 mg | ORAL_CAPSULE | Freq: Every morning | ORAL | 1 refills | Status: AC

## 2024-04-09 MED ORDER — PANTOPRAZOLE SODIUM 20 MG PO TBEC: 20.0000 mg | DELAYED_RELEASE_TABLET | Freq: Every morning | ORAL | 1 refills | Status: AC

## 2024-04-09 MED ORDER — LOSARTAN POTASSIUM 50 MG PO TABS: 50.0000 mg | ORAL_TABLET | Freq: Two times a day (BID) | ORAL | 1 refills | Status: AC

## 2024-04-09 MED ORDER — FLUOXETINE HCL 40 MG PO CAPS: 40.0000 mg | ORAL_CAPSULE | Freq: Every day | ORAL | 0 refills | Status: AC

## 2024-04-09 NOTE — Progress Notes (Unsigned)
 "    Jennifer Mayo , 1976/02/24, 48 y.o., female MRN: [id number] Patient Care Team    Relationship Specialty Notifications Start End  Catherine Charlies DELENA, DO PCP - General Family Medicine  08/26/22   Skeet Juliene SAUNDERS, DO Consulting Physician Neurology  06/26/18   Ladona Heinz, MD Consulting Physician Cardiology  08/26/22   Dohmeier, Dedra, MD Consulting Physician Neurology  08/26/22   Geralene Kaiser, MD Consulting Physician Psychiatry  04/09/24     Chief Complaint  Patient presents with   Hospitalization Follow-up     Subjective:  Jennifer Mayo  is a 48 y.o. female presents for hospital follow up after recent admission to behavioral health 02/16/2024, patient was transferred to [facility] ED for medical clearance due to tachycardia and stabilization on 02/16/2024, transferred back to [facility] on 10/29 for detox from alcohol.  Admission to [facility] rehab 02/26/2024.  Patient was discharged from [facility] was discharged on 03/27/2024 to home with partial hospitalization for intensive behavioral therapy 5 hours a day for 5 days a week.. Patients discharge summary from [facility] was not available at the time of appointment.  Patient reports next step is for intensive outpatient therapy to decrease to 3 hours a day for 3 days a week. Patient will follow-up with psychiatric provider in the [facility] area-Dr. 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  She has remained sober for over 50 days.  She is attending daily AA meetings and working to obtain a sponsor. She reports compliance with Wellbutrin  300 mg daily, BuSpar  5 mg 3 times daily, fluoxetine  40 mg daily, HCTZ 12.5 mg daily, losartan  50 mg twice daily, Depade 50 mg daily, Protonix  20 mg daily, Seroquel  50 mg nightly, propranolol  10 mg twice daily.  Recent Labs  Lab 04/09/24 0846   HGB 13.2  HCT 40.5  WBC 5.1  PLT 226.0      Latest Ref Rng & Units 04/09/2024    4:36 PM 02/18/2024    9:11 AM 11/30/2022    7:17 PM  CMP  Glucose 65 - 99 mg/dL 82  88  99   BUN 7 - 25 mg/dL 11  7  10    Creatinine 0.50 - 0.99 mg/dL 9.28  9.43  9.20   Sodium 135 - 146 mmol/L 140  143  143   Potassium 3.5 - 5.3 mmol/L 3.8  4.2  3.8   Chloride 98 - 110 mmol/L 103  100  106   CO2 20 - 32 mmol/L 28  26  24    Calcium  8.6 - 10.2 mg/dL 9.0  8.8  8.0   Total Protein 6.1 - 8.1 g/dL 5.8  6.4  6.5   Total Bilirubin 0.2 - 1.2 mg/dL 0.3  0.5  0.1   Alkaline Phos 38 - 126 U/L  92  93   AST 10 - 35 U/L 19  62  73   ALT 6 - 29 U/L 28  66  57       No results found.      04/09/2024    8:02 AM 02/25/2024    8:51 AM 02/23/2024    5:42 PM 02/19/2024   12:50 AM 02/18/2024   11:08 AM  Depression screen PHQ 2/9  Decreased Interest 1      Down, Depressed, Hopeless 1  PHQ - 2 Score 2      Altered sleeping 3      Tired, decreased energy 3      Change in appetite 3      Feeling bad or failure about yourself  2      Trouble concentrating 2      Moving slowly or fidgety/restless 0      Suicidal thoughts 0      PHQ-9 Score 15      Difficult doing work/chores Somewhat difficult         Information is confidential and restricted. Go to Review Flowsheets to unlock data.    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   Mild diastolic dysfunction    Narcolepsy    Neuromuscular disorder (HCC)    Obstructive sleep apnea    4 years ago retested and no longer has sleep apenea   Pleural effusion, bilateral    3-4 years    Sleep apnea    Solitary pulmonary nodule on lung CT    follow up CT recommended per medical records   Thyroid  disease    Vitamin B 12 deficiency    Past Surgical History:  Procedure Laterality Date   ABDOMINAL HYSTERECTOMY  2015   CHOLECYSTECTOMY     LAPAROSCOPIC ROUX-EN-Y GASTRIC BYPASS WITH HIATAL HERNIA REPAIR N/A 07/01/2016   Procedure: LAPAROSCOPIC ROUX-EN-Y GASTRIC BYPASS WITH HIATAL HERNIA REPAIR, UPPER ENDO;  Surgeon: Herlene Righter Kinsinger, MD;  Location: [facility];  Service: General;  Laterality: N/A;   UPPER GI ENDOSCOPY  07/01/2016   Procedure: UPPER GI ENDOSCOPY;  Surgeon: Herlene Righter Kinsinger, MD;  Location: [facility];  Service: General;;   Family History  Problem Relation Age of Onset   Brain cancer Mother    Seizures Mother    Stroke Mother    Breast cancer Mother 78   Diabetes Father    Hypertension Father    Hyperlipidemia Father    Hypertension Brother    Hypertension Maternal Grandmother    Colon cancer Maternal Grandmother    Atrial fibrillation Maternal Grandmother    Diabetes Paternal Grandmother    Hypertension Paternal Grandfather    Heart disease Paternal Grandfather    Allergies as of 04/09/2024       Reactions   Ranexa [ranolazine] Shortness Of Breath   Nsaids Other (See Comments)   Gastric bypass - ulcer risk   Verapamil Other (See Comments)   hypotension   Topamax [topiramate] Hives, Rash        Medication List        Accurate as of April 09, 2024 11:59 PM. If you have any questions, ask your nurse or doctor.  STOP taking these medications    amLODipine  5 MG tablet Commonly known  as: NORVASC  Stopped by: Charlies Bellini, DO   hydrOXYzine  25 MG tablet Commonly known as: ATARAX  Stopped by: Charlies Bellini, DO   melatonin 5 MG Tabs Stopped by: Charlies Bellini, DO   Melatonin 5 MG Tbdp Stopped by: Adrik Khim, DO   MULTIVITAMIN ADULT PO Stopped by: Charlies Bellini, DO   valACYclovir 1000 MG tablet Commonly known as: VALTREX Stopped by: Charlies Bellini, DO   Vitamin D  (Ergocalciferol ) 1.25 MG (50000 UNIT) Caps capsule Commonly known as: DRISDOL  Stopped by: Charlies Bellini, DO   Vitamin D3 1.25 MG (50000 UT) Caps Stopped by: Charlies Bellini, DO       TAKE these medications    buPROPion  150 MG 24 hr tablet Commonly known as: WELLBUTRIN  XL Take 2 tablets (300 mg total) by mouth every morning. What changed: how much to take Changed by: Charlies Bellini, DO   busPIRone  5 MG tablet Commonly known as: BUSPAR  Take 1 tablet (5 mg total) by mouth 3 (three) times daily.   Calcium  Carb-Cholecalciferol  600-10 MG-MCG Tabs Take 1 tablet by mouth every morning. What changed: Another medication with the same name was removed. Continue taking this medication, and follow the directions you see here. Changed by: Charlies Bellini, DO   FLUoxetine  40 MG capsule Commonly known as: PROZAC  Take 1 capsule (40 mg total) by mouth at bedtime.   gabapentin  100 MG capsule Commonly known as: NEURONTIN  Take 2 capsules (200 mg total) by mouth 3 (three) times daily.   hydrochlorothiazide  12.5 MG capsule Commonly known as: MICROZIDE  Take 1 capsule (12.5 mg total) by mouth every morning.   losartan  50 MG tablet Commonly known as: COZAAR  Take 1 tablet (50 mg total) by mouth 2 (two) times daily.   multivitamin with minerals Tabs tablet Take 1 tablet by mouth daily.   naltrexone  50 MG tablet Commonly known as: DEPADE Take 1 tablet (50 mg total) by mouth every morning.   pantoprazole  20 MG tablet Commonly known as: PROTONIX  Take 1 tablet (20 mg total) by mouth every morning.   propranolol  10  MG tablet Commonly known as: INDERAL  Take 1 tablet (10 mg total) by mouth 2 (two) times daily.   QUEtiapine  50 MG tablet Commonly known as: SEROQUEL  Take 1 tablet (50 mg total) by mouth at bedtime. What changed:  when to take this reasons to take this Changed by: Charlies Bellini, DO   thiamine  100 MG tablet Commonly known as: VITAMIN B1 Take 100 mg by mouth every morning.        All past medical history, surgical history, allergies, family history, immunizations and medications were updated in the EMR today and reviewed under the history and medication portions of their EMR.      ROS: Negative, with the exception of above mentioned in HPI   Objective:  BP 132/80   Pulse (!) 52   Temp 97.6 F (36.4 C) (Temporal)   Ht 5' 5 (1.651 m)   Wt 175 lb 9.6 oz (79.7 kg)   SpO2 98%   BMI 29.22 kg/m  Body mass index is 29.22 kg/m. Physical Exam Vitals and nursing note reviewed.  Constitutional:      General: She is not in acute distress.    Appearance: Normal appearance. She is not ill-appearing, toxic-appearing or diaphoretic.  HENT:     Head: Normocephalic and atraumatic.  Eyes:     General: No scleral icterus.       Right  eye: No discharge.        Left eye: No discharge.     Extraocular Movements: Extraocular movements intact.     Conjunctiva/sclera: Conjunctivae normal.     Pupils: Pupils are equal, round, and reactive to light.  Cardiovascular:     Rate and Rhythm: Normal rate and regular rhythm.     Heart sounds: No murmur heard. Pulmonary:     Effort: Pulmonary effort is normal. No respiratory distress.     Breath sounds: Normal breath sounds. No wheezing, rhonchi or rales.  Musculoskeletal:     Right lower leg: No edema.     Left lower leg: No edema.  Skin:    General: Skin is warm.     Findings: No rash.  Neurological:     Mental Status: She is alert and oriented to person, place, and time. Mental status is at baseline.     Motor: No weakness.     Gait:  Gait normal.  Psychiatric:        Mood and Affect: Mood normal.        Behavior: Behavior normal.        Thought Content: Thought content normal.        Judgment: Judgment normal.     Assessment/Plan: Jennifer Mayo is a 48 y.o. female present for OV for Hospital discharge follow up Alcohol use disorder, moderate, in controlled environment (HCC) (Primary) Continue Depade 50 mg daily Continue AA meetings routinely Continue intensive outpatient therapy - CBC - Comprehensive metabolic panel with GFR - Vitamin B1 - Vitamin D  (25 hydroxy) - B12 and Folate Panel  Major depressive disorder, recurrent episode, moderate (HCC)/Alcohol-induced insomnia (HCC) Patient is to follow with psychiatric team for any future refills.  Did provide 30-day prescription that her time to get into her psychiatric team Continue Seroquel  50 mg nightly Continue propranolol  10 mg twice daily Continue gabapentin  200 mg 3 times daily Continue Prozac  40 mg nightly Continue BuSpar  5 mg 3 times daily Continue Wellbutrin  300 mg daily   Hypertension/overweight Stable Continue HCTZ 12.5 mg daily Continue losartan  50 mg twice daily Low-sodium diet and routine exercise recommended  Thiamine  deficiency/B12 deficiency B1 and B12 collected Continue supplementation Vitamin D  deficiency Vitamin D  collected Thrombocytopenia CBC Influenza vaccine needed Declined Need for vaccination for pneumococcus Declined Breast cancer screening by mammogram - MM 3D SCREENING MAMMOGRAM BILATERAL BREAST; Future  Reviewed expectations re: course of current medical issues. Discussed self-management of symptoms. Outlined signs and symptoms indicating need for more acute intervention. Patient verbalized understanding and all questions were answered. Patient received an After-Visit Summary. Any changes in medications were reviewed and patient was provided with updated med list with their AVS.    Return in about 12 weeks (around  07/02/2024) for Routine chronic condition follow-up.   Orders Placed This Encounter  Procedures   MM 3D SCREENING MAMMOGRAM BILATERAL BREAST   Cologuard   CBC   Comprehensive metabolic panel with GFR   Vitamin B1   Vitamin D  (25 hydroxy)   B12 and Folate Panel   Extra Specimen   Vitamin B1   I personally spent a total of 56 minutes in the care of the patient today including preparing to see the patient, getting/reviewing separately obtained history, performing a medically appropriate exam/evaluation, counseling and educating, placing orders, referring and communicating with other health care professionals, documenting clinical information in the EHR, independently interpreting results, communicating results, and coordinating care.   Note is dictated utilizing voice recognition software. Although  note has been proof read prior to signing, occasional typographical errors still can be missed. If any questions arise, please do not hesitate to call for verification.   electronically signed by:  Charlies Bellini, DO  [facility] [place]       [1]  Allergies Allergen Reactions   Ranexa [Ranolazine] Shortness Of Breath   Nsaids Other (See Comments)    Gastric bypass - ulcer risk   Verapamil Other (See Comments)    hypotension   Topamax [Topiramate] Hives and Rash   "

## 2024-04-09 NOTE — Patient Instructions (Signed)
 Return in about 12 weeks (around 07/02/2024) for Routine chronic condition follow-up.        Great to see you today.  I have refilled the medication(s) we provide.   If labs were collected or images ordered, we will inform you of  results once we have received them and reviewed. We will contact you either by echart message, or telephone call.  Please give ample time to the testing facility, and our office to run,  receive and review results. Please do not call inquiring of results, even if you can see them in your chart. We will contact you as soon as we are able. If it has been over 1 week since the test was completed, and you have not yet heard from us , then please call us .    - echart message- for normal results that have been seen by the patient already.   - telephone call: abnormal results or if patient has not viewed results in their echart.  If a referral to a specialist was entered for you, please call us  in 2 weeks if you have not heard from the specialist office to schedule.

## 2024-04-10 LAB — COMPREHENSIVE METABOLIC PANEL WITH GFR
AG Ratio: 1.8 (calc) (ref 1.0–2.5)
ALT: 28 U/L (ref 6–29)
AST: 19 U/L (ref 10–35)
Albumin: 3.7 g/dL (ref 3.6–5.1)
Alkaline phosphatase (APISO): 67 U/L (ref 31–125)
BUN: 11 mg/dL (ref 7–25)
CO2: 28 mmol/L (ref 20–32)
Calcium: 9 mg/dL (ref 8.6–10.2)
Chloride: 103 mmol/L (ref 98–110)
Creat: 0.71 mg/dL (ref 0.50–0.99)
Globulin: 2.1 g/dL (ref 1.9–3.7)
Glucose, Bld: 82 mg/dL (ref 65–99)
Potassium: 3.8 mmol/L (ref 3.5–5.3)
Sodium: 140 mmol/L (ref 135–146)
Total Bilirubin: 0.3 mg/dL (ref 0.2–1.2)
Total Protein: 5.8 g/dL — ABNORMAL LOW (ref 6.1–8.1)
eGFR: 104 mL/min/1.73m2

## 2024-04-10 LAB — B12 AND FOLATE PANEL
Folate: 16.7 ng/mL
Vitamin B-12: 246 pg/mL (ref 200–1100)

## 2024-04-10 LAB — VITAMIN D 25 HYDROXY (VIT D DEFICIENCY, FRACTURES): Vit D, 25-Hydroxy: 34 ng/mL (ref 30–100)

## 2024-04-12 ENCOUNTER — Ambulatory Visit: Payer: Self-pay | Admitting: Family Medicine

## 2024-04-12 DIAGNOSIS — E663 Overweight: Secondary | ICD-10-CM | POA: Insufficient documentation

## 2024-04-12 DIAGNOSIS — I10 Essential (primary) hypertension: Secondary | ICD-10-CM | POA: Insufficient documentation

## 2024-04-13 LAB — EXTRA SPECIMEN

## 2024-04-13 LAB — VITAMIN B1

## 2024-04-23 ENCOUNTER — Telehealth: Payer: Self-pay | Admitting: Family Medicine

## 2024-04-23 NOTE — Telephone Encounter (Signed)
 Spoke with pharmacy. Medication confirmed.

## 2024-04-23 NOTE — Telephone Encounter (Signed)
 Copied from CRM [id number]. Topic: Clinical - Prescription Issue >> Apr 23, 2024  9:08 AM Adelita BRAVO wrote: Reason for CRM: Yumna with [facility] called in to make sure patient's pantoprazole  (PROTONIX ) 20 MG tablet is correct. Yumna's system was flagging this medication and just wanted to make sure everything was correct on her end. Callback number [phone].

## 2024-05-07 ENCOUNTER — Telehealth: Payer: Self-pay

## 2024-05-07 NOTE — Telephone Encounter (Signed)
 MyChart message sent regarding Flu Vaccine.

## 2024-05-17 ENCOUNTER — Other Ambulatory Visit: Payer: Self-pay

## 2024-05-22 ENCOUNTER — Other Ambulatory Visit: Payer: Self-pay | Admitting: Family Medicine

## 2024-05-25 ENCOUNTER — Telehealth (HOSPITAL_COMMUNITY): Payer: Self-pay | Admitting: Psychiatry

## 2024-05-26 NOTE — Telephone Encounter (Signed)
 Medication management - Messaage left for patient to please contact our [facility] office to schedule a new appointment and then Dr. Geralene would be wiling to provide a bridge of requested refill of Prozac  medication until seen.

## 2024-07-02 ENCOUNTER — Ambulatory Visit: Admitting: Family Medicine

## 2024-07-06 ENCOUNTER — Ambulatory Visit: Admitting: Family Medicine
